# Patient Record
Sex: Male | Born: 1975 | Race: White | Hispanic: No | Marital: Married | Smoking: Former smoker
Health system: Southern US, Community
[De-identification: ages and names within clinical notes are randomized; demographics above are authoritative.]

## PROBLEM LIST (undated history)

## (undated) DIAGNOSIS — J189 Pneumonia, unspecified organism: Secondary | ICD-10-CM

## (undated) DIAGNOSIS — U070 Vaping-related disorder: Secondary | ICD-10-CM

## (undated) DIAGNOSIS — Z87891 Personal history of nicotine dependence: Secondary | ICD-10-CM

## (undated) DIAGNOSIS — G4730 Sleep apnea, unspecified: Secondary | ICD-10-CM

## (undated) DIAGNOSIS — C801 Malignant (primary) neoplasm, unspecified: Secondary | ICD-10-CM

## (undated) HISTORY — DX: Personal history of nicotine dependence: Z87.891

## (undated) HISTORY — PX: WISDOM TOOTH EXTRACTION: SHX21

## (undated) HISTORY — DX: Sleep apnea, unspecified: G47.30

## (undated) HISTORY — DX: Vaping-related disorder: U07.0

---

## 2006-04-09 ENCOUNTER — Emergency Department (HOSPITAL_COMMUNITY): Admission: EM | Admit: 2006-04-09 | Discharge: 2006-04-09 | Payer: Self-pay | Admitting: Family Medicine

## 2020-01-09 DIAGNOSIS — C801 Malignant (primary) neoplasm, unspecified: Secondary | ICD-10-CM

## 2020-01-09 HISTORY — DX: Malignant (primary) neoplasm, unspecified: C80.1

## 2020-02-22 ENCOUNTER — Other Ambulatory Visit: Payer: Self-pay | Admitting: Nurse Practitioner

## 2020-02-22 ENCOUNTER — Telehealth: Payer: Self-pay

## 2020-02-22 ENCOUNTER — Other Ambulatory Visit: Payer: Self-pay

## 2020-02-22 ENCOUNTER — Ambulatory Visit (INDEPENDENT_AMBULATORY_CARE_PROVIDER_SITE_OTHER): Payer: 59 | Admitting: Nurse Practitioner

## 2020-02-22 ENCOUNTER — Telehealth: Payer: Self-pay | Admitting: Nurse Practitioner

## 2020-02-22 ENCOUNTER — Encounter: Payer: Self-pay | Admitting: Nurse Practitioner

## 2020-02-22 VITALS — BP 122/78 | HR 60 | Temp 97.5°F | Ht 71.0 in | Wt 272.0 lb

## 2020-02-22 DIAGNOSIS — R5383 Other fatigue: Secondary | ICD-10-CM

## 2020-02-22 DIAGNOSIS — F17200 Nicotine dependence, unspecified, uncomplicated: Secondary | ICD-10-CM

## 2020-02-22 DIAGNOSIS — Z823 Family history of stroke: Secondary | ICD-10-CM

## 2020-02-22 DIAGNOSIS — Z87898 Personal history of other specified conditions: Secondary | ICD-10-CM | POA: Diagnosis not present

## 2020-02-22 DIAGNOSIS — R918 Other nonspecific abnormal finding of lung field: Secondary | ICD-10-CM

## 2020-02-22 DIAGNOSIS — R9389 Abnormal findings on diagnostic imaging of other specified body structures: Secondary | ICD-10-CM | POA: Diagnosis not present

## 2020-02-22 DIAGNOSIS — Z801 Family history of malignant neoplasm of trachea, bronchus and lung: Secondary | ICD-10-CM

## 2020-02-22 DIAGNOSIS — Z0000 Encounter for general adult medical examination without abnormal findings: Secondary | ICD-10-CM

## 2020-02-22 DIAGNOSIS — R911 Solitary pulmonary nodule: Secondary | ICD-10-CM

## 2020-02-22 DIAGNOSIS — R030 Elevated blood-pressure reading, without diagnosis of hypertension: Secondary | ICD-10-CM

## 2020-02-22 DIAGNOSIS — R002 Palpitations: Secondary | ICD-10-CM

## 2020-02-22 DIAGNOSIS — Z820 Family history of epilepsy and other diseases of the nervous system: Secondary | ICD-10-CM

## 2020-02-22 NOTE — Telephone Encounter (Signed)
Received call from [facility] about results. Pt had chest xray today. 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 A CT Chest is recommended.

## 2020-02-22 NOTE — Addendum Note (Signed)
Addended by: [name] on: 02/22/2020 08:11 PM   Modules accepted: Orders

## 2020-02-22 NOTE — Patient Instructions (Addendum)
Chest x-ray today at [facility] We will call you with lab and x-ray results Follow-up pending x-ray and lab results Vaping cessation encouraged   Preventive Care 58-43 Years Old, Male Preventive care refers to lifestyle choices and visits with your health care provider that can promote health and wellness. This includes:  A yearly physical exam. This is also called an annual wellness visit.  Regular dental and eye exams.  Immunizations.  Screening for certain conditions.  Healthy lifestyle choices, such as: ? Eating a healthy diet. ? Getting regular exercise. ? Not using drugs or products that contain nicotine and tobacco. ? Limiting alcohol use. What can I expect for my preventive care visit? Physical exam Your health care provider will check your:  Height and weight. These may be used to calculate your BMI (body mass index). BMI is a measurement that tells if you are at a healthy weight.  Heart rate and blood pressure.  Body temperature.  Skin for abnormal spots. Counseling Your health care provider may ask you questions about your:  Past medical problems.  Family's medical history.  Alcohol, tobacco, and drug use.  Emotional well-being.  Home life and relationship well-being.  Sexual activity.  Diet, exercise, and sleep habits.  Work and work Statistician.  Access to firearms. What immunizations do I need? Vaccines are usually given at various ages, according to a schedule. Your health care provider will recommend vaccines for you based on your age, medical history, and lifestyle or other factors, such as travel or where you work.   What tests do I need? Blood tests  Lipid and cholesterol levels. These may be checked every 5 years, or more often if you are over 63 years old.  Hepatitis C test.  Hepatitis B test. Screening  Lung cancer screening. You may have this screening every year starting at age 27 if you have a 30-pack-year history of  smoking and currently smoke or have quit within the past 15 years.  Prostate cancer screening. Recommendations will vary depending on your family history and other risks.  Genital exam to check for testicular cancer or hernias.  Colorectal cancer screening. ? All adults should have this screening starting at age 76 and continuing until age 21. ? Your health care provider may recommend screening at age 21 if you are at increased risk. ? You will have tests every 1-10 years, depending on your results and the type of screening test.  Diabetes screening. ? This is done by checking your blood sugar (glucose) after you have not eaten for a while (fasting). ? You may have this done every 1-3 years.  STD (sexually transmitted disease) testing, if you are at risk. Follow these instructions at home: Eating and drinking  Eat a diet that includes fresh fruits and vegetables, whole grains, lean protein, and low-fat dairy products.  Take vitamin and mineral supplements as recommended by your health care provider.  Do not drink alcohol if your health care provider tells you not to drink.  If you drink alcohol: ? Limit how much you have to 0-2 drinks a day. ? Be aware of how much alcohol is in your drink. In the [place] one drink equals one 12 oz bottle of beer (355 mL), one 5 oz glass of wine (148 mL), or one 1 oz glass of hard liquor (44 mL).   Lifestyle  Take daily care of your teeth and gums. Brush your teeth every morning and night with fluoride toothpaste. Floss one time each  day.  Stay active. Exercise for at least 30 minutes 5 or more days each week.  Do not use any products that contain nicotine or tobacco, such as cigarettes, e-cigarettes, and chewing tobacco. If you need help quitting, ask your health care provider.  Do not use drugs.  If you are sexually active, practice safe sex. Use a condom or other form of protection to prevent STIs (sexually transmitted infections).  If told  by your health care provider, take low-dose aspirin daily starting at age 4.  Find healthy ways to cope with stress, such as: ? Meditation, yoga, or listening to music. ? Journaling. ? Talking to a trusted person. ? Spending time with friends and family. Safety  Always wear your seat belt while driving or riding in a vehicle.  Do not drive: ? If you have been drinking alcohol. Do not ride with someone who has been drinking. ? When you are tired or distracted. ? While texting.  Wear a helmet and other protective equipment during sports activities.  If you have firearms in your house, make sure you follow all gun safety procedures. What's next?  Go to your health care provider once a year for an annual wellness visit.  Ask your health care provider how often you should have your eyes and teeth checked.  Stay up to date on all vaccines. This information is not intended to replace advice given to you by your health care provider. Make sure you discuss any questions you have with your health care provider. Document Revised: 09/23/2018 Document Reviewed: 12/19/2017 Elsevier Patient Education  2021 [place] Maintenance, Male Adopting a healthy lifestyle and getting preventive care are important in promoting health and wellness. Ask your health care provider about:  The right schedule for you to have regular tests and exams.  Things you can do on your own to prevent diseases and keep yourself healthy. What should I know about diet, weight, and exercise? Eat a healthy diet  Eat a diet that includes plenty of vegetables, fruits, low-fat dairy products, and lean protein.  Do not eat a lot of foods that are high in solid fats, added sugars, or sodium.   Maintain a healthy weight Body mass index (BMI) is a measurement that can be used to identify possible weight problems. It estimates body fat based on height and weight. Your health care provider can help determine your BMI  and help you achieve or maintain a healthy weight. Get regular exercise Get regular exercise. This is one of the most important things you can do for your health. Most adults should:  Exercise for at least 150 minutes each week. The exercise should increase your heart rate and make you sweat (moderate-intensity exercise).  Do strengthening exercises at least twice a week. This is in addition to the moderate-intensity exercise.  Spend less time sitting. Even light physical activity can be beneficial. Watch cholesterol and blood lipids Have your blood tested for lipids and cholesterol at 43 years of age, then have this test every 5 years. You may need to have your cholesterol levels checked more often if:  Your lipid or cholesterol levels are high.  You are older than 43 years of age.  You are at high risk for heart disease. What should I know about cancer screening? Many types of cancers can be detected early and may often be prevented. Depending on your health history and family history, you may need to have cancer screening at various ages. This may  include screening for:  Colorectal cancer.  Prostate cancer.  Skin cancer.  Lung cancer. What should I know about heart disease, diabetes, and high blood pressure? Blood pressure and heart disease  High blood pressure causes heart disease and increases the risk of stroke. This is more likely to develop in people who have high blood pressure readings, are of African descent, or are overweight.  Talk with your health care provider about your target blood pressure readings.  Have your blood pressure checked: ? Every 3-5 years if you are 72-58 years of age. ? Every year if you are 73 years old or older.  If you are between the ages of 65 and 32 and are a current or former smoker, ask your health care provider if you should have a one-time screening for abdominal aortic aneurysm (AAA). Diabetes Have regular diabetes screenings. This  checks your fasting blood sugar level. Have the screening done:  Once every three years after age 58 if you are at a normal weight and have a low risk for diabetes.  More often and at a younger age if you are overweight or have a high risk for diabetes. What should I know about preventing infection? Hepatitis B If you have a higher risk for hepatitis B, you should be screened for this virus. Talk with your health care provider to find out if you are at risk for hepatitis B infection. Hepatitis C Blood testing is recommended for:  Everyone born from [birth year] through 1963.  Anyone with known risk factors for hepatitis C. Sexually transmitted infections (STIs)  You should be screened each year for STIs, including gonorrhea and chlamydia, if: ? You are sexually active and are younger than 43 years of age. ? You are older than 43 years of age and your health care provider tells you that you are at risk for this type of infection. ? Your sexual activity has changed since you were last screened, and you are at increased risk for chlamydia or gonorrhea. Ask your health care provider if you are at risk.  Ask your health care provider about whether you are at high risk for HIV. Your health care provider may recommend a prescription medicine to help prevent HIV infection. If you choose to take medicine to prevent HIV, you should first get tested for HIV. You should then be tested every 3 months for as long as you are taking the medicine. Follow these instructions at home: Lifestyle  Do not use any products that contain nicotine or tobacco, such as cigarettes, e-cigarettes, and chewing tobacco. If you need help quitting, ask your health care provider.  Do not use street drugs.  Do not share needles.  Ask your health care provider for help if you need support or information about quitting drugs. Alcohol use  Do not drink alcohol if your health care provider tells you not to drink.  If you drink  alcohol: ? Limit how much you have to 0-2 drinks a day. ? Be aware of how much alcohol is in your drink. In the [place] one drink equals one 12 oz bottle of beer (355 mL), one 5 oz glass of wine (148 mL), or one 1 oz glass of hard liquor (44 mL). General instructions  Schedule regular health, dental, and eye exams.  Stay current with your vaccines.  Tell your health care provider if: ? You often feel depressed. ? You have ever been abused or do not feel safe at home. Summary  Adopting a  healthy lifestyle and getting preventive care are important in promoting health and wellness.  Follow your health care provider's instructions about healthy diet, exercising, and getting tested or screened for diseases.  Follow your health care provider's instructions on monitoring your cholesterol and blood pressure. This information is not intended to replace advice given to you by your health care provider. Make sure you discuss any questions you have with your health care provider. Document Revised: 12/18/2017 Document Reviewed: 12/18/2017 Elsevier Patient Education  [place] Cigarette Information  Electronic cigarettes, or e-cigarettes, are battery-operated devices that deliver nicotine-a very addictive drug-to the body. They come in many shapes, including in the shape of a cigarette, pipe, pen, and even a USB memory stick. E-cigarettes have a cartridge that contains a liquid form of nicotine. When a person uses the device, the liquid heats up. It then becomes a vapor. Inhaling this vapor is called vaping. Nicotine is thought to increase your risk for certain types of cancer. In addition to nicotine, e-cigarettes may contain other harmful and cancer-causing chemicals, including:  Formaldehyde.  Acetaldehyde.  Heavy metals.  Ultra-fine particles that can get inhaled deep into the lungs.  Chemical colorings and flavorings. It is not clear how much nicotine you get when  vaping, and it is hard to know what chemicals are in the vaping liquids. The health effects of vaping are not completely known, but you should be aware of the possible dangers of using these products. Some people may use e-cigarettes in order to quit smoking tobacco. However, this has not been proven to work, and the Transport planner (FDA) has not approved e-cigarettes for this purpose. How can using electronic cigarettes affect me?  You may be at risk for developing a dangerous lung disease. There are reports of an increasing number of cases involving serious lung problems, and even death, associated with e-cigarette use. Your risk may be even higher if you: ? Buy e-cigarettes or vaping oils off the street. ? Add any substances to the e-cigarettes that are not intended by the manufacturer.  Vaping may make you crave nicotine. Nicotine does the following: ? Changes your blood sugar levels. ? Increases your heart rate, blood pressure, and breathing rate. ? Increases your risk of developing blood clots (hypercoagulable state) and diabetes. ? Increases your risk of gum disease that may lead to losing teeth.  If you smoke e-cigarettes, you may be more likely to start smoking or to smoke more tobacco cigarettes.  Becoming addicted to nicotine may make your brain more sensitive to other addictive drugs. You may move to other addictive substances.  You may be in danger of overdosing on nicotine. Nicotine poisoning can cause nausea, vomiting, seizures, and trouble breathing.  An e-cigarette may explode and cause fires and burn injuries.  If you are pregnant, the nicotine in e-cigarettes may be harmful to your baby. Nicotine can cause: ? Brain or lung problems for your baby. ? Your baby to be born too early. ? Your baby to be born with a low birth weight.  Vaping has also been linked to decreases in memory and attention span in children and teens. What actions can I take to stop  vaping? If you can, stop vaping on your own before you become addicted to nicotine. If you need help quitting, ask your health care provider. There are three effective ways to fight nicotine addiction:  Nicotine replacement therapy. Using nicotine gum or a nicotine patch blocks your craving for  nicotine. Over time, you can reduce the amount of nicotine you use until you can stop using nicotine completely without having cravings.  Prescription medicines approved to fight nicotine addiction. These stop nicotine cravings or block the effects of nicotine.  Behavioral therapy. This may include: ? A self-help smoking cessation program. ? Individual or group therapy. ? A smoking cessation support group. There are several national programs to help you quit smoking or vaping. These include:  Text message programs, such as SmokefreeTXT.  Apps for mobile phones, including the free quitSTART app.  Hotlines, such as 1-800-QUIT-NOW 612-086-2375). Where to find support You can get support at these sites:  [place].S. Department of Health and Human Services: https://smokefree.gov  American Lung Association: www.lung.org Where to find more information Learn more about e-cigarettes from:  [employer] on Drug Abuse: motorcyclefax.com  Centers for Disease Control and Prevention: http://www.wolf.info/ Summary  E-cigarettes can cause nicotine addiction.  E-cigarettes are not approved as a way to stop smoking. They are not a risk-free alternative to smoking tobacco.  There are reports of an increasing number of cases involving serious lung problems, and even death, associated with e-cigarette use.  If you can stop vaping on your own, do it before you become addicted to nicotine. If you need help quitting, ask your health care provider.  There are various methods and programs that can help you stop smoking or vaping. This information is not intended to replace advice given to you by your health care  provider. Make sure you discuss any questions you have with your health care provider. Document Revised: 04/22/2018 Document Reviewed: 03/07/2018 Elsevier Patient Education  2021 [place].  Chest X-Ray A chest X-ray is a test that uses radiation to create pictures of the organs in your chest, including the lungs, the heart, and the ribs. Chest X-rays are used to look for many health conditions, including heart failure, pneumonia, tuberculosis, rib fractures, breathing disorders, and cancer. They may be used to diagnose chest pain, constant coughing, or trouble breathing. Tell a health care provider about:  Any allergies you have.  All medicines you are taking, including vitamins, herbs, eye drops, creams, and over-the-counter medicines.  Any surgeries you have had.  Any medical conditions you have.  Whether you are pregnant or may be pregnant. What are the risks? Getting a chest X-ray is a safe procedure. However, you will be exposed to a small amount of radiation. Being exposed to too much radiation over a lifetime can increase the risk of cancer. This risk is small, but it may occur if you have many X-rays throughout your life. What happens before the procedure?  You may be asked to remove glasses, jewelry, and any other metal objects.  You will be asked to undress from the waist up. You may be given a hospital gown to wear.  You may be asked to wear a protective lead apron to protect other parts of your body from radiation. What happens during the procedure?  You will be asked to stand still as each picture is taken. This ensures that good pictures are taken.  You will be asked to take a deep breath and hold it for a few seconds.  The X-ray machine will create a picture of your chest using a tiny burst of radiation. This is painless.  More pictures may be taken from other angles. Typically, one picture will be taken while you face the X-ray camera, and another picture will  be taken from the side while  you stand. If you cannot stand, you may be asked to lie down. The procedure may vary among health care providers and hospitals.   What can I expect after procedure?  The X-ray will be reviewed by your health care provider or an X-ray specialist (radiologist).  You may return to your normal, everyday life, including diet, activities, and medicines, unless your health care provider tells you not to do that.  It is up to you to get your test results. Ask your health care provider, or the department that is doing the procedure, when your results will be ready.  Your health care provider will tell you if you need more tests or a follow-up exam. Keep all follow-up visits. This is important. Summary  A chest X-ray is a safe, painless test that uses radiation to create pictures of the organs inside your chest, including the lungs, heart, and ribs.  You will need to undress from the waist up and remove jewelry and metal objects before the procedure.  You will be exposed to a small amount of radiation during the procedure.  The X-ray machine will take one or more pictures of your chest while you remain as still as possible.  Later, a health care provider or specialist will review the test results with you. This information is not intended to replace advice given to you by your health care provider. Make sure you discuss any questions you have with your health care provider. Document Revised: 03/27/2019 Document Reviewed: 03/27/2019 Elsevier Patient Education  2021 [place].  Stroke Prevention Some medical conditions and behaviors are associated with a higher chance of having a stroke. You can help prevent a stroke by making nutrition, lifestyle, and other changes, including managing any medical conditions you may have. What nutrition changes can be made?  Eat healthy foods. You can do this by: ? Choosing foods high in fiber, such as fresh fruits and vegetables  and whole grains. ? Eating at least 5 or more servings of fruits and vegetables a day. Try to fill half of your plate at each meal with fruits and vegetables. ? Choosing lean protein foods, such as lean cuts of meat, poultry without skin, fish, tofu, beans, and nuts. ? Eating low-fat dairy products. ? Avoiding foods that are high in salt (sodium). This can help lower blood pressure. ? Avoiding foods that have saturated fat, trans fat, and cholesterol. This can help prevent high cholesterol. ? Avoiding processed and premade foods.  Follow your health care provider's specific guidelines for losing weight, controlling high blood pressure (hypertension), lowering high cholesterol, and managing diabetes. These may include: ? Reducing your daily calorie intake. ? Limiting your daily sodium intake to 1,500 milligrams (mg). ? Using only healthy fats for cooking, such as olive oil, canola oil, or sunflower oil. ? Counting your daily carbohydrate intake.   What lifestyle changes can be made?  Maintain a healthy weight. Talk to your health care provider about your ideal weight.  Get at least 30 minutes of moderate physical activity at least 5 days a week. Moderate activity includes brisk walking, biking, and swimming.  Do not use any products that contain nicotine or tobacco, such as cigarettes and e-cigarettes. If you need help quitting, ask your health care provider. It may also be helpful to avoid exposure to secondhand smoke.  Limit alcohol intake to no more than 1 drink a day for nonpregnant women and 2 drinks a day for men. One drink equals 12 oz of beer,  5 oz of wine, or 1 oz of hard liquor.  Stop any illegal drug use.  Avoid taking birth control pills. Talk to your health care provider about the risks of taking birth control pills if: ? You are over 15 years old. ? You smoke. ? You get migraines. ? You have ever had a blood clot. What other changes can be made?  Manage your cholesterol  levels. ? Eating a healthy diet is important for preventing high cholesterol. If cholesterol cannot be managed through diet alone, you may also need to take medicines. ? Take any prescribed medicines to control your cholesterol as told by your health care provider.  Manage your diabetes. ? Eating a healthy diet and exercising regularly are important parts of managing your blood sugar. If your blood sugar cannot be managed through diet and exercise, you may need to take medicines. ? Take any prescribed medicines to control your diabetes as told by your health care provider.  Control your hypertension. ? To reduce your risk of stroke, try to keep your blood pressure below 130/80. ? Eating a healthy diet and exercising regularly are an important part of controlling your blood pressure. If your blood pressure cannot be managed through diet and exercise, you may need to take medicines. ? Take any prescribed medicines to control hypertension as told by your health care provider. ? Ask your health care provider if you should monitor your blood pressure at home. ? Have your blood pressure checked every year, even if your blood pressure is normal. Blood pressure increases with age and some medical conditions.  Get evaluated for sleep disorders (sleep apnea). Talk to your health care provider about getting a sleep evaluation if you snore a lot or have excessive sleepiness.  Take over-the-counter and prescription medicines only as told by your health care provider. Aspirin or blood thinners (antiplatelets or anticoagulants) may be recommended to reduce your risk of forming blood clots that can lead to stroke.  Make sure that any other medical conditions you have, such as atrial fibrillation or atherosclerosis, are managed. What are the warning signs of a stroke? The warning signs of a stroke can be easily remembered as BEFAST.  B is for balance. Signs include: ? Dizziness. ? Loss of balance or  coordination. ? Sudden trouble walking.  E is for eyes. Signs include: ? A sudden change in vision. ? Trouble seeing.  F is for face. Signs include: ? Sudden weakness or numbness of the face. ? The face or eyelid drooping to one side.  A is for arms. Signs include: ? Sudden weakness or numbness of the arm, usually on one side of the body.  S is for speech. Signs include: ? Trouble speaking (aphasia). ? Trouble understanding.  T is for time. ? These symptoms may represent a serious problem that is an emergency. Do not wait to see if the symptoms will go away. Get medical help right away. Call your local emergency services (911 in the [place] Do not drive yourself to the hospital.  Other signs of stroke may include: ? A sudden, severe headache with no known cause. ? Nausea or vomiting. ? Seizure. Where to find more information For more information, visit:  American Stroke Association: www.strokeassociation.org  National Stroke Association: www.stroke.org Summary  You can prevent a stroke by eating healthy, exercising, not smoking, limiting alcohol intake, and managing any medical conditions you may have.  Do not use any products that contain nicotine or tobacco, such as cigarettes  and e-cigarettes. If you need help quitting, ask your health care provider. It may also be helpful to avoid exposure to secondhand smoke.  Remember BEFAST for warning signs of stroke. Get help right away if you or a loved one has any of these signs. This information is not intended to replace advice given to you by your health care provider. Make sure you discuss any questions you have with your health care provider. Document Revised: 12/07/2016 Document Reviewed: 01/31/2016 Elsevier Patient Education  2021 [employer].  Syncope Syncope is when you pass out (faint) for a short time. It is caused by a sudden decrease in blood flow to the brain. Signs that you may be about to pass out  include: Feeling dizzy or light-headed. Feeling sick to your stomach (nauseous). Seeing all white or all black. Having cold, clammy skin. If you pass out, get help right away. Call your local emergency services (911 in the [place] Do not drive yourself to the hospital. Follow these instructions at home: Watch for any changes in your symptoms. Take these actions to stay safe and help with your symptoms: Lifestyle Do not drive, use machinery, or play sports until your doctor says it is okay. Do not drink alcohol. Do not use any products that contain nicotine or tobacco, such as cigarettes and e-cigarettes. If you need help quitting, ask your doctor. Drink enough fluid to keep your pee (urine) pale yellow. General instructions Take over-the-counter and prescription medicines only as told by your doctor. If you are taking blood pressure or heart medicine, sit up and stand up slowly. Spend a few minutes getting ready to sit and then stand. This can help you feel less dizzy. Have someone stay with you until you feel stable. If you start to feel like you might pass out, lie down right away and raise (elevate) your feet above the level of your heart. Breathe deeply and steadily. Wait until all of the symptoms are gone. Keep all follow-up visits as told by your doctor. This is important. Get help right away if: You have a very bad headache. You pass out once or more than once. You have pain in your chest, belly, or back. You have a very fast or uneven heartbeat (palpitations). It hurts to breathe. You are bleeding from your mouth or your bottom (rectum). You have black or tarry poop (stool). You have jerky movements that you cannot control (seizure). You are confused. You have trouble walking. You are very weak. You have vision problems. These symptoms may be an emergency. Do not wait to see if the symptoms will go away. Get medical help right away. Call your local emergency services (911 in  the [place] Do not drive yourself to the hospital. Summary Syncope is when you pass out (faint) for a short time. It is caused by a sudden decrease in blood flow to the brain. Signs that you may be about to faint include feeling dizzy, light-headed, or sick to your stomach, seeing all white or all black, or having cold, clammy skin. If you start to feel like you might pass out, lie down right away and raise (elevate) your feet above the level of your heart. Breathe deeply and steadily. Wait until all of the symptoms are gone. This information is not intended to replace advice given to you by your health care provider. Make sure you discuss any questions you have with your health care provider. Document Revised: 02/04/2019 Document Reviewed: 02/06/2017 Elsevier Patient Education  2021  [employer].

## 2020-02-22 NOTE — Progress Notes (Addendum)
New Patient Office Visit  Subjective:  Patient ID: Jerry [name], male    DOB: 11/27/1975  Age: 45 y.o. MRN: [id number]  CC:  Chief Complaint  Patient presents with  . New to establish    Had employer physical at work and was told to follow up with [place] on Chest XRAY that they had provided him the results with stating that he had a 4.5cm mass in the right lung.    HPI Jerry [name] presents to establish care.He is accompanied by his spouse. He recently had a physical exam at his place of employment that included a routine chest x-ray. He was notified 1-week ago that chest x-ray was abnormal. Pt states he was told a 4.5 cm mass was noted to right lung. He is a former long-time cigarette smoker and current daily nicotine vape user. He has a family history of lung cancer (paternal aunt). He tells me he also has experienced intermittent palpitations with "chest tightness" at rest that last a few seconds. He denies dyspnea during episodes. His spouse states they have been increasing in frequency. He states he has had two syncopal episodes during bowel movements over the past couple years. [place] tells me that his father has had several strokes and tias beginning around age 29.    .  Family History  Problem Relation Age of Onset  . Stroke Father   . Hypertension Father   . Hyperlipidemia Father   . Arrhythmia Father   . Lung cancer Paternal Aunt     Social History   Socioeconomic History  . Marital status: Married    Spouse name: Not on file  . Number of children: Not on file  . Years of education: Not on file  . Highest education level: Not on file  Occupational History  . Occupation: Clinical biochemist  Tobacco Use  . Smoking status: Former Smoker    Packs/day: 1.00    Types: Cigarettes  . Smokeless tobacco: Never Used  Vaping Use  . Vaping Use: Every day  . Start date: 02/08/2013  Substance and Sexual Activity  . 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  . Drug use: Never   . Sexual activity: Not on file  Other Topics Concern  . Not on file  Social History Narrative  . Not on file   Social Determinants of Health   Financial Resource Strain: Not on file  Food Insecurity: Not on file  Transportation Needs: Not on file  Physical Activity: Not on file  Stress: Not on file  Social Connections: Not on file  Intimate Partner Violence: Not on file    ROS Review of Systems  Constitutional: Positive for fatigue. Negative for activity change and fever.  HENT: Negative for ear pain, postnasal drip and sore throat.   Cardiovascular: Positive for chest pain ("chest tightness"), palpitations (intermittent) and leg swelling (intermittent mild swelling to ankles).  Gastrointestinal: Negative for abdominal pain, constipation, diarrhea, nausea and vomiting.  Endocrine: Negative for cold intolerance, heat intolerance, polydipsia, polyphagia and polyuria.  Genitourinary: Negative for dysuria and hematuria.  Musculoskeletal: Negative for arthralgias, back pain, joint swelling and myalgias.  Skin: Negative.   Allergic/Immunologic: Negative for environmental allergies.  Neurological: Positive for syncope (twice in past 2 years during BM). Negative for dizziness.  Psychiatric/Behavioral: Negative for dysphoric mood and suicidal ideas. The patient is not nervous/anxious.     Objective:   Today's Vitals: BP 122/78 (BP Location: Left Arm, Patient Position: Sitting)   Pulse  60   Temp (!) 97.5 F (36.4 C) (Temporal)   Ht 5\' 11"  (1.803 m)   Wt 272 lb (123.4 kg)   SpO2 99%   BMI 37.94 kg/m   Physical Exam Vitals reviewed.  Constitutional:      Appearance: Normal appearance.  HENT:     Head: Normocephalic.     Right Ear: Tympanic membrane normal.     Left Ear: Tympanic membrane normal.     Nose: Nose normal.     Mouth/Throat:     Mouth: Mucous membranes are moist.  Eyes:     Pupils: Pupils are equal, round, and reactive to light.  Cardiovascular:     Rate and  Rhythm: Regular rhythm. Bradycardia present.     Pulses: Normal pulses.     Heart sounds: Normal heart sounds.  Pulmonary:     Effort: Pulmonary effort is normal.     Breath sounds: Normal breath sounds.  Abdominal:     General: Bowel sounds are normal.     Palpations: Abdomen is soft.  Musculoskeletal:        General: Normal range of motion.     Cervical back: Neck supple.  Skin:    General: Skin is warm and dry.     Capillary Refill: Capillary refill takes less than 2 seconds.  Neurological:     Mental Status: He is alert and oriented to person, place, and time.  Psychiatric:        Mood and Affect: Mood normal.        Behavior: Behavior normal.     Assessment & Plan:   1. Mass of upper lobe of right lung -CT of chest with contrast  2. History of syncope - EKG 12-Lead  3. Abnormal chest x-ray - DG Chest 2 View  4. Elevated BP without diagnosis of hypertension  5. Encounter for medical examination to establish care - CBC with Differential/Platelet - Comprehensive metabolic panel - Lipid panel - TSH  6. Smoker - DG Chest 2 View  7. Other fatigue - Testosterone,Free and Total  8. Palpitations  9. Family history of lung cancer  10. Family history of TIAs  11. Family history of cerebrovascular accident (CVA) in father   Problem List Items Addressed This Visit      Visit Diagnoses       No outpatient encounter medications on file as of 02/22/2020.   No facility-administered encounter medications on file as of 02/22/2020.   Chest x-ray today at [facility] We will call you with lab and x-ray results Follow-up pending x-ray and lab results Vaping cessation encouraged   Follow-up: Return in about 3 months (around 05/21/2020), pending test results   [facility] radiology confirmed right upper lung mass 4.4 cm. CT of chest with contrast ordered for 02/23/20. Will refer to pulmonology.    [name], NP

## 2020-02-22 NOTE — Telephone Encounter (Signed)
Called patient to notify him 4.4 cm mass to right upper lobe confirmed with chest x-ray today. We will set up lung CT tomorrow.

## 2020-02-23 ENCOUNTER — Telehealth: Payer: Self-pay | Admitting: Nurse Practitioner

## 2020-02-23 DIAGNOSIS — R918 Other nonspecific abnormal finding of lung field: Secondary | ICD-10-CM

## 2020-02-23 LAB — COMPREHENSIVE METABOLIC PANEL
ALT: 9 IU/L (ref 0–44)
AST: 15 IU/L (ref 0–40)
Albumin/Globulin Ratio: 2.8 — ABNORMAL HIGH (ref 1.2–2.2)
Albumin: 4.8 g/dL (ref 4.0–5.0)
Alkaline Phosphatase: 48 IU/L (ref 44–121)
BUN/Creatinine Ratio: 11 (ref 9–20)
BUN: 11 mg/dL (ref 6–24)
Bilirubin Total: 0.4 mg/dL (ref 0.0–1.2)
CO2: 23 mmol/L (ref 20–29)
Calcium: 9.4 mg/dL (ref 8.7–10.2)
Chloride: 103 mmol/L (ref 96–106)
Creatinine, Ser: 0.99 mg/dL (ref 0.76–1.27)
GFR calc Af Amer: 108 mL/min/{1.73_m2} (ref >59)
GFR calc non Af Amer: 94 mL/min/{1.73_m2} (ref >59)
Globulin, Total: 1.7 g/dL (ref 1.5–4.5)
Glucose: 96 mg/dL (ref 65–99)
Potassium: 4.6 mmol/L (ref 3.5–5.2)
Sodium: 142 mmol/L (ref 134–144)
Total Protein: 6.5 g/dL (ref 6.0–8.5)

## 2020-02-23 LAB — CBC WITH DIFFERENTIAL/PLATELET
Basophils Absolute: 0 10*3/uL (ref 0.0–0.2)
Basos: 1 %
EOS (ABSOLUTE): 0 10*3/uL (ref 0.0–0.4)
Eos: 1 %
Hematocrit: 43.3 % (ref 37.5–51.0)
Hemoglobin: 14.8 g/dL (ref 13.0–17.7)
Immature Grans (Abs): 0 10*3/uL (ref 0.0–0.1)
Immature Granulocytes: 0 %
Lymphocytes Absolute: 0.9 10*3/uL (ref 0.7–3.1)
Lymphs: 21 %
MCH: 30.8 pg (ref 26.6–33.0)
MCHC: 34.2 g/dL (ref 31.5–35.7)
MCV: 90 fL (ref 79–97)
Monocytes Absolute: 0.4 10*3/uL (ref 0.1–0.9)
Monocytes: 9 %
Neutrophils Absolute: 3.1 10*3/uL (ref 1.4–7.0)
Neutrophils: 68 %
Platelets: 220 10*3/uL (ref 150–450)
RBC: 4.8 x10E6/uL (ref 4.14–5.80)
RDW: 11.8 % (ref 11.6–15.4)
WBC: 4.5 10*3/uL (ref 3.4–10.8)

## 2020-02-23 LAB — TESTOSTERONE,FREE AND TOTAL
Testosterone, Free: 9.8 pg/mL (ref 6.8–21.5)
Testosterone: 597 ng/dL (ref 264–916)

## 2020-02-23 LAB — CARDIOVASCULAR RISK ASSESSMENT

## 2020-02-23 LAB — LIPID PANEL
Chol/HDL Ratio: 4.4 ratio (ref 0.0–5.0)
Cholesterol, Total: 192 mg/dL (ref 100–199)
HDL: 44 mg/dL (ref >39)
LDL Chol Calc (NIH): 133 mg/dL — ABNORMAL HIGH (ref 0–99)
Triglycerides: 81 mg/dL (ref 0–149)
VLDL Cholesterol Cal: 15 mg/dL (ref 5–40)

## 2020-02-23 LAB — TSH: TSH: 0.509 u[IU]/mL (ref 0.450–4.500)

## 2020-02-23 NOTE — Telephone Encounter (Signed)
Called patient with chest CT results with right upper lobe lung mass worrisome for a primary lung neoplasm. Urgent pulmonology referral sent. PET-CT ordered per radiology recommendation. Questions answered for patient and spouse.

## 2020-02-29 ENCOUNTER — Encounter: Payer: Self-pay | Admitting: Pulmonary Disease

## 2020-02-29 ENCOUNTER — Other Ambulatory Visit: Payer: Self-pay

## 2020-02-29 ENCOUNTER — Ambulatory Visit: Payer: 59 | Admitting: Pulmonary Disease

## 2020-02-29 ENCOUNTER — Telehealth: Payer: Self-pay | Admitting: Pulmonary Disease

## 2020-02-29 VITALS — BP 126/78 | HR 88 | Temp 97.8°F | Ht 71.0 in | Wt 278.5 lb

## 2020-02-29 DIAGNOSIS — Z720 Tobacco use: Secondary | ICD-10-CM | POA: Diagnosis not present

## 2020-02-29 DIAGNOSIS — R918 Other nonspecific abnormal finding of lung field: Secondary | ICD-10-CM | POA: Diagnosis not present

## 2020-02-29 DIAGNOSIS — R590 Localized enlarged lymph nodes: Secondary | ICD-10-CM | POA: Diagnosis not present

## 2020-02-29 NOTE — H&P (View-Only) (Signed)
Synopsis: Referred in February 2022 for abnormal CT chest, lung mass by [name], NP  Subjective:   PATIENT ID: Jerry Jerry [name] GENDER: male DOB: [birth year], MRN: [id number]  Chief Complaint  Patient presents with  . Consult    Lung mass from CT scan.  Scheduled for PET on 3/3. Has had a cough for a couple of days. Having some yellow sputum with the cough    45 year old Jerry [name], past medical history former smoker, switched to vaping use in 2015.  Patient had a chest x-ray which showed right upper lobe abnormality and patient underwent CT scan imaging completed at [facility].  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  Patient was referred to pulmonary for evaluation of the lung mass.  OV 02/29/2020: Patient has had no recent sick contacts.  Denies fevers.  No sputum production.  Denies hemoptysis but does have persistent cough that the wife says has been present for the past month.  Very anxious about recent findings found on CT scan.  He did state that the abnormality was found on chest x-ray from a routine screening that is done due to his work.  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   Past Medical History:  Diagnosis Date  . 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 Former smoker      Family History  Problem Relation Age of Onset  . Stroke Father   . Hypertension Father   . Hyperlipidemia Father   . Arrhythmia Father   . Lung cancer Paternal Aunt      History reviewed. No pertinent surgical history.  Social History   Socioeconomic History  . Marital status: Married    Spouse name: Not on file  . Number of children: Not on file  . Years of education: Not on file  . Highest education level: Not on file  Occupational History  . Occupation: Clinical biochemist  Tobacco Use  . Smoking status: Former Smoker    Packs/day: 1.00    Years: 17.00    Pack  years: 17.00    Types: Cigarettes    Start date: 03/01/1991    Quit date: 02/29/2008    Years since quitting: 12.0  . Smokeless tobacco: Never Used  . Tobacco comment: still using vape  Vaping Use  . Vaping Use: Every day  . Start date: 02/08/2013  . Substances: Nicotine  Substance and Sexual Activity  . 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  . Drug use: Never  . Sexual activity: Not on file  Other Topics Concern  . Not on file  Social History Narrative  . Not on file   Social Determinants of Health   Financial Resource Strain: Not on file  Food Insecurity: Not on file  Transportation Needs: Not on file  Physical Activity: Not on file  Stress: Not on file  Social Connections: Not on file  Intimate Partner Violence: Not on file     No Known Allergies   No outpatient medications prior to visit.   No facility-administered medications prior to visit.    Review of Systems  Constitutional: Negative for chills, fever, malaise/fatigue and weight loss.  HENT: Negative for hearing loss, sore throat and tinnitus.   Eyes: Negative for blurred vision and double vision.  Respiratory: Negative for cough, hemoptysis, sputum production, shortness of breath, wheezing and stridor.  Cardiovascular: Negative for chest pain, palpitations, orthopnea, leg swelling and PND.  Gastrointestinal: Negative for abdominal pain, constipation, diarrhea, heartburn, nausea and vomiting.  Genitourinary: Negative for dysuria, hematuria and urgency.  Musculoskeletal: Negative for joint pain and myalgias.  Skin: Negative for itching and rash.  Neurological: Negative for dizziness, tingling, weakness and headaches.  Endo/Heme/Allergies: Negative for environmental allergies. Does not bruise/bleed easily.  Psychiatric/Behavioral: Negative for depression. The patient is not nervous/anxious and does not have insomnia.   All other systems reviewed and are negative.    Objective:  Physical  Exam Vitals reviewed.  Constitutional:      General: He is not in acute distress.    Appearance: He is well-developed and well-nourished. He is obese.  HENT:     Head: Normocephalic and atraumatic.     Mouth/Throat:     Mouth: Oropharynx is clear and moist.  Eyes:     General: No scleral icterus.    Conjunctiva/sclera: Conjunctivae normal.     Pupils: Pupils are equal, round, and reactive to light.  Neck:     Vascular: No JVD.     Trachea: No tracheal deviation.  Cardiovascular:     Rate and Rhythm: Normal rate and regular rhythm.     Pulses: Intact distal pulses.     Heart sounds: Normal heart sounds. No murmur heard.   Pulmonary:     Effort: Pulmonary effort is normal. No tachypnea, accessory muscle usage or respiratory distress.     Breath sounds: Normal breath sounds. No stridor. No wheezing, rhonchi or rales.  Abdominal:     General: Bowel sounds are normal. There is no distension.     Palpations: Abdomen is soft.     Tenderness: There is no abdominal tenderness.  Musculoskeletal:        General: No tenderness or edema.     Cervical back: Neck supple.  Lymphadenopathy:     Cervical: No cervical adenopathy.  Skin:    General: Skin is warm and dry.     Capillary Refill: Capillary refill takes less than 2 seconds.     Findings: No rash.  Neurological:     Mental Status: He is alert and oriented to person, place, and time.  Psychiatric:        Mood and Affect: Mood and affect normal.        Behavior: Behavior normal.      Vitals:   02/29/20 0954  BP: 126/78  Pulse: 88  Temp: 97.8 F (36.6 C)  TempSrc: Tympanic  SpO2: 98%  Weight: 278 lb 8 oz (126.3 kg)  Height: 5\' 11"  (1.803 m)   98% on RA BMI Readings from Last 3 Encounters:  02/29/20 38.84 kg/m  02/22/20 37.94 kg/m   Wt Readings from Last 3 Encounters:  02/29/20 278 lb 8 oz (126.3 kg)  02/22/20 272 lb (123.4 kg)     CBC    Component Value Date/Time   WBC 4.5 02/22/2020 0948   RBC 4.80  02/22/2020 0948   HGB 14.8 02/22/2020 0948   HCT 43.3 02/22/2020 0948   PLT 220 02/22/2020 0948   MCV 90 02/22/2020 0948   MCH Jerry.8 02/22/2020 0948   MCHC 34.2 02/22/2020 0948   RDW 11.8 02/22/2020 0948   LYMPHSABS 0.9 02/22/2020 0948   EOSABS 0.0 02/22/2020 0948   BASOSABS 0.0 02/22/2020 0948    Chest Imaging:  CT chest 02/23/2020: Right upper lobe lung mass 5 x 3 cm concerning for primary bronchogenic carcinoma. The patient's images have  been independently reviewed by me.      Pulmonary Functions Testing Results: No flowsheet data found.  FeNO:   Pathology:   Echocardiogram:   Heart Catheterization:     Assessment & Plan:     ICD-10-CM   1. Mass of upper lobe of right lung  R91.8 Ambulatory referral to Pulmonology    CT Super D Chest Wo Contrast    Procedural/ Surgical Case Request: VIDEO BRONCHOSCOPY WITH ENDOBRONCHIAL NAVIGATION, VIDEO BRONCHOSCOPY WITH ENDOBRONCHIAL ULTRASOUND  2. Tobacco abuse  Z72.0 Procedural/ Surgical Case Request: VIDEO BRONCHOSCOPY WITH ENDOBRONCHIAL NAVIGATION, VIDEO BRONCHOSCOPY WITH ENDOBRONCHIAL ULTRASOUND  3. Mediastinal adenopathy  R59.0     Discussion:  This is a 45 year old Jerry [name], history of tobacco abuse, abnormal CT imaging of the chest concerning for a 5 cm right upper lobe lung mass.  Images are concerning for a primary bronchogenic carcinoma.  Plan: Today in the office we discussed the risk benefits and alternatives of proceeding with bronchoscopy invasive tissue diagnosis to include navigational bronchoscopy and endobronchial ultrasound with transbronchial needle aspirations. We would like to do this next available for tissue sampling. His CT scan images were completed at [facility] and we will need to have a repeat super D CT imaging to plan for procedure. Tentative procedure date scheduled for 03/02/2020.  No current outpatient medications on file.  I spent 60 minutes dedicated to the care of this patient on  the date of this encounter to include pre-visit review of records, face-to-face time with the patient discussing conditions above, post visit ordering of testing, clinical documentation with the electronic health record, making appropriate referrals as documented, and communicating necessary findings to members of the patients care team.   Garner Nash, DO [facility] 02/29/2020 10:43 AM

## 2020-02-29 NOTE — Progress Notes (Signed)
Synopsis: Referred in February 2022 for abnormal CT chest, lung mass by [name], NP  Subjective:   PATIENT ID: Jerry [name] GENDER: male DOB: 1975-05-23, MRN: [id number]  Chief Complaint  Patient presents with  . Consult    Lung mass from CT scan.  Scheduled for PET on 3/3. Has had a cough for a couple of days. Having some yellow sputum with the cough    45 year old gentleman, past medical history former smoker, switched to vaping use in 2015.  Patient had a chest x-ray which showed right upper lobe abnormality and patient underwent CT scan imaging completed at [facility].  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  Patient was referred to pulmonary for evaluation of the lung mass.  OV 02/29/2020: Patient has had no recent sick contacts.  Denies fevers.  No sputum production.  Denies hemoptysis but does have persistent cough that the wife says has been present for the past month.  Very anxious about recent findings found on CT scan.  He did state that the abnormality was found on chest x-ray from a routine screening that is done due to his work.  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   Past Medical History:  Diagnosis Date  . 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 Former smoker      Family History  Problem Relation Age of Onset  . Stroke Father   . Hypertension Father   . Hyperlipidemia Father   . Arrhythmia Father   . Lung cancer Paternal Aunt      History reviewed. No pertinent surgical history.  Social History   Socioeconomic History  . Marital status: Married    Spouse name: Not on file  . Number of children: Not on file  . Years of education: Not on file  . Highest education level: Not on file  Occupational History  . Occupation: Clinical biochemist  Tobacco Use  . Smoking status: Former Smoker    Packs/day: 1.00    Years: 17.00    Pack  years: 17.00    Types: Cigarettes    Start date: 03/01/1991    Quit date: 02/29/2008    Years since quitting: 12.0  . Smokeless tobacco: Never Used  . Tobacco comment: still using vape  Vaping Use  . Vaping Use: Every day  . Start date: 02/08/2013  . Substances: Nicotine  Substance and Sexual Activity  . 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  . Drug use: Never  . Sexual activity: Not on file  Other Topics Concern  . Not on file  Social History Narrative  . Not on file   Social Determinants of Health   Financial Resource Strain: Not on file  Food Insecurity: Not on file  Transportation Needs: Not on file  Physical Activity: Not on file  Stress: Not on file  Social Connections: Not on file  Intimate Partner Violence: Not on file     No Known Allergies   No outpatient medications prior to visit.   No facility-administered medications prior to visit.    Review of Systems  Constitutional: Negative for chills, fever, malaise/fatigue and weight loss.  HENT: Negative for hearing loss, sore throat and tinnitus.   Eyes: Negative for blurred vision and double vision.  Respiratory: Negative for cough, hemoptysis, sputum production, shortness of breath, wheezing and stridor.  Cardiovascular: Negative for chest pain, palpitations, orthopnea, leg swelling and PND.  Gastrointestinal: Negative for abdominal pain, constipation, diarrhea, heartburn, nausea and vomiting.  Genitourinary: Negative for dysuria, hematuria and urgency.  Musculoskeletal: Negative for joint pain and myalgias.  Skin: Negative for itching and rash.  Neurological: Negative for dizziness, tingling, weakness and headaches.  Endo/Heme/Allergies: Negative for environmental allergies. Does not bruise/bleed easily.  Psychiatric/Behavioral: Negative for depression. The patient is not nervous/anxious and does not have insomnia.   All other systems reviewed and are negative.    Objective:  Physical  Exam Vitals reviewed.  Constitutional:      General: He is not in acute distress.    Appearance: He is well-developed and well-nourished. He is obese.  HENT:     Head: Normocephalic and atraumatic.     Mouth/Throat:     Mouth: Oropharynx is clear and moist.  Eyes:     General: No scleral icterus.    Conjunctiva/sclera: Conjunctivae normal.     Pupils: Pupils are equal, round, and reactive to light.  Neck:     Vascular: No JVD.     Trachea: No tracheal deviation.  Cardiovascular:     Rate and Rhythm: Normal rate and regular rhythm.     Pulses: Intact distal pulses.     Heart sounds: Normal heart sounds. No murmur heard.   Pulmonary:     Effort: Pulmonary effort is normal. No tachypnea, accessory muscle usage or respiratory distress.     Breath sounds: Normal breath sounds. No stridor. No wheezing, rhonchi or rales.  Abdominal:     General: Bowel sounds are normal. There is no distension.     Palpations: Abdomen is soft.     Tenderness: There is no abdominal tenderness.  Musculoskeletal:        General: No tenderness or edema.     Cervical back: Neck supple.  Lymphadenopathy:     Cervical: No cervical adenopathy.  Skin:    General: Skin is warm and dry.     Capillary Refill: Capillary refill takes less than 2 seconds.     Findings: No rash.  Neurological:     Mental Status: He is alert and oriented to person, place, and time.  Psychiatric:        Mood and Affect: Mood and affect normal.        Behavior: Behavior normal.      Vitals:   02/29/20 0954  BP: 126/78  Pulse: 88  Temp: 97.8 F (36.6 C)  TempSrc: Tympanic  SpO2: 98%  Weight: 278 lb 8 oz (126.3 kg)  Height: 5\' 11"  (1.803 m)   98% on RA BMI Readings from Last 3 Encounters:  02/29/20 38.84 kg/m  02/22/20 37.94 kg/m   Wt Readings from Last 3 Encounters:  02/29/20 278 lb 8 oz (126.3 kg)  02/22/20 272 lb (123.4 kg)     CBC    Component Value Date/Time   WBC 4.5 02/22/2020 0948   RBC 4.80  02/22/2020 0948   HGB 14.8 02/22/2020 0948   HCT 43.3 02/22/2020 0948   PLT 220 02/22/2020 0948   MCV 90 02/22/2020 0948   MCH 30.8 02/22/2020 0948   MCHC 34.2 02/22/2020 0948   RDW 11.8 02/22/2020 0948   LYMPHSABS 0.9 02/22/2020 0948   EOSABS 0.0 02/22/2020 0948   BASOSABS 0.0 02/22/2020 0948    Chest Imaging:  CT chest 02/23/2020: Right upper lobe lung mass 5 x 3 cm concerning for primary bronchogenic carcinoma. The patient's images have  been independently reviewed by me.      Pulmonary Functions Testing Results: No flowsheet data found.  FeNO:   Pathology:   Echocardiogram:   Heart Catheterization:     Assessment & Plan:     ICD-10-CM   1. Mass of upper lobe of right lung  R91.8 Ambulatory referral to Pulmonology    CT Super D Chest Wo Contrast    Procedural/ Surgical Case Request: VIDEO BRONCHOSCOPY WITH ENDOBRONCHIAL NAVIGATION, VIDEO BRONCHOSCOPY WITH ENDOBRONCHIAL ULTRASOUND  2. Tobacco abuse  Z72.0 Procedural/ Surgical Case Request: VIDEO BRONCHOSCOPY WITH ENDOBRONCHIAL NAVIGATION, VIDEO BRONCHOSCOPY WITH ENDOBRONCHIAL ULTRASOUND  3. Mediastinal adenopathy  R59.0     Discussion:  This is a 45 year old gentleman, history of tobacco abuse, abnormal CT imaging of the chest concerning for a 5 cm right upper lobe lung mass.  Images are concerning for a primary bronchogenic carcinoma.  Plan: Today in the office we discussed the risk benefits and alternatives of proceeding with bronchoscopy invasive tissue diagnosis to include navigational bronchoscopy and endobronchial ultrasound with transbronchial needle aspirations. We would like to do this next available for tissue sampling. His CT scan images were [facility] at [facility] and we will need to have a repeat super D CT imaging to plan for procedure. Tentative procedure date scheduled for 03/02/2020.  No current outpatient medications on file.  I spent 60 minutes dedicated to the care of this patient on  the date of this encounter to include pre-visit review of records, face-to-face time with the patient discussing conditions above, post visit ordering of testing, clinical documentation with the electronic health record, making appropriate referrals as documented, and communicating necessary findings to members of the patients care team.   Garner Nash, DO [facility] 02/29/2020 10:43 AM

## 2020-02-29 NOTE — Patient Instructions (Addendum)
Thank you for visiting Dr. Valeta Harms at [facility]. Today we recommend the following:  Orders Placed This Encounter  Procedures  . Procedural/ Surgical Case Request: VIDEO BRONCHOSCOPY WITH ENDOBRONCHIAL NAVIGATION, VIDEO BRONCHOSCOPY WITH ENDOBRONCHIAL ULTRASOUND  . CT Super D Chest Wo Contrast  . Ambulatory referral to Pulmonology   Nothing to eat or drink the night before the procedure past midnight.   Return in about 6 weeks (around 04/11/2020) for with APP or Dr. Valeta Harms. Follow up with me once we get final results.     Please do your part to reduce the spread of COVID-19.

## 2020-02-29 NOTE — Telephone Encounter (Signed)
I scheduled pt for 2/23 at 2:45 in [facility] OR.  Larene Beach states may be able to move over to Endo day of.  CT scheduled for tomorrow at 10:45 at [place].  Nunzio Cobbs is going to have disk sent by courier STAT to [facility].  Pt to have covid test tomorrow at 11:45.  I spoke to pt as he was driving home from our office.  He is going to call me when he gets home to get appt info.

## 2020-02-29 NOTE — Telephone Encounter (Signed)
I have spoken to pt & gave him appt info.

## 2020-03-01 ENCOUNTER — Other Ambulatory Visit: Payer: Self-pay

## 2020-03-01 ENCOUNTER — Encounter (HOSPITAL_COMMUNITY): Payer: Self-pay | Admitting: Pulmonary Disease

## 2020-03-01 ENCOUNTER — Other Ambulatory Visit (HOSPITAL_COMMUNITY)
Admission: RE | Admit: 2020-03-01 | Discharge: 2020-03-01 | Disposition: A | Discharge status: Home / Self Care | Payer: 59 | Source: Ambulatory Visit | Attending: Pulmonary Disease | Admitting: Pulmonary Disease

## 2020-03-01 ENCOUNTER — Ambulatory Visit (INDEPENDENT_AMBULATORY_CARE_PROVIDER_SITE_OTHER)
Admission: RE | Admit: 2020-03-01 | Discharge: 2020-03-01 | Disposition: A | Discharge status: Home / Self Care | Payer: 59 | Source: Ambulatory Visit | Attending: Pulmonary Disease | Admitting: Pulmonary Disease

## 2020-03-01 DIAGNOSIS — R918 Other nonspecific abnormal finding of lung field: Secondary | ICD-10-CM

## 2020-03-01 DIAGNOSIS — Z01812 Encounter for preprocedural laboratory examination: Secondary | ICD-10-CM | POA: Insufficient documentation

## 2020-03-01 DIAGNOSIS — Z20822 Contact with and (suspected) exposure to covid-19: Secondary | ICD-10-CM | POA: Insufficient documentation

## 2020-03-01 LAB — SARS CORONAVIRUS 2 (TAT 6-24 HRS): SARS Coronavirus 2: NEGATIVE

## 2020-03-01 NOTE — Progress Notes (Signed)
Jerry [name] denies chest pain or shortness of breath. Patient was tested for Covid today and has been in quarantine with his family.  Jerry [name] stopped vaping with nicotine today, patient is using nicotine gum instead.I instructed patient to not chew gum after midnight.

## 2020-03-02 ENCOUNTER — Other Ambulatory Visit: Payer: Self-pay

## 2020-03-02 ENCOUNTER — Ambulatory Visit (HOSPITAL_COMMUNITY): Payer: 59 | Admitting: Anesthesiology

## 2020-03-02 ENCOUNTER — Ambulatory Visit (HOSPITAL_COMMUNITY)
Admission: RE | Admit: 2020-03-02 | Discharge: 2020-03-02 | Disposition: A | Discharge status: Home / Self Care | Payer: 59 | Attending: Pulmonary Disease | Admitting: Pulmonary Disease

## 2020-03-02 ENCOUNTER — Ambulatory Visit (HOSPITAL_COMMUNITY): Payer: 59

## 2020-03-02 ENCOUNTER — Institutional Professional Consult (permissible substitution): Payer: 59 | Admitting: Pulmonary Disease

## 2020-03-02 ENCOUNTER — Encounter (HOSPITAL_COMMUNITY): Payer: Self-pay | Admitting: Pulmonary Disease

## 2020-03-02 ENCOUNTER — Encounter (HOSPITAL_COMMUNITY)
Admission: RE | Disposition: A | Discharge status: Home / Self Care | Payer: Self-pay | Source: Home / Self Care | Attending: Pulmonary Disease

## 2020-03-02 DIAGNOSIS — Z87891 Personal history of nicotine dependence: Secondary | ICD-10-CM | POA: Diagnosis not present

## 2020-03-02 DIAGNOSIS — R590 Localized enlarged lymph nodes: Secondary | ICD-10-CM | POA: Diagnosis not present

## 2020-03-02 DIAGNOSIS — R918 Other nonspecific abnormal finding of lung field: Secondary | ICD-10-CM

## 2020-03-02 DIAGNOSIS — C3411 Malignant neoplasm of upper lobe, right bronchus or lung: Secondary | ICD-10-CM | POA: Diagnosis not present

## 2020-03-02 DIAGNOSIS — Z419 Encounter for procedure for purposes other than remedying health state, unspecified: Secondary | ICD-10-CM

## 2020-03-02 DIAGNOSIS — Z720 Tobacco use: Secondary | ICD-10-CM

## 2020-03-02 DIAGNOSIS — Z9889 Other specified postprocedural states: Secondary | ICD-10-CM

## 2020-03-02 HISTORY — PX: VIDEO BRONCHOSCOPY WITH ENDOBRONCHIAL NAVIGATION: SHX6175

## 2020-03-02 HISTORY — PX: VIDEO BRONCHOSCOPY WITH ENDOBRONCHIAL ULTRASOUND: SHX6177

## 2020-03-02 HISTORY — DX: Pneumonia, unspecified organism: J18.9

## 2020-03-02 SURGERY — VIDEO BRONCHOSCOPY WITH ENDOBRONCHIAL NAVIGATION
Anesthesia: General | Laterality: Right

## 2020-03-02 MED ORDER — DEXAMETHASONE SODIUM PHOSPHATE 10 MG/ML IJ SOLN
INTRAMUSCULAR | Status: AC
  Filled 2020-03-02: qty 1

## 2020-03-02 MED ORDER — PROMETHAZINE HCL 25 MG/ML IJ SOLN: 6.2500 mg | INTRAMUSCULAR | Status: DC | PRN

## 2020-03-02 MED ORDER — LACTATED RINGERS IV SOLN: INTRAVENOUS | Status: DC

## 2020-03-02 MED ORDER — ONDANSETRON HCL 4 MG/2ML IJ SOLN
INTRAMUSCULAR | Status: DC | PRN
  Administered 2020-03-02: 4 mg via INTRAVENOUS

## 2020-03-02 MED ORDER — SUGAMMADEX SODIUM 200 MG/2ML IV SOLN
INTRAVENOUS | Status: DC | PRN
  Administered 2020-03-02: 200 mg via INTRAVENOUS

## 2020-03-02 MED ORDER — ROCURONIUM BROMIDE 10 MG/ML (PF) SYRINGE
PREFILLED_SYRINGE | INTRAVENOUS | Status: AC
  Filled 2020-03-02: qty 10

## 2020-03-02 MED ORDER — FENTANYL CITRATE (PF) 100 MCG/2ML IJ SOLN: 25.0000 ug | INTRAMUSCULAR | Status: DC | PRN

## 2020-03-02 MED ORDER — ACETAMINOPHEN 500 MG PO TABS
1000.0000 mg | ORAL_TABLET | Freq: Once | ORAL | Status: AC
  Administered 2020-03-02: 1000 mg via ORAL
  Filled 2020-03-02: qty 2

## 2020-03-02 MED ORDER — PROPOFOL 10 MG/ML IV BOLUS
INTRAVENOUS | Status: DC | PRN
  Administered 2020-03-02: 200 mg via INTRAVENOUS

## 2020-03-02 MED ORDER — ROCURONIUM BROMIDE 10 MG/ML (PF) SYRINGE
PREFILLED_SYRINGE | INTRAVENOUS | Status: DC | PRN
  Administered 2020-03-02 (×2): 20 mg via INTRAVENOUS
  Administered 2020-03-02: 50 mg via INTRAVENOUS

## 2020-03-02 MED ORDER — MIDAZOLAM HCL 2 MG/2ML IJ SOLN
INTRAMUSCULAR | Status: DC | PRN
  Administered 2020-03-02: 2 mg via INTRAVENOUS

## 2020-03-02 MED ORDER — ONDANSETRON HCL 4 MG/2ML IJ SOLN
INTRAMUSCULAR | Status: AC
  Filled 2020-03-02: qty 2

## 2020-03-02 MED ORDER — MIDAZOLAM HCL 2 MG/2ML IJ SOLN
INTRAMUSCULAR | Status: AC
  Filled 2020-03-02: qty 2

## 2020-03-02 MED ORDER — ORAL CARE MOUTH RINSE: 15.0000 mL | Freq: Once | OROMUCOSAL | Status: AC

## 2020-03-02 MED ORDER — PHENYLEPHRINE HCL-NACL 10-0.9 MG/250ML-% IV SOLN
INTRAVENOUS | Status: DC | PRN
  Administered 2020-03-02: 40 ug/min via INTRAVENOUS

## 2020-03-02 MED ORDER — LIDOCAINE 2% (20 MG/ML) 5 ML SYRINGE
INTRAMUSCULAR | Status: DC | PRN
  Administered 2020-03-02: 100 mg via INTRAVENOUS

## 2020-03-02 MED ORDER — FENTANYL CITRATE (PF) 250 MCG/5ML IJ SOLN
INTRAMUSCULAR | Status: AC
  Filled 2020-03-02: qty 5

## 2020-03-02 MED ORDER — CHLORHEXIDINE GLUCONATE 0.12 % MT SOLN
15.0000 mL | Freq: Once | OROMUCOSAL | Status: AC
  Administered 2020-03-02: 15 mL via OROMUCOSAL
  Filled 2020-03-02: qty 15

## 2020-03-02 MED ORDER — 0.9 % SODIUM CHLORIDE (POUR BTL) OPTIME
TOPICAL | Status: DC | PRN
  Administered 2020-03-02: 1000 mL

## 2020-03-02 MED ORDER — LIDOCAINE 2% (20 MG/ML) 5 ML SYRINGE
INTRAMUSCULAR | Status: AC
  Filled 2020-03-02: qty 5

## 2020-03-02 MED ORDER — DEXAMETHASONE SODIUM PHOSPHATE 10 MG/ML IJ SOLN
INTRAMUSCULAR | Status: DC | PRN
  Administered 2020-03-02: 10 mg via INTRAVENOUS

## 2020-03-02 MED ORDER — FENTANYL CITRATE (PF) 250 MCG/5ML IJ SOLN
INTRAMUSCULAR | Status: DC | PRN
  Administered 2020-03-02: 50 ug via INTRAVENOUS
  Administered 2020-03-02: 100 ug via INTRAVENOUS

## 2020-03-02 SURGICAL SUPPLY — 54 items
ADAPTER BRONCHOSCOPE OLYMPUS (ADAPTER) ×4 IMPLANT
ADAPTER VALVE BIOPSY EBUS (MISCELLANEOUS) IMPLANT
ADPR BSCP OLMPS EDG (ADAPTER) ×2
ADPTR VALVE BIOPSY EBUS (MISCELLANEOUS)
BRUSH CYTOL CELLEBRITY 1.5X140 (MISCELLANEOUS) ×4 IMPLANT
BRUSH SUPERTRAX BIOPSY (INSTRUMENTS) IMPLANT
BRUSH SUPERTRAX NDL-TIP CYTO (INSTRUMENTS) ×4 IMPLANT
CANISTER SUCT 3000ML PPV (MISCELLANEOUS) ×4 IMPLANT
CNTNR URN SCR LID CUP LEK RST (MISCELLANEOUS) ×2 IMPLANT
CONT SPEC 4OZ STRL OR WHT (MISCELLANEOUS) ×4
COVER BACK TABLE 60X90IN (DRAPES) ×4 IMPLANT
COVER WAND RF STERILE (DRAPES) ×4 IMPLANT
FILTER STRAW FLUID ASPIR (MISCELLANEOUS) IMPLANT
FORCEPS BIOP RJ4 1.8 (CUTTING FORCEPS) IMPLANT
FORCEPS BIOP SUPERTRX PREMAR (INSTRUMENTS) ×4 IMPLANT
GAUZE SPONGE 4X4 12PLY STRL (GAUZE/BANDAGES/DRESSINGS) ×4 IMPLANT
GLOVE SURG SS PI 7.5 STRL IVOR (GLOVE) ×8 IMPLANT
GOWN STRL REUS W/ TWL LRG LVL3 (GOWN DISPOSABLE) ×4 IMPLANT
GOWN STRL REUS W/TWL LRG LVL3 (GOWN DISPOSABLE) ×8
KIT CLEAN ENDO COMPLIANCE (KITS) ×8 IMPLANT
KIT ILLUMISITE 180 PROCEDURE (KITS) IMPLANT
KIT ILLUMISITE 90 PROCEDURE (KITS) ×2 IMPLANT
KIT LOCATABLE GUIDE (CANNULA) IMPLANT
KIT MARKER FIDUCIAL DELIVERY (KITS) IMPLANT
KIT TURNOVER KIT B (KITS) ×4 IMPLANT
MARKER SKIN DUAL TIP RULER LAB (MISCELLANEOUS) ×4 IMPLANT
NDL ARCPOINT PULMONARY 18GA (NEEDLE) IMPLANT
NDL ASPIRATION VIZISHOT 19G (NEEDLE) IMPLANT
NDL ASPIRATION VIZISHOT 21G (NEEDLE) IMPLANT
NDL SUPERTRX PREMARK BIOPSY (NEEDLE) ×2 IMPLANT
NEEDLE ARCPOINT PULMONARY 18GA (NEEDLE) ×4 IMPLANT
NEEDLE ASPIRATION VIZISHOT 19G (NEEDLE) IMPLANT
NEEDLE ASPIRATION VIZISHOT 21G (NEEDLE) ×4 IMPLANT
NEEDLE SUPERTRX PREMARK BIOPSY (NEEDLE) ×4 IMPLANT
NS IRRIG 1000ML POUR BTL (IV SOLUTION) ×4 IMPLANT
OIL SILICONE PENTAX (PARTS (SERVICE/REPAIRS)) ×4 IMPLANT
PAD ARMBOARD 7.5X6 YLW CONV (MISCELLANEOUS) ×8 IMPLANT
PATCHES PATIENT (LABEL) ×12 IMPLANT
STOPCOCK 4 WAY LG BORE MALE ST (IV SETS) ×4 IMPLANT
SYR 20ML ECCENTRIC (SYRINGE) ×12 IMPLANT
SYR 20ML LL LF (SYRINGE) ×4 IMPLANT
SYR 3ML LL SCALE MARK (SYRINGE) IMPLANT
SYR 50ML SLIP (SYRINGE) ×4 IMPLANT
SYR 5ML LL (SYRINGE) ×16 IMPLANT
TOWEL GREEN STERILE FF (TOWEL DISPOSABLE) ×4 IMPLANT
TRAP SPECIMEN MUCUS 40CC (MISCELLANEOUS) IMPLANT
TUBE CONNECTING 20'X1/4 (TUBING) ×1
TUBE CONNECTING 20X1/4 (TUBING) ×3 IMPLANT
TUBING EXTENTION W/L.L. (IV SETS) ×4 IMPLANT
UNDERPAD 30X36 HEAVY ABSORB (UNDERPADS AND DIAPERS) ×4 IMPLANT
VALVE BIOPSY  SINGLE USE (MISCELLANEOUS) ×4
VALVE BIOPSY SINGLE USE (MISCELLANEOUS) ×2 IMPLANT
VALVE SUCTION BRONCHIO DISP (MISCELLANEOUS) ×4 IMPLANT
WATER STERILE IRR 1000ML POUR (IV SOLUTION) ×4 IMPLANT

## 2020-03-02 NOTE — Interval H&P Note (Signed)
History and Physical Interval Note:  03/02/2020 1:35 PM  Jerry [name]  has presented today for surgery, with the diagnosis of lung mass.  The various methods of treatment have been discussed with the patient and family. After consideration of risks, benefits and other options for treatment, the patient has consented to  Procedure(s): VIDEO BRONCHOSCOPY WITH ENDOBRONCHIAL NAVIGATION (Right) VIDEO BRONCHOSCOPY WITH ENDOBRONCHIAL ULTRASOUND (Bilateral) as a surgical intervention.  The patient's history has been reviewed, patient examined, no change in status, stable for surgery.  I have reviewed the patient's chart and labs.  Questions were answered to the patient's satisfaction.     [place]

## 2020-03-02 NOTE — Anesthesia Postprocedure Evaluation (Signed)
Anesthesia Post Note  Patient: Jerry [name]  Procedure(s) Performed: VIDEO BRONCHOSCOPY WITH ENDOBRONCHIAL NAVIGATION (Right ) VIDEO BRONCHOSCOPY WITH ENDOBRONCHIAL ULTRASOUND (Bilateral )     Patient location during evaluation: PACU Anesthesia Type: General Level of consciousness: awake and alert Pain management: pain level controlled Vital Signs Assessment: post-procedure vital signs reviewed and stable Respiratory status: spontaneous breathing, nonlabored ventilation, respiratory function stable and patient connected to nasal cannula oxygen Cardiovascular status: blood pressure returned to baseline and stable Postop Assessment: no apparent nausea or vomiting Anesthetic complications: no   No complications documented.  Last Vitals:  Vitals:   03/02/20 1640 03/02/20 1650  BP: 114/63 (!) 112/56  Pulse: 70 74  Resp: 14 20  Temp:    SpO2: 96% 97%    Last Pain:  Vitals:   03/02/20 1650  TempSrc:   PainSc: 0-No pain                 Catalina Gravel

## 2020-03-02 NOTE — Anesthesia Preprocedure Evaluation (Addendum)
Anesthesia Evaluation  Patient identified by MRN, date of birth, ID band Patient awake    Reviewed: Allergy & Precautions, NPO status , Patient's Chart, lab work & pertinent test results  Airway Mallampati: II  TM Distance: >3 FB Neck ROM: Full    Dental  (+) Teeth Intact, Dental Advisory Given, Chipped,    Pulmonary Patient abstained from smoking., former smoker,  History of vaping   Pulmonary exam normal breath sounds clear to auscultation       Cardiovascular negative cardio ROS Normal cardiovascular exam Rhythm:Regular Rate:Normal     Neuro/Psych negative neurological ROS     GI/Hepatic negative GI ROS, Neg liver ROS,   Endo/Other  Obesity   Renal/GU negative Renal ROS     Musculoskeletal negative musculoskeletal ROS (+)   Abdominal   Peds  Hematology negative hematology ROS (+)   Anesthesia Other Findings Day of surgery medications reviewed with the patient.  Reproductive/Obstetrics                           Anesthesia Physical Anesthesia Plan  ASA: II  Anesthesia Plan: General   Post-op Pain Management:    Induction: Intravenous  PONV Risk Score and Plan: 3 and Midazolam, Dexamethasone and Ondansetron  Airway Management Planned: Oral ETT  Additional Equipment:   Intra-op Plan:   Post-operative Plan: Extubation in OR  Informed Consent: I have reviewed the patients History and Physical, chart, labs and discussed the procedure including the risks, benefits and alternatives for the proposed anesthesia with the patient or authorized representative who has indicated his/her understanding and acceptance.     Dental advisory given  Plan Discussed with: CRNA  Anesthesia Plan Comments:         Anesthesia Quick Evaluation

## 2020-03-02 NOTE — Op Note (Signed)
Video Bronchoscopy with Electromagnetic Navigation Procedure Note Video Bronchoscopy with Endobronchial Ultrasound Procedure Note  Date of Operation: 03/02/2020  Pre-op Diagnosis: Right upper lobe lung mass  Post-op Diagnosis: Right upper lobe lung mass  Surgeon: Garner Nash, DO  Assistants: NOne  Anesthesia: General endotracheal anesthesia  Operation: Flexible video fiberoptic bronchoscopy with electromagnetic navigation and biopsies.  Estimated Blood Loss: Minimal  Complications: None   Indications and History: Jerry [name] is a 45 y.o. male with right upper lobe lung mass.  The risks, benefits, complications, treatment options and expected outcomes were discussed with the patient.  The possibilities of pneumothorax, pneumonia, reaction to medication, pulmonary aspiration, perforation of a viscus, bleeding, failure to diagnose a condition and creating a complication requiring transfusion or operation were discussed with the patient who freely signed the consent.    Description of Procedure: The patient was seen in the Preoperative Area, was examined and was deemed appropriate to proceed.  The patient was taken to [facility] OR room 11, identified as Jerry [name] and the procedure verified as Flexible Video Fiberoptic Bronchoscopy.  A Time Out was held and the above information confirmed.   Prior to the date of the procedure a high-resolution CT scan of the chest was performed. Utilizing [place] a virtual tracheobronchial tree was generated to allow the creation of distinct navigation pathways to the patient's parenchymal abnormalities. After being taken to the operating room general anesthesia was initiated and the patient  was orally intubated. The video fiberoptic bronchoscope was introduced via the endotracheal tube and a general inspection was performed which showed normal right and left lung anatomy no evidence of endobronchial lesion. The extendable working channel and  locator guide were introduced into the bronchoscope. The distinct navigation pathways prepared prior to this procedure were then utilized to navigate to within 0.5 cm of patient's lesion(s) identified on CT scan.  A full fluoroscopic sweep was obtained with inspiratory breath-hold APL at 20 cm of water from RAO 25 degrees to LAO 25 degrees.  The extendable working channel was secured into place and the locator guide was withdrawn. Under fluoroscopic guidance transbronchial needle brushings, transbronchial Wang needle biopsies, and transbronchial forceps biopsies were performed to be sent for cytology and pathology. A bronchioalveolar lavage was performed in the right upper lobe and sent for cytology.  Description of Procedure: The standard scope was then withdrawn and the endobronchial ultrasound was used to identify and characterize the peritracheal, hilar and bronchial lymph nodes. Inspection showed no subcarinal adenopathy left hilum clear, right hilum distal and posteriorly visible portion of 11 R. Using real-time ultrasound guidance Wang needle biopsies were take from Station 11R nodes and were sent for cytology.   Standard therapeutic bronchoscope was reinserted to the patient's airway and bilateral aspiration of the mainstem's were necessary for clearance of remaining blood clots and debris.  All distal subsegments were patent at the termination of procedure.  At the end of the procedure a general airway inspection was performed and there was no evidence of active bleeding. The bronchoscope was removed.  The patient tolerated the procedure well. There was no significant blood loss and there were no obvious complications. A post-procedural chest x-ray is pending.  Endobronchial navigation samples: 1. Transbronchial needle brushings from right upper lobe 2. Transbronchial Wang needle biopsies from right upper lobe 3. Transbronchial forceps biopsies from right upper lobe 4. Bronchoalveolar lavage from  right upper lobe  Endobronchial ultrasound samples: 1. Wang needle biopsies from 11 R node  Plans:  The patient will be discharged from the PACU to home when recovered from anesthesia and after chest x-ray is reviewed. We will review the cytology, pathology and microbiology results with the patient when they become available. Outpatient followup will be with Jerry [name] Jerry [name],DO.    [facility] 03/02/2020 4:10 PM

## 2020-03-02 NOTE — Anesthesia Procedure Notes (Signed)
Procedure Name: Intubation Date/Time: 03/02/2020 2:18 PM Performed by: Candis Shine, CRNA Pre-anesthesia Checklist: Patient identified, Emergency Drugs available, Suction available and Patient being monitored Patient Re-evaluated:Patient Re-evaluated prior to induction Oxygen Delivery Method: Circle System Utilized Preoxygenation: Pre-oxygenation with 100% oxygen Induction Type: IV induction Ventilation: Oral airway inserted - appropriate to patient size and Two handed mask ventilation required Laryngoscope Size: Mac and 4 Grade View: Grade I Tube type: Oral Tube size: 8.5 mm Number of attempts: 1 Airway Equipment and Method: Stylet and Oral airway Placement Confirmation: ETT inserted through vocal cords under direct vision,  positive ETCO2 and breath sounds checked- equal and bilateral Secured at: 23 cm Tube secured with: Tape Dental Injury: Teeth and Oropharynx as per pre-operative assessment

## 2020-03-02 NOTE — Transfer of Care (Signed)
Immediate Anesthesia Transfer of Care Note  Patient: Jerry [name]  Procedure(s) Performed: VIDEO BRONCHOSCOPY WITH ENDOBRONCHIAL NAVIGATION (Right ) VIDEO BRONCHOSCOPY WITH ENDOBRONCHIAL ULTRASOUND (Bilateral )  Patient Location: PACU  Anesthesia Type:General  Level of Consciousness: awake, alert  and oriented  Airway & Oxygen Therapy: Patient Spontanous Breathing and Patient connected to face mask oxygen  Post-op Assessment: Report given to RN and Post -op Vital signs reviewed and stable  Post vital signs: Reviewed and stable  Last Vitals:  Vitals Value Taken Time  BP 113/60 03/02/20 1559  Temp    Pulse 74 03/02/20 1600  Resp 18 03/02/20 1600  SpO2 100 % 03/02/20 1600  Vitals shown include unvalidated device data.  Last Pain:  Vitals:   03/02/20 1249  TempSrc:   PainSc: 0-No pain      Patients Stated Pain Goal: 4 (41/36/43 [id number])  Complications: No complications documented.

## 2020-03-02 NOTE — Discharge Instructions (Signed)
Flexible Bronchoscopy, Care After This sheet gives you information about how to care for yourself after your test. Your doctor may also give you more specific instructions. If you have problems or questions, contact your doctor. Follow these instructions at home: Eating and drinking  Do not eat or drink anything (not even water) for 2 hours after your test, or until your numbing medicine (local anesthetic) wears off.  When your numbness is gone and your cough and gag reflexes have come back, you may: ? Eat only soft foods. ? Slowly drink liquids.  The day after the test, go back to your normal diet. Driving  Do not drive for 24 hours if you were given a medicine to help you relax (sedative).  Do not drive or use heavy machinery while taking prescription pain medicine. General instructions   Take over-the-counter and prescription medicines only as told by your doctor.  Return to your normal activities as told. Ask what activities are safe for you.  Do not use any products that have nicotine or tobacco in them. This includes cigarettes and e-cigarettes. If you need help quitting, ask your doctor.  Keep all follow-up visits as told by your doctor. This is important. It is very important if you had a tissue sample (biopsy) taken. Get help right away if:  You have shortness of breath that gets worse.  You get light-headed.  You feel like you are going to pass out (faint).  You have chest pain.  You cough up: ? More than a little blood. ? More blood than before. Summary  Do not eat or drink anything (not even water) for 2 hours after your test, or until your numbing medicine wears off.  Do not use cigarettes. Do not use e-cigarettes.  Get help right away if you have chest pain.   This information is not intended to replace advice given to you by your health care provider. Make sure you discuss any questions you have with your health care provider. Document Released:  10/22/2008 Document Revised: 12/07/2016 Document Reviewed: 01/13/2016 Elsevier Patient Education  2020 [employer].

## 2020-03-03 ENCOUNTER — Telehealth: Payer: Self-pay | Admitting: Internal Medicine

## 2020-03-03 ENCOUNTER — Encounter (HOSPITAL_COMMUNITY): Payer: Self-pay | Admitting: Pulmonary Disease

## 2020-03-03 NOTE — Telephone Encounter (Signed)
Received a new hem referral from Dr. Valeta Harms for mass of upper lobe of right lung. [name] has been scheduled to see Dr. Julien Nordmann on 3/7 at 2:15pm w/labs at 1:45pm. I cld and left the appt date and time on the pt's vm. Letter mailed.

## 2020-03-04 ENCOUNTER — Telehealth: Payer: Self-pay | Admitting: *Deleted

## 2020-03-04 LAB — SURGICAL PATHOLOGY

## 2020-03-04 LAB — CYTOLOGY - NON PAP

## 2020-03-04 NOTE — Telephone Encounter (Signed)
I called to update on his appt with Dr. Julien Nordmann.  I was unable to reach but did leave vm message with my name and phone number to call if needed.

## 2020-03-04 NOTE — Telephone Encounter (Signed)
Pt had bronch on 2.23.22. Will close encounter.

## 2020-03-07 ENCOUNTER — Telehealth: Payer: Self-pay | Admitting: Pulmonary Disease

## 2020-03-07 ENCOUNTER — Telehealth: Payer: Self-pay | Admitting: *Deleted

## 2020-03-07 LAB — CYTOLOGY - NON PAP

## 2020-03-07 NOTE — Telephone Encounter (Signed)
Left message for the patient I have scheduled the MRI for 03/12/2020 arrive at 4:30 at [facility] no prep

## 2020-03-07 NOTE — Telephone Encounter (Signed)
Forwarding to [facility] since Dr Valeta Harms asking to set up brain MRI and this has been ordered. Thanks!

## 2020-03-07 NOTE — Telephone Encounter (Signed)
I called and updated on appt to see Dr. Julien Nordmann.

## 2020-03-07 NOTE — Telephone Encounter (Signed)
Pt aware of appt.

## 2020-03-07 NOTE — Telephone Encounter (Signed)
Thanks. I tried calling this weekend.  I called and spoke to him this afternoon. He was thankful for the call and he has appts already scheduled for next week.   Can we get his MRI brain scheduled?  Thanks  Garner Nash, DO [facility] 03/07/2020 2:25 PM

## 2020-03-07 NOTE — Telephone Encounter (Signed)
Called and spoke with patient, advised that Dr. Valeta Harms will discuss the biopsy results directly with him.  Advised that Dr. Valeta Harms is currently working in the hospital, but I will advise him that the patient has called regarding the results.  He verbalized understanding.  Dr. Valeta Harms, Patient calling regarding biopsy results.  Results came across [place] and is concerned.  I let him know you would discuss the results with him and that you are currently working in the hospital   Thank you.

## 2020-03-08 ENCOUNTER — Ambulatory Visit: Payer: 59 | Admitting: Internal Medicine

## 2020-03-08 ENCOUNTER — Telehealth: Payer: Self-pay | Admitting: Pulmonary Disease

## 2020-03-08 ENCOUNTER — Other Ambulatory Visit: Payer: 59

## 2020-03-08 ENCOUNTER — Other Ambulatory Visit (HOSPITAL_COMMUNITY): Payer: Self-pay | Admitting: Pulmonary Disease

## 2020-03-08 ENCOUNTER — Telehealth: Payer: Self-pay

## 2020-03-08 DIAGNOSIS — Z1389 Encounter for screening for other disorder: Secondary | ICD-10-CM

## 2020-03-08 NOTE — Telephone Encounter (Signed)
Order placed for xray of eyes prior to MRI. Pt aware.    Will forward to [facility]'s to schedule. Thanks.

## 2020-03-08 NOTE — Telephone Encounter (Signed)
Called and spoke to pt. 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 Called central scheduling and spoke to [facility] and was advised it is protocol to have an xray of the orbits to assure there isnt metal near the eyes prior to the MRI.   Dr. Valeta Harms, please advise if ok to order xray of orbits. Thanks.

## 2020-03-08 NOTE — Telephone Encounter (Signed)
lmtcb for pt.  

## 2020-03-08 NOTE — Telephone Encounter (Signed)
I have left message for the patient to go to [facility] on 03/10/2020 @ 12:30 throught the medical mall and they will do Eye Xray  Before PET scan

## 2020-03-08 NOTE — Telephone Encounter (Signed)
      Patient alternate id for [employer]: [id number] [employer] phone: [phone]  APPROVED  AUTH# [id number] Feb 24, 2020 - May 24, 2020  CPT Code 20601 PET Skull to Thigh, ICD10 R91.8 To be done at [facility]: NPI [id number]   Shelle Iron, LPN 76/14/70 9:29 PM

## 2020-03-08 NOTE — Telephone Encounter (Signed)
Yes please order  Garner Nash, DO [facility] 03/08/2020 10:23 AM

## 2020-03-08 NOTE — Telephone Encounter (Signed)
Patient is returning phone call. Patient phone number is [phone]. May leave detailed message on voicemail.

## 2020-03-09 NOTE — Telephone Encounter (Signed)
Spoke to pt he is aware of the xray appt and will be there Jerry [name]

## 2020-03-10 ENCOUNTER — Ambulatory Visit
Admission: RE | Admit: 2020-03-10 | Discharge: 2020-03-10 | Disposition: A | Discharge status: Home / Self Care | Payer: 59 | Source: Ambulatory Visit | Attending: Pulmonary Disease | Admitting: Pulmonary Disease

## 2020-03-10 ENCOUNTER — Other Ambulatory Visit: Payer: Self-pay

## 2020-03-10 ENCOUNTER — Ambulatory Visit
Admission: RE | Admit: 2020-03-10 | Discharge: 2020-03-10 | Disposition: A | Discharge status: Home / Self Care | Payer: 59 | Source: Ambulatory Visit | Attending: Nurse Practitioner | Admitting: Nurse Practitioner

## 2020-03-10 ENCOUNTER — Other Ambulatory Visit: Payer: Self-pay | Admitting: *Deleted

## 2020-03-10 DIAGNOSIS — Z1389 Encounter for screening for other disorder: Secondary | ICD-10-CM | POA: Diagnosis not present

## 2020-03-10 DIAGNOSIS — J439 Emphysema, unspecified: Secondary | ICD-10-CM | POA: Insufficient documentation

## 2020-03-10 DIAGNOSIS — J984 Other disorders of lung: Secondary | ICD-10-CM | POA: Insufficient documentation

## 2020-03-10 DIAGNOSIS — I700 Atherosclerosis of aorta: Secondary | ICD-10-CM | POA: Insufficient documentation

## 2020-03-10 DIAGNOSIS — R918 Other nonspecific abnormal finding of lung field: Secondary | ICD-10-CM | POA: Insufficient documentation

## 2020-03-10 DIAGNOSIS — I2510 Atherosclerotic heart disease of native coronary artery without angina pectoris: Secondary | ICD-10-CM | POA: Diagnosis not present

## 2020-03-10 LAB — GLUCOSE, CAPILLARY: Glucose-Capillary: 74 mg/dL (ref 70–99)

## 2020-03-10 MED ORDER — FLUDEOXYGLUCOSE F - 18 (FDG) INJECTION
14.0000 | Freq: Once | INTRAVENOUS | Status: AC | PRN
  Administered 2020-03-10: 15.1 via INTRAVENOUS

## 2020-03-10 NOTE — Progress Notes (Signed)
The proposed treatment discussed purpose only and is not a binding recommendation.  The patient was not physically examined nor present for their treatment options.  Therefore, final treatment plans cannot be decided.

## 2020-03-12 ENCOUNTER — Ambulatory Visit (HOSPITAL_COMMUNITY)
Admission: RE | Admit: 2020-03-12 | Discharge: 2020-03-12 | Disposition: A | Discharge status: Home / Self Care | Payer: 59 | Source: Ambulatory Visit | Attending: Pulmonary Disease | Admitting: Pulmonary Disease

## 2020-03-12 DIAGNOSIS — R918 Other nonspecific abnormal finding of lung field: Secondary | ICD-10-CM

## 2020-03-12 MED ORDER — GADOBUTROL 1 MMOL/ML IV SOLN
10.0000 mL | Freq: Once | INTRAVENOUS | Status: AC | PRN
  Administered 2020-03-12: 10 mL via INTRAVENOUS

## 2020-03-14 ENCOUNTER — Other Ambulatory Visit: Payer: Self-pay

## 2020-03-14 ENCOUNTER — Inpatient Hospital Stay: Payer: 59

## 2020-03-14 ENCOUNTER — Encounter: Payer: Self-pay | Admitting: *Deleted

## 2020-03-14 ENCOUNTER — Other Ambulatory Visit (HOSPITAL_COMMUNITY)
Admission: RE | Admit: 2020-03-14 | Discharge: 2020-03-14 | Disposition: A | Discharge status: Home / Self Care | Payer: 59 | Source: Ambulatory Visit | Attending: Pulmonary Disease | Admitting: Pulmonary Disease

## 2020-03-14 ENCOUNTER — Inpatient Hospital Stay: Payer: 59 | Attending: Internal Medicine | Admitting: Internal Medicine

## 2020-03-14 ENCOUNTER — Encounter: Payer: Self-pay | Admitting: Internal Medicine

## 2020-03-14 ENCOUNTER — Other Ambulatory Visit: Payer: Self-pay | Admitting: Emergency Medicine

## 2020-03-14 ENCOUNTER — Encounter: Payer: Self-pay | Admitting: Emergency Medicine

## 2020-03-14 ENCOUNTER — Telehealth: Payer: Self-pay | Admitting: Pulmonary Disease

## 2020-03-14 VITALS — BP 135/77 | HR 67 | Temp 97.6°F | Resp 15 | Ht 71.0 in | Wt 272.9 lb

## 2020-03-14 DIAGNOSIS — Z20822 Contact with and (suspected) exposure to covid-19: Secondary | ICD-10-CM | POA: Insufficient documentation

## 2020-03-14 DIAGNOSIS — Z01812 Encounter for preprocedural laboratory examination: Secondary | ICD-10-CM | POA: Diagnosis present

## 2020-03-14 DIAGNOSIS — Z720 Tobacco use: Secondary | ICD-10-CM

## 2020-03-14 DIAGNOSIS — C3491 Malignant neoplasm of unspecified part of right bronchus or lung: Secondary | ICD-10-CM

## 2020-03-14 LAB — CBC WITH DIFFERENTIAL (CANCER CENTER ONLY)
Abs Immature Granulocytes: 0.15 10*3/uL — ABNORMAL HIGH (ref 0.00–0.07)
Basophils Absolute: 0 10*3/uL (ref 0.0–0.1)
Basophils Relative: 1 %
Eosinophils Absolute: 0 10*3/uL (ref 0.0–0.5)
Eosinophils Relative: 0 %
HCT: 42.7 % (ref 39.0–52.0)
Hemoglobin: 14.4 g/dL (ref 13.0–17.0)
Immature Granulocytes: 2 %
Lymphocytes Relative: 19 %
Lymphs Abs: 1.6 10*3/uL (ref 0.7–4.0)
MCH: 30.4 pg (ref 26.0–34.0)
MCHC: 33.7 g/dL (ref 30.0–36.0)
MCV: 90.3 fL (ref 80.0–100.0)
Monocytes Absolute: 0.5 10*3/uL (ref 0.1–1.0)
Monocytes Relative: 6 %
Neutro Abs: 6.2 10*3/uL (ref 1.7–7.7)
Neutrophils Relative %: 72 %
Platelet Count: 320 10*3/uL (ref 150–400)
RBC: 4.73 MIL/uL (ref 4.22–5.81)
RDW: 11.9 % (ref 11.5–15.5)
WBC Count: 8.5 10*3/uL (ref 4.0–10.5)
nRBC: 0 % (ref 0.0–0.2)

## 2020-03-14 LAB — SARS CORONAVIRUS 2 (TAT 6-24 HRS): SARS Coronavirus 2: NEGATIVE

## 2020-03-14 LAB — CMP (CANCER CENTER ONLY)
ALT: 23 U/L (ref 0–44)
AST: 16 U/L (ref 15–41)
Albumin: 4.2 g/dL (ref 3.5–5.0)
Alkaline Phosphatase: 60 U/L (ref 38–126)
Anion gap: 8 (ref 5–15)
BUN: 16 mg/dL (ref 6–20)
CO2: 29 mmol/L (ref 22–32)
Calcium: 9.6 mg/dL (ref 8.9–10.3)
Chloride: 104 mmol/L (ref 98–111)
Creatinine: 0.93 mg/dL (ref 0.61–1.24)
GFR, Estimated: >60 mL/min (ref >60)
Glucose, Bld: 87 mg/dL (ref 70–99)
Potassium: 4 mmol/L (ref 3.5–5.1)
Sodium: 141 mmol/L (ref 135–145)
Total Bilirubin: 0.3 mg/dL (ref 0.3–1.2)
Total Protein: 7.7 g/dL (ref 6.5–8.1)

## 2020-03-14 LAB — RESEARCH LABS

## 2020-03-14 NOTE — Research (Signed)
Trial: Customer service manager for the Discovery and Validation of Biomarkers for the Prediction, Diagnosis and Management of Disease- [facility]  Patient Jerry [name] was identified by [employer] as a potential candidate for the above listed study.  This Clinical Research Nurse along with Research Coordinator Jerry [name] met with Jerry [name], [id number], on 03/14/20 in a manner and location that ensures patient privacy to discuss participation in the above listed research study.  Patient is Accompanied by spouse Jerry [name].  A copy of the informed consent document and separate HIPAA Authorization was provided to the patient.  Patient reads, speaks, and understands [place].    Patient was provided with the business card of Coordinator Jerry [name] and encouraged to contact the research team with any questions.  Patient was provided the option of taking informed consent documents home to review and was encouraged to review at their convenience with their support network, including other care providers. Patient is comfortable with making a decision regarding study participation today.  As outlined in the informed consent form, this Nurse and Jerry [name] discussed the purpose of the research study, the investigational nature of the study, study procedures and requirements for study participation, potential risks and benefits of study participation, as well as alternatives to participation. The patient understands participation is voluntary and they may withdraw from study participation at any time.  This Nurse has reviewed this patient's inclusion and exclusion criteria and confirmed Jerry [name] is eligible for study participation.  Patient will continue with enrollment.  Confidentiality and how the patient's information will be used as part of study participation were discussed.  Patient was informed there is reimbursement provided for their time and effort spent on trial  participation.  The patient is encouraged to discuss research study participation with their insurance provider to determine what costs they may incur as part of study participation, including research related injury.    All questions were answered to patient's satisfaction.  The informed consent and separate HIPAA Authorization was reviewed page by page.  The patient's mental and emotional status is appropriate to provide informed consent, and the patient verbalizes an understanding of study participation.  Patient has agreed to participate in the above listed research study and has voluntarily signed the informed consent version 2 and separate HIPAA Authorization, version 5 on 03/14/20 at 3:15 PM.  The patient was provided with a copy of the signed informed consent form and separate HIPAA Authorization for their reference.  No study specific procedures were obtained prior to the signing of the informed consent document.  Approximately 15 minutes were spent with the patient reviewing the informed consent documents.  Wells Guiles '[place] Nurse 03/14/2020, 16:07

## 2020-03-14 NOTE — Telephone Encounter (Signed)
PCCM:  MRI negative  PET Scan with no distant disease  Seeing Dr. Julien Nordmann today.  Referral to thoracic surgery placed   Garner Nash, DO [facility] 03/14/2020 7:44 AM

## 2020-03-14 NOTE — Progress Notes (Signed)
  Oncology Nurse Navigator Documentation  Oncology Nurse Navigator Flowsheets 03/14/2020  Abnormal Finding Date 02/23/2020  Confirmed Diagnosis Date 03/02/2020  Diagnosis Status Confirmed Diagnosis Complete  Planned Course of Treatment Surgery  Phase of Treatment Surgery  Navigator Follow Up Date: 03/21/2020  Navigator Follow Up Reason: Review Note  Navigator Location [facility]  Referral Date to RadOnc/MedOnc 03/02/2020  Navigator Encounter Type Clinic/MDC;Initial MedOnc  Patient Visit Type MedOnc  Treatment Phase Pre-Tx/Tx Discussion  Barriers/Navigation Needs Education/I met Mr. Stocking and his wife today with his visit to see Dr. Julien Nordmann.  Patient has stage II B/IIIA lung cancer and treatment plan is first surgery.  He has PFT's on 3/9 and an appt to see Dr. Kipp Brood on 3/11.  I gave information on dx and help to explain next steps.    Education Newly Diagnosed Cancer Education;Other  Interventions Education;Psycho-Social Support  Acuity Level 2-Minimal Needs (1-2 Barriers Identified)  Education Method Verbal;Written  Time Spent with Patient 30

## 2020-03-14 NOTE — Progress Notes (Signed)
[facility] [phone]   [phone]  CONSULT NOTE  REFERRING PHYSICIAN: Dr. Leory Plowman Icard  REASON FOR CONSULTATION:  45 years old white male recently diagnosed with lung cancer.  HPI Jerry [name] is a 45 y.o. male with past medical history significant for actionable pneumonia as well as history of smoking who had screening chest x-ray for his job and that showed suspicious lesion in the right lung.  This was followed by CT scan of the chest on 02/23/2020 and it showed 5.0 x 3.3 cm right upper lobe lung mass posteriorly abutting the major fissure which demonstrates slight tenting.  The margins are irregular and the lesion is worrisome for a primary lung neoplasm.  He was referred to Dr. Valeta Harms and CT super D of the chest was performed on 03/01/2020 and it showed 5.2 x 5.1 x 3.7 cm right upper lobe mass highly concerning for primary bronchogenic carcinoma with no definite lymphadenopathy.  The patient underwent video bronchoscopy with electromagnetic navigation procedure as well as endobronchial ultrasound procedure under the care of Dr. Valeta Harms.  The final cytology ([id number]) showed malignant cells consistent with non-small cell carcinoma. Immunohistochemistry performed on cell block shows the malignant cells are positive with TTF-1 and Napsin-A and negative with cytokeratin 5/6 and p40. The immunophenotype is consistent with primary lung adenocarcinoma.  The patient had a PET scan on March 10, 2020 and it showed the underlying posterior right upper lobe lung mass is partially obscured by new airspace disease and measuring 4.2 x 3.7 cm with SUV max of 11.7.  There was new posterior right upper lobe airspace disease with hypermetabolism.  There was no evidence of thoracic nodal or hypermetabolic extrathoracic metastasis.  The patient had MRI of the brain performed on March 12, 2020 and that showed no evidence of metastatic disease to the brain.  He was referred by Dr.  Valeta Harms to me today for evaluation and recommendation regarding his condition. When seen today the patient is feeling fine except for an anxiety about his recent diagnosis.  He also has mild cough but no significant chest pain, shortness of breath or hemoptysis.  He has no nausea, vomiting, diarrhea or constipation.  He denied having any headache or visual changes.  He has no weight loss or night sweats. Family history significant for father with a stroke and hypertension.  Maternal aunt with lung cancer and maternal grandmother with lung cancer. The patient is married and has 6 children.  He works as an Clinical biochemist.  He was accompanied today by his wife Jerry [name].  The patient has a history of smoking up to 2 pack/day for around 20 years and quit 10 years ago but he started vaping and discontinued a month ago.  He has no history of alcohol or drug abuse.  HPI  Past Medical History:  Diagnosis Date  . 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 Former smoker   . Pneumonia     Past Surgical History:  Procedure Laterality Date  . VIDEO BRONCHOSCOPY WITH ENDOBRONCHIAL NAVIGATION Right 03/02/2020   Procedure: VIDEO BRONCHOSCOPY WITH ENDOBRONCHIAL NAVIGATION;  Surgeon: Garner Nash, DO;  Location: [facility];  Service: Pulmonary;  Laterality: Right;  Marland Kitchen VIDEO BRONCHOSCOPY WITH ENDOBRONCHIAL ULTRASOUND Bilateral 03/02/2020   Procedure: VIDEO BRONCHOSCOPY WITH ENDOBRONCHIAL ULTRASOUND;  Surgeon: Garner Nash, DO;  Location: [facility];  Service: Pulmonary;  Laterality: Bilateral;  . WISDOM TOOTH EXTRACTION      Family History  Problem Relation Age of Onset  .  Stroke Father   . Hypertension Father   . Hyperlipidemia Father   . Arrhythmia Father   . Lung cancer Paternal Aunt     Social History Social History   Tobacco Use  . Smoking status: Former Smoker    Packs/day: 1.00    Years: 17.00    Pack years: 17.00    Types: Cigarettes    Start date: 03/01/1991    Quit date: 02/29/2008    Years since quitting: 12.0   . Smokeless tobacco: Never Used  . Tobacco comment: still using vape  Vaping Use  . Vaping Use: Every day  . Start date: 02/08/2013  . Last attempt to quit: 02/29/2020  . Substances: Nicotine  Substance Use Topics  . 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  . Drug use: Never    No Known Allergies  Current Outpatient Medications  Medication Sig Dispense Refill  . acetaminophen (TYLENOL) 500 MG tablet Take 500 mg by mouth every 6 (six) hours as needed.    . nicotine polacrilex (NICORETTE) 4 MG gum Take 4 mg by mouth as needed for smoking cessation. Using 2 mg not 4mg      No current facility-administered medications for this visit.    Review of Systems  Constitutional: positive for fatigue Eyes: negative Ears, nose, mouth, throat, and face: negative Respiratory: positive for cough Cardiovascular: negative Gastrointestinal: negative Genitourinary:negative Integument/breast: negative Hematologic/lymphatic: negative Musculoskeletal:negative Neurological: negative Behavioral/Psych: negative Endocrine: negative Allergic/Immunologic: negative  Physical Exam  QQV:ZDGLO, healthy, no distress, well nourished, well developed and anxious SKIN: skin color, texture, turgor are normal, no rashes or significant lesions HEAD: Normocephalic, No masses, lesions, tenderness or abnormalities EYES: normal, PERRLA, Conjunctiva are pink and non-injected EARS: External ears normal, Canals clear OROPHARYNX:no exudate, no erythema and lips, buccal mucosa, and tongue normal  NECK: supple, no adenopathy, no JVD LYMPH:  no palpable lymphadenopathy, no hepatosplenomegaly LUNGS: clear to auscultation , and palpation HEART: regular rate & rhythm, no murmurs and no gallops ABDOMEN:abdomen soft, non-tender, obese, normal bowel sounds and no masses or organomegaly BACK: No CVA tenderness, Range of motion is normal EXTREMITIES:no joint deformities, effusion, or inflammation, no edema   NEURO: alert & oriented x 3 with fluent speech, no focal motor/sensory deficits  PERFORMANCE STATUS: ECOG 1  LABORATORY DATA: Lab Results  Component Value Date   WBC 4.5 02/22/2020   HGB 14.8 02/22/2020   HCT 43.3 02/22/2020   MCV 90 02/22/2020   PLT 220 02/22/2020      Chemistry      Component Value Date/Time   NA 142 02/22/2020 0948   K 4.6 02/22/2020 0948   CL 103 02/22/2020 0948   CO2 23 02/22/2020 0948   BUN 11 02/22/2020 0948   CREATININE 0.99 02/22/2020 0948      Component Value Date/Time   CALCIUM 9.4 02/22/2020 0948   ALKPHOS 48 02/22/2020 0948   AST 15 02/22/2020 0948   ALT 9 02/22/2020 0948   BILITOT 0.4 02/22/2020 0948       RADIOGRAPHIC STUDIES: DG Eye Foreign Body  Result Date: 03/11/2020 CLINICAL DATA:  Metal working/exposure; clearance prior to MRI. 45 year old male. EXAM: ORBITS FOR FOREIGN BODY - 2 VIEW COMPARISON:  None. FINDINGS: There is no evidence of metallic foreign body within the orbits. No significant bone abnormality identified. IMPRESSION: No evidence of metallic foreign body within the orbits. Electronically Signed   By: Genevie Ann M.D.   On: 03/11/2020 09:45   MR BRAIN W WO  CONTRAST  Result Date: 03/14/2020 CLINICAL DATA:  Lung cancer staging EXAM: MRI HEAD WITHOUT AND WITH CONTRAST TECHNIQUE: Multiplanar, multiecho pulse sequences of the brain and surrounding structures were obtained without and with intravenous contrast. CONTRAST:  24mL GADAVIST GADOBUTROL 1 MMOL/ML IV SOLN COMPARISON:  None. FINDINGS: Brain: No swelling or enhancement to suggest metastatic disease. No infarction, hemorrhage, hydrocephalus, extra-axial collection or mass lesion. Vascular: Normal flow voids. Skull and upper cervical spine: No marrow lesion is seen. Sinuses/Orbits: Negative. IMPRESSION: Negative for metastatic disease. Electronically Signed   By: Monte Fantasia M.D.   On: 03/14/2020 05:49   NM PET Image Initial (PI) Skull Base To Thigh  Result Date:  03/11/2020 CLINICAL DATA:  Initial treatment strategy for right upper lobe lung mass. EXAM: NUCLEAR MEDICINE PET SKULL BASE TO THIGH TECHNIQUE: 50.1 mCi F-18 FDG was injected intravenously. Full-ring PET imaging was performed from the skull base to thigh after the radiotracer. CT data was obtained and used for attenuation correction and anatomic localization. Fasting blood glucose: 74 mg/dl COMPARISON:  03/01/2020 chest CT FINDINGS: Mediastinal blood pool activity: SUV max 2.0 Liver activity: SUV max NA NECK: No areas of abnormal hypermetabolism. Incidental CT findings: No cervical adenopathy. Left carotid atherosclerosis. CHEST: No thoracic nodal hypermetabolism. New posterior right upper lobe airspace disease with hypermetabolism. Example at a S.U.V. max of 8.8 on 80/3. The underlying posterior right upper lobe lung mass is partially obscured by new airspace disease, measuring 4.2 x 3.7 cm and a S.U.V. max of 11.7 on 85/3. Incidental CT findings: Mild centrilobular emphysema. Trace right-sided pleural fluid, new. Lad coronary artery calcification. ABDOMEN/PELVIS: No abdominopelvic parenchymal or nodal hypermetabolism. Incidental CT findings: Normal adrenal glands. Aortic atherosclerosis. No abdominopelvic adenopathy. Normal stomach and abdominopelvic bowel loops. SKELETON: No abnormal marrow activity. Incidental CT findings: Bilateral L5 pars defects without significant malalignment. IMPRESSION: 1. Hypermetabolic right upper lobe lung mass, consistent with primary bronchogenic carcinoma. No evidence of thoracic nodal or hypermetabolic extra thoracic metastasis. Presuming non-small-cell histology, T2bN0M0 or stage IIA. 2. Development of posterior right upper lobe airspace disease compared to 03/01/2020, likely postobstructive pneumonitis. Trace right pleural fluid is also new and presumably secondary. 3.  Aortic Atherosclerosis (ICD10-I70.0).  Emphysema (ICD10-J43.9). 4. Significantly age advanced coronary artery  atherosclerosis. Recommend assessment of coronary risk factors and consideration of medical therapy. Electronically Signed   By: Abigail Miyamoto M.D.   On: 03/11/2020 14:59   DG CHEST PORT 1 VIEW  Result Date: 03/02/2020 CLINICAL DATA:  Bronchoscopy, right lung mass, biopsy EXAM: PORTABLE CHEST 1 VIEW COMPARISON:  02/22/2020, 03/01/2020 FINDINGS: Single frontal view of the chest demonstrates stable cardiac silhouette. Persistent right upper lobe mass, abutting the fissures, with increased peripheral consolidation likely reflecting post biopsy changes. No evidence of pneumothorax. No pleural effusion. No acute bony abnormalities. IMPRESSION: 1. Expected postprocedural changes after right upper lobe mass biopsy. No evidence of pneumothorax. Electronically Signed   By: Randa Ngo M.D.   On: 03/02/2020 16:37   CT Super D Chest Wo Contrast  Result Date: 03/02/2020 CLINICAL DATA:  45 year old male with history of right upper lobe mass. Follow-up study. EXAM: CT CHEST WITHOUT CONTRAST TECHNIQUE: Multidetector CT imaging of the chest was performed using thin slice collimation for electromagnetic bronchoscopy planning purposes, without intravenous contrast. COMPARISON:  Chest CT 02/23/2020. FINDINGS: Cardiovascular: Heart size is normal. There is no significant pericardial fluid, thickening or pericardial calcification. There is aortic atherosclerosis, as well as atherosclerosis of the great vessels of the mediastinum and the coronary arteries, including  calcified atherosclerotic plaque in the left main, left anterior descending and left circumflex coronary arteries. Mediastinum/Nodes: No pathologically enlarged mediastinal or hilar lymph nodes. Please note that accurate exclusion of hilar adenopathy is limited on noncontrast CT scans. Esophagus is unremarkable in appearance. No axillary lymphadenopathy. Lungs/Pleura: In the right upper lobe there is a macrolobulated mass (axial image 58 of series 3 and sagittal  image 40 of series 6) measuring 5.2 x 5.1 x 3.7 cm with some irregular spiculated margins and extension to the overlying pleura both laterally and posteriorly via contact with the major fissure. Scattered areas of peribronchovascular ground-glass attenuation are also noted elsewhere throughout the right upper lobe. No confluent consolidative airspace disease. No pleural effusions. Upper Abdomen: Aortic atherosclerosis. 1.3 x 0.9 cm low-attenuation lesion in segment 4B of the liver, incompletely characterized on today's non-contrast CT examination, but statistically likely to represent a cyst. Musculoskeletal: There are no aggressive appearing lytic or blastic lesions noted in the visualized portions of the skeleton. IMPRESSION: 1. 5.2 x 5.1 x 3.7 cm right upper lobe mass highly concerning for primary bronchogenic carcinoma. No definite lymphadenopathy. 2. Aortic atherosclerosis, in addition to left main and 2 vessel coronary artery disease. Please note that although the presence of coronary artery calcium documents the presence of coronary artery disease, the severity of this disease and any potential stenosis cannot be assessed on this non-gated CT examination. Assessment for potential risk factor modification, dietary therapy or pharmacologic therapy may be warranted, if clinically indicated. Aortic Atherosclerosis (ICD10-I70.0). Electronically Signed   By: Vinnie Langton M.D.   On: 03/02/2020 11:14   DG C-ARM BRONCHOSCOPY  Result Date: 03/02/2020 C-ARM BRONCHOSCOPY: Fluoroscopy was utilized by the requesting physician.  No radiographic interpretation.    ASSESSMENT: This is a very pleasant 45 years old white male recently diagnosed with a stage IIa (T2b, N0, M0) non-small cell lung cancer, adenocarcinoma presented with right upper lobe lung mass diagnosed in February 2022.   PLAN: I had a lengthy discussion with the patient and his wife today about his current disease stage, prognosis and treatment  options. I personally and independently reviewed the imaging studies and showed it to the patient and his wife. The patient has no concerning comorbidities and he may be a great candidate for surgical resection if the pulmonary function tests are acceptable. He is scheduled to have pulmonary function test in 2 days followed by a visit by Dr. Kipp Brood for discussion of the surgical option. I will arrange for the patient to come back for follow-up visit 2-3 weeks after his surgery for reevaluation and discussion of any adjuvant treatment options if needed. The patient and his wife are in agreement with the current plan. He was advised to continue with the smoking cessation. The patient was also advised to call immediately if he has any other concerning symptoms in the interval. The patient voices understanding of current disease status and treatment options and is in agreement with the current care plan.  All questions were answered. The patient knows to call the clinic with any problems, questions or concerns. We can certainly see the patient much sooner if necessary.  Thank you so much for allowing me to participate in the care of Jerry [name]. I will continue to follow up the patient with you and assist in his care.  The total time spent in the appointment was 60 minutes.  Disclaimer: This note was dictated with voice recognition software. Similar sounding words can inadvertently be transcribed and may not  be corrected upon review.   Eilleen Kempf March 14, 2020, 2:36 PM

## 2020-03-14 NOTE — Research (Signed)
Procurement of Librarian, academic for the Discovery and Validation of Biomarkers for the Prediction, Diagnosis and Management of Disease- [place]  This Coordinator has reviewed this patient's inclusion and exclusion criteria as a second review and confirms Jerry [name] is eligible for study participation.  Patient may continue with enrollment.  Clabe Seal Clinical Research Coordinator I  03/14/20 4:15 PM

## 2020-03-16 ENCOUNTER — Other Ambulatory Visit: Payer: Self-pay

## 2020-03-16 ENCOUNTER — Ambulatory Visit (INDEPENDENT_AMBULATORY_CARE_PROVIDER_SITE_OTHER): Payer: 59 | Admitting: Pulmonary Disease

## 2020-03-16 DIAGNOSIS — R918 Other nonspecific abnormal finding of lung field: Secondary | ICD-10-CM

## 2020-03-16 LAB — PULMONARY FUNCTION TEST
DL/VA % pred: 111 %
DL/VA: 5.09 ml/min/mmHg/L
DLCO cor % pred: 114 %
DLCO cor: 37.49 ml/min/mmHg
DLCO unc % pred: 114 %
DLCO unc: 37.28 ml/min/mmHg
FEF 25-75 Post: 4.02 L/sec
FEF 25-75 Pre: 2.68 L/sec
FEF2575-%Change-Post: 49 %
FEF2575-%Pred-Post: 98 %
FEF2575-%Pred-Pre: 65 %
FEV1-%Change-Post: 12 %
FEV1-%Pred-Post: 97 %
FEV1-%Pred-Pre: 86 %
FEV1-Post: 4.3 L
FEV1-Pre: 3.81 L
FEV1FVC-%Change-Post: 11 %
FEV1FVC-%Pred-Pre: 88 %
FEV6-%Change-Post: 1 %
FEV6-%Pred-Post: 100 %
FEV6-%Pred-Pre: 99 %
FEV6-Post: 5.48 L
FEV6-Pre: 5.4 L
FEV6FVC-%Change-Post: 0 %
FEV6FVC-%Pred-Post: 102 %
FEV6FVC-%Pred-Pre: 102 %
FVC-%Change-Post: 1 %
FVC-%Pred-Post: 98 %
FVC-%Pred-Pre: 96 %
FVC-Post: 5.48 L
FVC-Pre: 5.41 L
Post FEV1/FVC ratio: 78 %
Post FEV6/FVC ratio: 100 %
Pre FEV1/FVC ratio: 70 %
Pre FEV6/FVC Ratio: 100 %
RV % pred: 105 %
RV: 2.08 L
TLC % pred: 107 %
TLC: 7.86 L

## 2020-03-16 NOTE — Patient Instructions (Signed)
Pulmonary function test performed today. 

## 2020-03-16 NOTE — Progress Notes (Signed)
Full PFT performed today. °

## 2020-03-18 ENCOUNTER — Other Ambulatory Visit: Payer: Self-pay

## 2020-03-18 ENCOUNTER — Other Ambulatory Visit: Payer: Self-pay | Admitting: *Deleted

## 2020-03-18 ENCOUNTER — Institutional Professional Consult (permissible substitution): Payer: 59 | Admitting: Thoracic Surgery (Cardiothoracic Vascular Surgery)

## 2020-03-18 VITALS — BP 132/83 | HR 75 | Resp 20 | Ht 71.0 in | Wt 280.0 lb

## 2020-03-18 DIAGNOSIS — R918 Other nonspecific abnormal finding of lung field: Secondary | ICD-10-CM | POA: Diagnosis not present

## 2020-03-18 DIAGNOSIS — C3411 Malignant neoplasm of upper lobe, right bronchus or lung: Secondary | ICD-10-CM

## 2020-03-18 NOTE — Progress Notes (Signed)
[address]       [place] [address]             [phone]                    Jerry [name] [facility] Medical Record #[id number] Date of Birth: 1975-12-26  Referring: Garner Nash, DO Primary Care: [name], NP Primary Cardiologist: No primary care provider on file.  Chief Complaint:    Chief Complaint  Patient presents with  . Lung Lesion    Initial surgical consult, CT chest 2/22, PET 3/3    History of Present Illness:    Jerry [name] 45 y.o. male referred by Dr. Valeta Harms for surgical evaluation of a biopsy-proven non-small cell lung cancer of the right upper lobe.  This was originally found incidentally on a work physical.  The chest x-ray noted to this lesion.  He subsequently underwent formal evaluation by Dr. Valeta Harms.  He has also undergone an MRI brain which has been negative as well as a PET/CT which shows no nodal or distant disease.  From a symptomatic standpoint he denies any respiratory problems.  His weight has been stable.  He denies any neurologic symptoms.  He also denies any anginal symptoms.    Smoking Hx: No longer smokes, and quit e-cigarettes  in late February.    Zubrod Score: At the time of surgery this patient's most appropriate activity status/level should be described as: [x]     0    Normal activity, no symptoms []     1    Restricted in physical strenuous activity but ambulatory, able to do out light work []     2    Ambulatory and capable of self care, unable to do work activities, up and about               >50 % of waking hours                              []     3    Only limited self care, in bed greater than 50% of waking hours []     4    Completely disabled, no self care, confined to bed or chair []     5    Moribund   Past Medical History:  Diagnosis Date  . 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 Former smoker   . Pneumonia     Past Surgical History:  Procedure Laterality Date  . VIDEO BRONCHOSCOPY WITH  ENDOBRONCHIAL NAVIGATION Right 03/02/2020   Procedure: VIDEO BRONCHOSCOPY WITH ENDOBRONCHIAL NAVIGATION;  Surgeon: Garner Nash, DO;  Location: [facility];  Service: Pulmonary;  Laterality: Right;  Marland Kitchen VIDEO BRONCHOSCOPY WITH ENDOBRONCHIAL ULTRASOUND Bilateral 03/02/2020   Procedure: VIDEO BRONCHOSCOPY WITH ENDOBRONCHIAL ULTRASOUND;  Surgeon: Garner Nash, DO;  Location: [facility];  Service: Pulmonary;  Laterality: Bilateral;  . WISDOM TOOTH EXTRACTION      Family History  Problem Relation Age of Onset  . Stroke Father   . Hypertension Father   . Hyperlipidemia Father   . Arrhythmia Father   . Lung cancer Paternal Aunt      Social History   Tobacco Use  Smoking Status Former Smoker  . Packs/day: 1.00  . Years: 17.00  . Pack years: 17.00  . Types: Cigarettes  . Start date: 03/01/1991  . Quit date: 02/29/2008  . 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     No Known Allergies  Current Outpatient Medications  Medication Sig Dispense Refill  . nicotine polacrilex (NICORETTE) 4 MG gum Take 4 mg by mouth as needed for smoking cessation. Using 2 mg not 4mg     . acetaminophen (TYLENOL) 500 MG tablet Take 500 mg by mouth every 6 (six) hours as needed. (Patient not taking: Reported on 03/18/2020)     No current facility-administered medications for this visit.    Review of Systems  Constitutional: Negative.   Respiratory: Negative.   Cardiovascular: Negative.   Musculoskeletal: Negative.   Neurological: Negative.      PHYSICAL EXAMINATION: BP 132/83 (BP Location: Right Arm, Patient Position: Sitting)   Pulse 75   Resp 20   Ht 5\' 11"  (1.803 m)   Wt 280 lb (127 kg)   SpO2 97% Comment: RA  BMI 39.05 kg/m  Physical Exam Constitutional:      General: He is not in acute distress.    Appearance: Normal appearance. He is  obese. He is not ill-appearing.  Eyes:     Extraocular Movements: Extraocular movements intact.  Cardiovascular:     Rate and Rhythm: Normal rate.  Pulmonary:     Effort: Pulmonary effort is normal. No respiratory distress.  Musculoskeletal:        General: Normal range of motion.     Cervical back: Normal range of motion.  Skin:    General: Skin is warm and dry.  Neurological:     General: No focal deficit present.     Mental Status: He is alert and oriented to person, place, and time.  Psychiatric:        Mood and Affect: Mood normal.     Diagnostic Studies & Laboratory data:     Recent Radiology Findings:   DG Eye Foreign Body  Result Date: 03/11/2020 CLINICAL DATA:  Metal working/exposure; clearance prior to MRI. 45 year old male. EXAM: ORBITS FOR FOREIGN BODY - 2 VIEW COMPARISON:  None. FINDINGS: There is no evidence of metallic foreign body within the orbits. No significant bone abnormality identified. IMPRESSION: No evidence of metallic foreign body within the orbits. Electronically Signed   By: Genevie Ann M.D.   On: 03/11/2020 09:45   MR BRAIN W WO CONTRAST  Result Date: 03/14/2020 CLINICAL DATA:  Lung cancer staging EXAM: MRI HEAD WITHOUT AND WITH CONTRAST TECHNIQUE: Multiplanar, multiecho pulse sequences of the brain and surrounding structures were obtained without and with intravenous contrast. CONTRAST:  71mL GADAVIST GADOBUTROL 1 MMOL/ML IV SOLN COMPARISON:  None. FINDINGS: Brain: No swelling or enhancement to suggest metastatic disease. No infarction, hemorrhage, hydrocephalus, extra-axial collection or mass lesion. Vascular: Normal flow voids. Skull and upper cervical spine: No marrow lesion is seen. Sinuses/Orbits: Negative. IMPRESSION: Negative for metastatic disease. Electronically Signed   By: Monte Fantasia M.D.   On: 03/14/2020 05:49   NM PET Image Initial (PI) Skull Base To Thigh  Result Date: 03/11/2020 CLINICAL DATA:  Initial treatment strategy for right upper lobe  lung mass. EXAM: NUCLEAR MEDICINE PET SKULL BASE TO THIGH TECHNIQUE: 50.1 mCi F-18 FDG was injected intravenously. Full-ring PET imaging was performed from the skull base to thigh after the radiotracer. CT data was obtained and used for attenuation correction and anatomic localization. Fasting blood glucose: 74 mg/dl COMPARISON:  03/01/2020  chest CT FINDINGS: Mediastinal blood pool activity: SUV max 2.0 Liver activity: SUV max NA NECK: No areas of abnormal hypermetabolism. Incidental CT findings: No cervical adenopathy. Left carotid atherosclerosis. CHEST: No thoracic nodal hypermetabolism. New posterior right upper lobe airspace disease with hypermetabolism. Example at a S.U.V. max of 8.8 on 80/3. The underlying posterior right upper lobe lung mass is partially obscured by new airspace disease, measuring 4.2 x 3.7 cm and a S.U.V. max of 11.7 on 85/3. Incidental CT findings: Mild centrilobular emphysema. Trace right-sided pleural fluid, new. Lad coronary artery calcification. ABDOMEN/PELVIS: No abdominopelvic parenchymal or nodal hypermetabolism. Incidental CT findings: Normal adrenal glands. Aortic atherosclerosis. No abdominopelvic adenopathy. Normal stomach and abdominopelvic bowel loops. SKELETON: No abnormal marrow activity. Incidental CT findings: Bilateral L5 pars defects without significant malalignment. IMPRESSION: 1. Hypermetabolic right upper lobe lung mass, consistent with primary bronchogenic carcinoma. No evidence of thoracic nodal or hypermetabolic extra thoracic metastasis. Presuming non-small-cell histology, T2bN0M0 or stage IIA. 2. Development of posterior right upper lobe airspace disease compared to 03/01/2020, likely postobstructive pneumonitis. Trace right pleural fluid is also new and presumably secondary. 3.  Aortic Atherosclerosis (ICD10-I70.0).  Emphysema (ICD10-J43.9). 4. Significantly age advanced coronary artery atherosclerosis. Recommend assessment of coronary risk factors and  consideration of medical therapy. Electronically Signed   By: Abigail Miyamoto M.D.   On: 03/11/2020 14:59   DG CHEST PORT 1 VIEW  Result Date: 03/02/2020 CLINICAL DATA:  Bronchoscopy, right lung mass, biopsy EXAM: PORTABLE CHEST 1 VIEW COMPARISON:  02/22/2020, 03/01/2020 FINDINGS: Single frontal view of the chest demonstrates stable cardiac silhouette. Persistent right upper lobe mass, abutting the fissures, with increased peripheral consolidation likely reflecting post biopsy changes. No evidence of pneumothorax. No pleural effusion. No acute bony abnormalities. IMPRESSION: 1. Expected postprocedural changes after right upper lobe mass biopsy. No evidence of pneumothorax. Electronically Signed   By: Randa Ngo M.D.   On: 03/02/2020 16:37   CT Super D Chest Wo Contrast  Result Date: 03/02/2020 CLINICAL DATA:  45 year old male with history of right upper lobe mass. Follow-up study. EXAM: CT CHEST WITHOUT CONTRAST TECHNIQUE: Multidetector CT imaging of the chest was performed using thin slice collimation for electromagnetic bronchoscopy planning purposes, without intravenous contrast. COMPARISON:  Chest CT 02/23/2020. FINDINGS: Cardiovascular: Heart size is normal. There is no significant pericardial fluid, thickening or pericardial calcification. There is aortic atherosclerosis, as well as atherosclerosis of the great vessels of the mediastinum and the coronary arteries, including calcified atherosclerotic plaque in the left main, left anterior descending and left circumflex coronary arteries. Mediastinum/Nodes: No pathologically enlarged mediastinal or hilar lymph nodes. Please note that accurate exclusion of hilar adenopathy is limited on noncontrast CT scans. Esophagus is unremarkable in appearance. No axillary lymphadenopathy. Lungs/Pleura: In the right upper lobe there is a macrolobulated mass (axial image 58 of series 3 and sagittal image 40 of series 6) measuring 5.2 x 5.1 x 3.7 cm with some irregular  spiculated margins and extension to the overlying pleura both laterally and posteriorly via contact with the major fissure. Scattered areas of peribronchovascular ground-glass attenuation are also noted elsewhere throughout the right upper lobe. No confluent consolidative airspace disease. No pleural effusions. Upper Abdomen: Aortic atherosclerosis. 1.3 x 0.9 cm low-attenuation lesion in segment 4B of the liver, incompletely characterized on today's non-contrast CT examination, but statistically likely to represent a cyst. Musculoskeletal: There are no aggressive appearing lytic or blastic lesions noted in the visualized portions of the skeleton. IMPRESSION: 1. 5.2 x 5.1 x 3.7 cm right upper  lobe mass highly concerning for primary bronchogenic carcinoma. No definite lymphadenopathy. 2. Aortic atherosclerosis, in addition to left main and 2 vessel coronary artery disease. Please note that although the presence of coronary artery calcium documents the presence of coronary artery disease, the severity of this disease and any potential stenosis cannot be assessed on this non-gated CT examination. Assessment for potential risk factor modification, dietary therapy or pharmacologic therapy may be warranted, if clinically indicated. Aortic Atherosclerosis (ICD10-I70.0). Electronically Signed   By: Vinnie Langton M.D.   On: 03/02/2020 11:14   DG C-ARM BRONCHOSCOPY  Result Date: 03/02/2020 C-ARM BRONCHOSCOPY: Fluoroscopy was utilized by the requesting physician.  No radiographic interpretation.       I have independently reviewed the above radiology studies  and reviewed the findings with the patient.   Recent Lab Findings: Lab Results  Component Value Date   WBC 8.5 03/14/2020   HGB 14.4 03/14/2020   HCT 42.7 03/14/2020   PLT 320 03/14/2020   GLUCOSE 87 03/14/2020   CHOL 192 02/22/2020   TRIG 81 02/22/2020   HDL 44 02/22/2020   LDLCALC 133 (H) 02/22/2020   ALT 23 03/14/2020   AST 16 03/14/2020   NA  141 03/14/2020   K 4.0 03/14/2020   CL 104 03/14/2020   CREATININE 0.93 03/14/2020   BUN 16 03/14/2020   CO2 29 03/14/2020   TSH 0.509 02/22/2020     PFTs: - FVC: 96% - FEV1: 86% -DLCO: 114%  Problem List: 5.2 cm right upper lobe pulmonary mass.  Biopsy is consistent with non-small cell lung cancer.  Assessment / Plan:   45 year old male with stage IIa, T2b N0 M0 non-small cell lung cancer of the right upper lobe.  The risks and benefits of a robotic assisted right thoracoscopy with right upper lobectomy were discussed.  He is agreeable to proceed.  On review of his PET/CT there is some evidence of calcification in his LAD.  For this reason I have ordered a stress test to rule out any coronary disease.  He is tentatively scheduled for March 28, 2020.     I  spent 55 minutes with  the patient face to face in counseling and coordination of care.    [name] 03/18/2020 5:12 PM

## 2020-03-18 NOTE — H&P (View-Only) (Signed)
Jerry [name]       [place] [address]             [phone]                    Jerry [name] [facility] Medical Record #[id number] Date of Birth: 11/26/1975  Referring: Jerry Nash, DO Primary Care: Jerry Harbour, NP Primary Cardiologist: No primary care provider on file.  Chief Complaint:    Chief Complaint  Patient presents with  . Lung Lesion    Initial surgical consult, CT chest 2/22, PET 3/3    History of Present Illness:    Jerry [name] 45 y.o. male referred by Dr. Valeta [name] for surgical evaluation of a biopsy-proven non-small cell lung cancer of the right upper lobe.  This was originally found incidentally on a work physical.  The chest x-ray noted to this lesion.  He subsequently underwent formal evaluation by Dr. Valeta [name].  He has also undergone an MRI brain which has been negative as well as a PET/CT which shows no nodal or distant disease.  From a symptomatic standpoint he denies any respiratory problems.  His weight has been stable.  He denies any neurologic symptoms.  He also denies any anginal symptoms.    Smoking Hx: No longer smokes, and quit e-cigarettes  in late February.    Zubrod Score: At the time of surgery this patient's most appropriate activity status/level should be described as: [x]     0    Normal activity, no symptoms []     1    Restricted in physical strenuous activity but ambulatory, able to do out light work []     2    Ambulatory and capable of self care, unable to do work activities, up and about               >50 % of waking hours                              []     3    Only limited self care, in bed greater than 50% of waking hours []     4    Completely disabled, no self care, confined to bed or chair []     5    Moribund   Past Medical History:  Diagnosis Date  . 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 Former smoker   . Pneumonia     Past Surgical History:  Procedure Laterality Date  . VIDEO BRONCHOSCOPY WITH  ENDOBRONCHIAL NAVIGATION Right 03/02/2020   Procedure: VIDEO BRONCHOSCOPY WITH ENDOBRONCHIAL NAVIGATION;  Surgeon: Jerry Nash, DO;  Location: [facility];  Service: Pulmonary;  Laterality: Right;  Marland Kitchen VIDEO BRONCHOSCOPY WITH ENDOBRONCHIAL ULTRASOUND Bilateral 03/02/2020   Procedure: VIDEO BRONCHOSCOPY WITH ENDOBRONCHIAL ULTRASOUND;  Surgeon: Jerry Nash, DO;  Location: [facility];  Service: Pulmonary;  Laterality: Bilateral;  . WISDOM TOOTH EXTRACTION      Family History  Problem Relation Age of Onset  . Stroke Father   . Hypertension Father   . Hyperlipidemia Father   . Arrhythmia Father   . Lung cancer Paternal Aunt      Social History   Tobacco Use  Smoking Status Former Smoker  . Packs/day: 1.00  . Years: 17.00  . Pack years: 17.00  . Types: Cigarettes  . Start date: 03/01/1991  . Quit date: 02/29/2008  . 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     No Known Allergies  Current Outpatient Medications  Medication Sig Dispense Refill  . nicotine polacrilex (NICORETTE) 4 MG gum Take 4 mg by mouth as needed for smoking cessation. Using 2 mg not 4mg     . acetaminophen (TYLENOL) 500 MG tablet Take 500 mg by mouth every 6 (six) hours as needed. (Patient not taking: Reported on 03/18/2020)     No current facility-administered medications for this visit.    Review of Systems  Constitutional: Negative.   Respiratory: Negative.   Cardiovascular: Negative.   Musculoskeletal: Negative.   Neurological: Negative.      PHYSICAL EXAMINATION: BP 132/83 (BP Location: Right Arm, Patient Position: Sitting)   Pulse 75   Resp 20   Ht 5\' 11"  (1.803 m)   Wt 280 lb (127 kg)   SpO2 97% Comment: RA  BMI 39.05 kg/m  Physical Exam Constitutional:      General: He is not in acute distress.    Appearance: Normal appearance. He is  obese. He is not ill-appearing.  Eyes:     Extraocular Movements: Extraocular movements intact.  Cardiovascular:     Rate and Rhythm: Normal rate.  Pulmonary:     Effort: Pulmonary effort is normal. No respiratory distress.  Musculoskeletal:        General: Normal range of motion.     Cervical back: Normal range of motion.  Skin:    General: Skin is warm and dry.  Neurological:     General: No focal deficit present.     Mental Status: He is alert and oriented to person, place, and time.  Psychiatric:        Mood and Affect: Mood normal.     Diagnostic Studies & Laboratory data:     Recent Radiology Findings:   DG Eye Foreign Body  Result Date: 03/11/2020 CLINICAL DATA:  Metal working/exposure; clearance prior to MRI. 45 year old male. EXAM: ORBITS FOR FOREIGN BODY - 2 VIEW COMPARISON:  None. FINDINGS: There is no evidence of metallic foreign body within the orbits. No significant bone abnormality identified. IMPRESSION: No evidence of metallic foreign body within the orbits. Electronically Signed   By: Genevie Ann M.D.   On: 03/11/2020 09:45   MR BRAIN W WO CONTRAST  Result Date: 03/14/2020 CLINICAL DATA:  Lung cancer staging EXAM: MRI HEAD WITHOUT AND WITH CONTRAST TECHNIQUE: Multiplanar, multiecho pulse sequences of the brain and surrounding structures were obtained without and with intravenous contrast. CONTRAST:  50mL GADAVIST GADOBUTROL 1 MMOL/ML IV SOLN COMPARISON:  None. FINDINGS: Brain: No swelling or enhancement to suggest metastatic disease. No infarction, hemorrhage, hydrocephalus, extra-axial collection or mass lesion. Vascular: Normal flow voids. Skull and upper cervical spine: No marrow lesion is seen. Sinuses/Orbits: Negative. IMPRESSION: Negative for metastatic disease. Electronically Signed   By: Monte Fantasia M.D.   On: 03/14/2020 05:49   NM PET Image Initial (PI) Skull Base To Thigh  Result Date: 03/11/2020 CLINICAL DATA:  Initial treatment strategy for right upper lobe  lung mass. EXAM: NUCLEAR MEDICINE PET SKULL BASE TO THIGH TECHNIQUE: 50.1 mCi F-18 FDG was injected intravenously. Full-ring PET imaging was performed from the skull base to thigh after the radiotracer. CT data was obtained and used for attenuation correction and anatomic localization. Fasting blood glucose: 74 mg/dl COMPARISON:  03/01/2020  chest CT FINDINGS: Mediastinal blood pool activity: SUV max 2.0 Liver activity: SUV max NA NECK: No areas of abnormal hypermetabolism. Incidental CT findings: No cervical adenopathy. Left carotid atherosclerosis. CHEST: No thoracic nodal hypermetabolism. New posterior right upper lobe airspace disease with hypermetabolism. Example at a S.U.V. max of 8.8 on 80/3. The underlying posterior right upper lobe lung mass is partially obscured by new airspace disease, measuring 4.2 x 3.7 cm and a S.U.V. max of 11.7 on 85/3. Incidental CT findings: Mild centrilobular emphysema. Trace right-sided pleural fluid, new. Lad coronary artery calcification. ABDOMEN/PELVIS: No abdominopelvic parenchymal or nodal hypermetabolism. Incidental CT findings: Normal adrenal glands. Aortic atherosclerosis. No abdominopelvic adenopathy. Normal stomach and abdominopelvic bowel loops. SKELETON: No abnormal marrow activity. Incidental CT findings: Bilateral L5 pars defects without significant malalignment. IMPRESSION: 1. Hypermetabolic right upper lobe lung mass, consistent with primary bronchogenic carcinoma. No evidence of thoracic nodal or hypermetabolic extra thoracic metastasis. Presuming non-small-cell histology, T2bN0M0 or stage IIA. 2. Development of posterior right upper lobe airspace disease compared to 03/01/2020, likely postobstructive pneumonitis. Trace right pleural fluid is also new and presumably secondary. 3.  Aortic Atherosclerosis (ICD10-I70.0).  Emphysema (ICD10-J43.9). 4. Significantly age advanced coronary artery atherosclerosis. Recommend assessment of coronary risk factors and  consideration of medical therapy. Electronically Signed   By: Abigail Miyamoto M.D.   On: 03/11/2020 14:59   DG CHEST PORT 1 VIEW  Result Date: 03/02/2020 CLINICAL DATA:  Bronchoscopy, right lung mass, biopsy EXAM: PORTABLE CHEST 1 VIEW COMPARISON:  02/22/2020, 03/01/2020 FINDINGS: Single frontal view of the chest demonstrates stable cardiac silhouette. Persistent right upper lobe mass, abutting the fissures, with increased peripheral consolidation likely reflecting post biopsy changes. No evidence of pneumothorax. No pleural effusion. No acute bony abnormalities. IMPRESSION: 1. Expected postprocedural changes after right upper lobe mass biopsy. No evidence of pneumothorax. Electronically Signed   By: Randa Ngo M.D.   On: 03/02/2020 16:37   CT Super D Chest Wo Contrast  Result Date: 03/02/2020 CLINICAL DATA:  45 year old male with history of right upper lobe mass. Follow-up study. EXAM: CT CHEST WITHOUT CONTRAST TECHNIQUE: Multidetector CT imaging of the chest was performed using thin slice collimation for electromagnetic bronchoscopy planning purposes, without intravenous contrast. COMPARISON:  Chest CT 02/23/2020. FINDINGS: Cardiovascular: Heart size is normal. There is no significant pericardial fluid, thickening or pericardial calcification. There is aortic atherosclerosis, as well as atherosclerosis of the great vessels of the mediastinum and the coronary arteries, including calcified atherosclerotic plaque in the left main, left anterior descending and left circumflex coronary arteries. Mediastinum/Nodes: No pathologically enlarged mediastinal or hilar lymph nodes. Please note that accurate exclusion of hilar adenopathy is limited on noncontrast CT scans. Esophagus is unremarkable in appearance. No axillary lymphadenopathy. Lungs/Pleura: In the right upper lobe there is a macrolobulated mass (axial image 58 of series 3 and sagittal image 40 of series 6) measuring 5.2 x 5.1 x 3.7 cm with some irregular  spiculated margins and extension to the overlying pleura both laterally and posteriorly via contact with the major fissure. Scattered areas of peribronchovascular ground-glass attenuation are also noted elsewhere throughout the right upper lobe. No confluent consolidative airspace disease. No pleural effusions. Upper Abdomen: Aortic atherosclerosis. 1.3 x 0.9 cm low-attenuation lesion in segment 4B of the liver, incompletely characterized on today's non-contrast CT examination, but statistically likely to represent a cyst. Musculoskeletal: There are no aggressive appearing lytic or blastic lesions noted in the visualized portions of the skeleton. IMPRESSION: 1. 5.2 x 5.1 x 3.7 cm right upper  lobe mass highly concerning for primary bronchogenic carcinoma. No definite lymphadenopathy. 2. Aortic atherosclerosis, in addition to left main and 2 vessel coronary artery disease. Please note that although the presence of coronary artery calcium documents the presence of coronary artery disease, the severity of this disease and any potential stenosis cannot be assessed on this non-gated CT examination. Assessment for potential risk factor modification, dietary therapy or pharmacologic therapy may be warranted, if clinically indicated. Aortic Atherosclerosis (ICD10-I70.0). Electronically Signed   By: Vinnie Langton M.D.   On: 03/02/2020 11:14   DG C-ARM BRONCHOSCOPY  Result Date: 03/02/2020 C-ARM BRONCHOSCOPY: Fluoroscopy was utilized by the requesting physician.  No radiographic interpretation.       I have independently reviewed the above radiology studies  and reviewed the findings with the patient.   Recent Lab Findings: Lab Results  Component Value Date   WBC 8.5 03/14/2020   HGB 14.4 03/14/2020   HCT 42.7 03/14/2020   PLT 320 03/14/2020   GLUCOSE 87 03/14/2020   CHOL 192 02/22/2020   TRIG 81 02/22/2020   HDL 44 02/22/2020   LDLCALC 133 (H) 02/22/2020   ALT 23 03/14/2020   AST 16 03/14/2020   NA  141 03/14/2020   K 4.0 03/14/2020   CL 104 03/14/2020   CREATININE 0.93 03/14/2020   BUN 16 03/14/2020   CO2 29 03/14/2020   TSH 0.509 02/22/2020     PFTs: - FVC: 96% - FEV1: 86% -DLCO: 114%  Problem List: 5.2 cm right upper lobe pulmonary mass.  Biopsy is consistent with non-small cell lung cancer.  Assessment / Plan:   45 year old male with stage IIa, T2b N0 M0 non-small cell lung cancer of the right upper lobe.  The risks and benefits of a robotic assisted right thoracoscopy with right upper lobectomy were discussed.  He is agreeable to proceed.  On review of his PET/CT there is some evidence of calcification in his LAD.  For this reason I have ordered a stress test to rule out any coronary disease.  He is tentatively scheduled for March 28, 2020.     I  spent 55 minutes with  the patient face to face in counseling and coordination of care.    [name] 03/18/2020 5:12 PM

## 2020-03-21 ENCOUNTER — Other Ambulatory Visit: Payer: Self-pay | Admitting: *Deleted

## 2020-03-21 ENCOUNTER — Other Ambulatory Visit: Payer: Self-pay | Admitting: Thoracic Surgery (Cardiothoracic Vascular Surgery)

## 2020-03-21 DIAGNOSIS — C3411 Malignant neoplasm of upper lobe, right bronchus or lung: Secondary | ICD-10-CM

## 2020-03-21 DIAGNOSIS — R918 Other nonspecific abnormal finding of lung field: Secondary | ICD-10-CM

## 2020-03-22 ENCOUNTER — Other Ambulatory Visit: Payer: Self-pay

## 2020-03-22 ENCOUNTER — Ambulatory Visit (HOSPITAL_COMMUNITY)
Admission: RE | Admit: 2020-03-22 | Discharge: 2020-03-22 | Disposition: A | Discharge status: Home / Self Care | Payer: 59 | Source: Ambulatory Visit | Attending: Cardiovascular Disease | Admitting: Cardiovascular Disease

## 2020-03-22 DIAGNOSIS — Z01810 Encounter for preprocedural cardiovascular examination: Secondary | ICD-10-CM

## 2020-03-22 DIAGNOSIS — C3411 Malignant neoplasm of upper lobe, right bronchus or lung: Secondary | ICD-10-CM | POA: Diagnosis not present

## 2020-03-22 LAB — MYOCARDIAL PERFUSION IMAGING
LV dias vol: 153 mL (ref 62–150)
LV sys vol: 61 mL
Peak HR: 94 {beats}/min
Rest HR: 63 {beats}/min
SDS: 0
SRS: 0
SSS: 0
TID: 1.1

## 2020-03-22 MED ORDER — REGADENOSON 0.4 MG/5ML IV SOLN
0.4000 mg | Freq: Once | INTRAVENOUS | Status: AC
  Administered 2020-03-22: 0.4 mg via INTRAVENOUS

## 2020-03-22 MED ORDER — TECHNETIUM TC 99M TETROFOSMIN IV KIT
31.8000 | PACK | Freq: Once | INTRAVENOUS | Status: AC | PRN
  Administered 2020-03-22: 31.8 via INTRAVENOUS
  Filled 2020-03-22: qty 32

## 2020-03-22 MED ORDER — TECHNETIUM TC 99M TETROFOSMIN IV KIT
10.9000 | PACK | Freq: Once | INTRAVENOUS | Status: AC | PRN
  Administered 2020-03-22: 10.9 via INTRAVENOUS
  Filled 2020-03-22: qty 11

## 2020-03-23 NOTE — Pre-Procedure Instructions (Signed)
Jerry [name]  03/23/2020    Your procedure is scheduled on Mon., March 28, 2020 from 7:45AM-11:15AM  Report to [facility] Entrance "A" at 5:45AM  Call this number if you have problems the morning of surgery:  [phone]   Remember:  Do not eat or drink after midnight on March 20th    Take these medicines the morning of surgery with A SIP OF WATER: NONE  As of today, STOP taking all Aspirin (Unless instructed by your doctor) and Other Aspirin containing products, Vitamins, Fish oils, and Herbal medications. Also stop all NSAIDS i.e. Advil, Ibuprofen, Motrin, Aleve, Anaprox, Naproxen, BC, Goody Powders, and all Supplements.   No Smoking of any kind, Tobacco/Vaping, or Alcohol products 24 hours prior to your procedure. If you use a Cpap at night, you may bring all equipment for your overnight stay.    Day of Surgery:  Do not wear jewelry.  Do not wear lotions, powders, colognes, or deodorant.  Do not shave 48 hours prior to surgery.  Men may shave face and neck.  Do not bring valuables to the hospital.  [facility] is not responsible for any belongings or valuables.  Contacts, dentures or bridgework may not be worn into surgery.   For patients admitted to the hospital, discharge time will be determined by your treatment team.  Patients discharged the day of surgery will not be allowed to drive home.    Special instructions:   [facility]- Preparing For Surgery  Before surgery, you can play an important role. Because skin is not sterile, your skin needs to be as free of germs as possible. You can reduce the number of germs on your skin by washing with CHG (chlorahexidine gluconate) Soap before surgery.  CHG is an antiseptic cleaner which kills germs and bonds with the skin to continue killing germs even after washing.    Oral Hygiene is also important to reduce your risk of infection.  Remember - BRUSH YOUR TEETH THE MORNING OF SURGERY WITH YOUR REGULAR  TOOTHPASTE  Please do not use if you have an allergy to CHG or antibacterial soaps. If your skin becomes reddened/irritated stop using the CHG.  Do not shave (including legs and underarms) for at least 48 hours prior to first CHG shower. It is OK to shave your face.  Please follow these instructions carefully.   1. Shower the NIGHT BEFORE SURGERY and the MORNING OF SURGERY with CHG.   2. If you chose to wash your hair, wash your hair first as usual with your normal shampoo.  3. After you shampoo, rinse your hair and body thoroughly to remove the shampoo.  4. Use CHG as you would any other liquid soap. You can apply CHG directly to the skin and wash gently with a scrungie or a clean washcloth.   5. Apply the CHG Soap to your body ONLY FROM THE NECK DOWN.  Do not use on open wounds or open sores. Avoid contact with your eyes, ears, mouth and genitals (private parts). Wash Face and genitals (private parts)  with your normal soap.  6. Wash thoroughly, paying special attention to the area where your surgery will be performed.  7. Thoroughly rinse your body with warm water from the neck down.  8. DO NOT shower/wash with your normal soap after using and rinsing off the CHG Soap.  9. Pat yourself dry with a CLEAN TOWEL.  10. Wear CLEAN PAJAMAS to bed the night before surgery, wear comfortable  clothes the morning of surgery  11. Place CLEAN SHEETS on your bed the night of your first shower and DO NOT SLEEP WITH PETS.  Reminders: Do not apply any deodorants/lotions.  Please wear clean clothes to the hospital/surgery center.   Remember to brush your teeth WITH YOUR REGULAR TOOTHPASTE.  Please read over the following fact sheets that you were given.

## 2020-03-24 ENCOUNTER — Other Ambulatory Visit: Payer: Self-pay

## 2020-03-24 ENCOUNTER — Encounter (HOSPITAL_COMMUNITY)
Admission: RE | Admit: 2020-03-24 | Discharge: 2020-03-24 | Disposition: A | Discharge status: Home / Self Care | Payer: 59 | Source: Ambulatory Visit | Attending: Thoracic Surgery (Cardiothoracic Vascular Surgery) | Admitting: Thoracic Surgery (Cardiothoracic Vascular Surgery)

## 2020-03-24 ENCOUNTER — Other Ambulatory Visit (HOSPITAL_COMMUNITY)
Admission: RE | Admit: 2020-03-24 | Discharge: 2020-03-24 | Disposition: A | Discharge status: Home / Self Care | Payer: 59 | Source: Ambulatory Visit | Attending: Thoracic Surgery (Cardiothoracic Vascular Surgery) | Admitting: Thoracic Surgery (Cardiothoracic Vascular Surgery)

## 2020-03-24 ENCOUNTER — Encounter (HOSPITAL_COMMUNITY): Payer: Self-pay

## 2020-03-24 DIAGNOSIS — Z01818 Encounter for other preprocedural examination: Secondary | ICD-10-CM | POA: Diagnosis present

## 2020-03-24 DIAGNOSIS — C3411 Malignant neoplasm of upper lobe, right bronchus or lung: Secondary | ICD-10-CM | POA: Insufficient documentation

## 2020-03-24 DIAGNOSIS — Z01812 Encounter for preprocedural laboratory examination: Secondary | ICD-10-CM | POA: Insufficient documentation

## 2020-03-24 DIAGNOSIS — Z20822 Contact with and (suspected) exposure to covid-19: Secondary | ICD-10-CM | POA: Insufficient documentation

## 2020-03-24 HISTORY — DX: Malignant (primary) neoplasm, unspecified: C80.1

## 2020-03-24 LAB — BLOOD GAS, ARTERIAL
Acid-Base Excess: 2.1 mmol/L — ABNORMAL HIGH (ref 0.0–2.0)
Bicarbonate: 26 mmol/L (ref 20.0–28.0)
FIO2: 21
O2 Saturation: 97 %
Patient temperature: 37
pCO2 arterial: 40.2 mmHg (ref 32.0–48.0)
pH, Arterial: 7.427 (ref 7.350–7.450)
pO2, Arterial: 85.7 mmHg (ref 83.0–108.0)

## 2020-03-24 LAB — URINALYSIS, ROUTINE W REFLEX MICROSCOPIC
Bilirubin Urine: NEGATIVE
Glucose, UA: NEGATIVE mg/dL
Hgb urine dipstick: NEGATIVE
Ketones, ur: NEGATIVE mg/dL
Leukocytes,Ua: NEGATIVE
Nitrite: NEGATIVE
Protein, ur: NEGATIVE mg/dL
Specific Gravity, Urine: 1.02 (ref 1.005–1.030)
pH: 5 (ref 5.0–8.0)

## 2020-03-24 LAB — TYPE AND SCREEN
ABO/RH(D): A POS
Antibody Screen: NEGATIVE

## 2020-03-24 LAB — CBC
HCT: 43.3 % (ref 39.0–52.0)
Hemoglobin: 14.7 g/dL (ref 13.0–17.0)
MCH: 30.4 pg (ref 26.0–34.0)
MCHC: 33.9 g/dL (ref 30.0–36.0)
MCV: 89.6 fL (ref 80.0–100.0)
Platelets: 273 10*3/uL (ref 150–400)
RBC: 4.83 MIL/uL (ref 4.22–5.81)
RDW: 12.4 % (ref 11.5–15.5)
WBC: 5.8 10*3/uL (ref 4.0–10.5)
nRBC: 0 % (ref 0.0–0.2)

## 2020-03-24 LAB — SURGICAL PCR SCREEN
MRSA, PCR: NEGATIVE
Staphylococcus aureus: NEGATIVE

## 2020-03-24 LAB — APTT: aPTT: 27 seconds (ref 24–36)

## 2020-03-24 LAB — COMPREHENSIVE METABOLIC PANEL
ALT: 31 U/L (ref 0–44)
AST: 25 U/L (ref 15–41)
Albumin: 4.2 g/dL (ref 3.5–5.0)
Alkaline Phosphatase: 50 U/L (ref 38–126)
Anion gap: 12 (ref 5–15)
BUN: 16 mg/dL (ref 6–20)
CO2: 23 mmol/L (ref 22–32)
Calcium: 9.7 mg/dL (ref 8.9–10.3)
Chloride: 104 mmol/L (ref 98–111)
Creatinine, Ser: 0.87 mg/dL (ref 0.61–1.24)
GFR, Estimated: >60 mL/min (ref >60)
Glucose, Bld: 88 mg/dL (ref 70–99)
Potassium: 4 mmol/L (ref 3.5–5.1)
Sodium: 139 mmol/L (ref 135–145)
Total Bilirubin: 1 mg/dL (ref 0.3–1.2)
Total Protein: 7 g/dL (ref 6.5–8.1)

## 2020-03-24 LAB — PROTIME-INR
INR: 1 (ref 0.8–1.2)
Prothrombin Time: 12.7 seconds (ref 11.4–15.2)

## 2020-03-24 LAB — SARS CORONAVIRUS 2 (TAT 6-24 HRS): SARS Coronavirus 2: NEGATIVE

## 2020-03-24 NOTE — Progress Notes (Signed)
PCP: Jerrell Belfast, MD Cardiologist: Patient had stress test ordered by Dr. Kipp Brood, but does not know who cardiologist was.  Performed due to patient having fluttering feeling.  Patient states it was normal.   EKG: 03/24/20 CXR: 03/24/20 ECHO: denies Stress Test:  03/22/20 CE Cardiac Cath: denies  Fasting Blood Sugar- na Checks Blood Sugar_na__ times a day  OSA/CPAP:  No  ASA/Blood Thinners:  No  Covid test 03/24/20 testing site  Anesthesia Review:  No  Patient denies shortness of breath, fever, cough, and chest pain at PAT appointment.  Patient verbalized understanding of instructions provided today at the PAT appointment.  Patient asked to review instructions at home and day of surgery.

## 2020-03-25 MED ORDER — DEXTROSE 5 % IV SOLN
3.0000 g | INTRAVENOUS | Status: AC
  Administered 2020-03-28: 2 g via INTRAVENOUS
  Administered 2020-03-28: 3 g via INTRAVENOUS
  Filled 2020-03-25: qty 4.29
  Filled 2020-03-25: qty 3000

## 2020-03-28 ENCOUNTER — Other Ambulatory Visit: Payer: Self-pay

## 2020-03-28 ENCOUNTER — Inpatient Hospital Stay (HOSPITAL_COMMUNITY): Payer: 59

## 2020-03-28 ENCOUNTER — Inpatient Hospital Stay (HOSPITAL_COMMUNITY): Payer: 59 | Admitting: Anesthesiology

## 2020-03-28 ENCOUNTER — Inpatient Hospital Stay (HOSPITAL_COMMUNITY)
Admission: RE | Admit: 2020-03-28 | Discharge: 2020-03-31 | DRG: 164 | Disposition: A | Discharge status: Home / Self Care | Payer: 59 | Attending: Thoracic Surgery (Cardiothoracic Vascular Surgery) | Admitting: Thoracic Surgery (Cardiothoracic Vascular Surgery)

## 2020-03-28 ENCOUNTER — Encounter (HOSPITAL_COMMUNITY): Payer: Self-pay | Admitting: Thoracic Surgery (Cardiothoracic Vascular Surgery)

## 2020-03-28 ENCOUNTER — Encounter (HOSPITAL_COMMUNITY)
Admission: RE | Disposition: A | Discharge status: Home / Self Care | Payer: Self-pay | Source: Home / Self Care | Attending: Thoracic Surgery (Cardiothoracic Vascular Surgery)

## 2020-03-28 DIAGNOSIS — Z902 Acquired absence of lung [part of]: Secondary | ICD-10-CM

## 2020-03-28 DIAGNOSIS — Z801 Family history of malignant neoplasm of trachea, bronchus and lung: Secondary | ICD-10-CM | POA: Diagnosis not present

## 2020-03-28 DIAGNOSIS — Z8249 Family history of ischemic heart disease and other diseases of the circulatory system: Secondary | ICD-10-CM

## 2020-03-28 DIAGNOSIS — J95812 Postprocedural air leak: Secondary | ICD-10-CM | POA: Diagnosis not present

## 2020-03-28 DIAGNOSIS — C3411 Malignant neoplasm of upper lobe, right bronchus or lung: Principal | ICD-10-CM | POA: Diagnosis present

## 2020-03-28 DIAGNOSIS — R059 Cough, unspecified: Secondary | ICD-10-CM | POA: Diagnosis not present

## 2020-03-28 DIAGNOSIS — Z823 Family history of stroke: Secondary | ICD-10-CM

## 2020-03-28 DIAGNOSIS — Z87891 Personal history of nicotine dependence: Secondary | ICD-10-CM | POA: Diagnosis not present

## 2020-03-28 DIAGNOSIS — Z83438 Family history of other disorder of lipoprotein metabolism and other lipidemia: Secondary | ICD-10-CM | POA: Diagnosis not present

## 2020-03-28 DIAGNOSIS — Z9689 Presence of other specified functional implants: Secondary | ICD-10-CM

## 2020-03-28 DIAGNOSIS — C3491 Malignant neoplasm of unspecified part of right bronchus or lung: Secondary | ICD-10-CM

## 2020-03-28 DIAGNOSIS — Z09 Encounter for follow-up examination after completed treatment for conditions other than malignant neoplasm: Secondary | ICD-10-CM

## 2020-03-28 HISTORY — PX: INTERCOSTAL NERVE BLOCK: SHX5021

## 2020-03-28 HISTORY — PX: LYMPH NODE DISSECTION: SHX5087

## 2020-03-28 LAB — POCT I-STAT 7, (LYTES, BLD GAS, ICA,H+H)
Acid-base deficit: 1 mmol/L (ref 0.0–2.0)
Bicarbonate: 26.9 mmol/L (ref 20.0–28.0)
Calcium, Ion: 1.24 mmol/L (ref 1.15–1.40)
HCT: 41 % (ref 39.0–52.0)
Hemoglobin: 13.9 g/dL (ref 13.0–17.0)
O2 Saturation: 91 %
Potassium: 4.2 mmol/L (ref 3.5–5.1)
Sodium: 141 mmol/L (ref 135–145)
TCO2: 29 mmol/L (ref 22–32)
pCO2 arterial: 55.1 mmHg — ABNORMAL HIGH (ref 32.0–48.0)
pH, Arterial: 7.296 — ABNORMAL LOW (ref 7.350–7.450)
pO2, Arterial: 70 mmHg — ABNORMAL LOW (ref 83.0–108.0)

## 2020-03-28 LAB — ABO/RH: ABO/RH(D): A POS

## 2020-03-28 SURGERY — LOBECTOMY, LUNG, ROBOT-ASSISTED, USING VATS
Anesthesia: General | Site: Chest | Laterality: Right

## 2020-03-28 MED ORDER — HEPARIN SODIUM (PORCINE) 1000 UNIT/ML IJ SOLN
INTRAMUSCULAR | Status: AC
  Filled 2020-03-28: qty 1

## 2020-03-28 MED ORDER — BISACODYL 5 MG PO TBEC
10.0000 mg | DELAYED_RELEASE_TABLET | Freq: Every day | ORAL | Status: DC
  Administered 2020-03-29 – 2020-03-31 (×3): 10 mg via ORAL
  Filled 2020-03-28 (×3): qty 2

## 2020-03-28 MED ORDER — SODIUM CHLORIDE 0.9 % IV SOLN: INTRAVENOUS | Status: DC | PRN

## 2020-03-28 MED ORDER — SODIUM CHLORIDE 0.9 % IV SOLN
INTRAVENOUS | Status: DC | PRN
  Administered 2020-03-28: 50 ug/min via INTRAVENOUS

## 2020-03-28 MED ORDER — FENTANYL CITRATE (PF) 250 MCG/5ML IJ SOLN
INTRAMUSCULAR | Status: AC
  Filled 2020-03-28: qty 5

## 2020-03-28 MED ORDER — MIDAZOLAM HCL 2 MG/2ML IJ SOLN
INTRAMUSCULAR | Status: AC
  Filled 2020-03-28: qty 2

## 2020-03-28 MED ORDER — ONDANSETRON HCL 4 MG/2ML IJ SOLN
INTRAMUSCULAR | Status: AC
  Filled 2020-03-28: qty 2

## 2020-03-28 MED ORDER — PROMETHAZINE HCL 25 MG/ML IJ SOLN: 6.2500 mg | INTRAMUSCULAR | Status: DC | PRN

## 2020-03-28 MED ORDER — TRAMADOL HCL 50 MG PO TABS
50.0000 mg | ORAL_TABLET | Freq: Four times a day (QID) | ORAL | Status: DC | PRN
  Administered 2020-03-28: 100 mg via ORAL
  Administered 2020-03-29 (×4): 50 mg via ORAL
  Administered 2020-03-30 (×3): 100 mg via ORAL
  Administered 2020-03-30: 50 mg via ORAL
  Administered 2020-03-31: 100 mg via ORAL
  Filled 2020-03-28 (×3): qty 2
  Filled 2020-03-28: qty 1
  Filled 2020-03-28: qty 2
  Filled 2020-03-28 (×2): qty 1
  Filled 2020-03-28 (×2): qty 2
  Filled 2020-03-28: qty 1

## 2020-03-28 MED ORDER — SENNOSIDES-DOCUSATE SODIUM 8.6-50 MG PO TABS
1.0000 | ORAL_TABLET | Freq: Every day | ORAL | Status: DC
  Administered 2020-03-29 – 2020-03-30 (×2): 1 via ORAL
  Filled 2020-03-28 (×2): qty 1

## 2020-03-28 MED ORDER — LACTATED RINGERS IV SOLN: INTRAVENOUS | Status: DC

## 2020-03-28 MED ORDER — DEXAMETHASONE SODIUM PHOSPHATE 10 MG/ML IJ SOLN
INTRAMUSCULAR | Status: AC
  Filled 2020-03-28: qty 1

## 2020-03-28 MED ORDER — ROCURONIUM BROMIDE 10 MG/ML (PF) SYRINGE
PREFILLED_SYRINGE | INTRAVENOUS | Status: DC | PRN
  Administered 2020-03-28: 30 mg via INTRAVENOUS
  Administered 2020-03-28: 20 mg via INTRAVENOUS
  Administered 2020-03-28 (×2): 30 mg via INTRAVENOUS
  Administered 2020-03-28: 20 mg via INTRAVENOUS
  Administered 2020-03-28: 30 mg via INTRAVENOUS
  Administered 2020-03-28: 70 mg via INTRAVENOUS

## 2020-03-28 MED ORDER — BUPIVACAINE HCL (PF) 0.5 % IJ SOLN
INTRAMUSCULAR | Status: AC
  Filled 2020-03-28: qty 30

## 2020-03-28 MED ORDER — PROPOFOL 10 MG/ML IV BOLUS
INTRAVENOUS | Status: DC | PRN
  Administered 2020-03-28: 50 mg via INTRAVENOUS
  Administered 2020-03-28: 150 mg via INTRAVENOUS

## 2020-03-28 MED ORDER — FENTANYL CITRATE (PF) 100 MCG/2ML IJ SOLN
25.0000 ug | INTRAMUSCULAR | Status: DC | PRN
  Administered 2020-03-28 (×2): 50 ug via INTRAVENOUS

## 2020-03-28 MED ORDER — ACETAMINOPHEN 160 MG/5ML PO SOLN: 1000.0000 mg | Freq: Four times a day (QID) | ORAL | Status: DC

## 2020-03-28 MED ORDER — LIDOCAINE 2% (20 MG/ML) 5 ML SYRINGE
INTRAMUSCULAR | Status: DC | PRN
  Administered 2020-03-28: 80 mg via INTRAVENOUS

## 2020-03-28 MED ORDER — MIDAZOLAM HCL 5 MG/5ML IJ SOLN
INTRAMUSCULAR | Status: DC | PRN
  Administered 2020-03-28: 2 mg via INTRAVENOUS

## 2020-03-28 MED ORDER — FENTANYL CITRATE (PF) 250 MCG/5ML IJ SOLN
INTRAMUSCULAR | Status: DC | PRN
  Administered 2020-03-28 (×2): 50 ug via INTRAVENOUS
  Administered 2020-03-28: 100 ug via INTRAVENOUS
  Administered 2020-03-28: 50 ug via INTRAVENOUS
  Administered 2020-03-28 (×2): 100 ug via INTRAVENOUS
  Administered 2020-03-28: 50 ug via INTRAVENOUS

## 2020-03-28 MED ORDER — ORAL CARE MOUTH RINSE: 15.0000 mL | Freq: Once | OROMUCOSAL | Status: AC

## 2020-03-28 MED ORDER — ALBUTEROL SULFATE (2.5 MG/3ML) 0.083% IN NEBU
2.5000 mg | INHALATION_SOLUTION | RESPIRATORY_TRACT | Status: DC
  Administered 2020-03-28: 2.5 mg via RESPIRATORY_TRACT
  Filled 2020-03-28: qty 3

## 2020-03-28 MED ORDER — CHLORHEXIDINE GLUCONATE 0.12 % MT SOLN: 15.0000 mL | Freq: Once | OROMUCOSAL | Status: AC

## 2020-03-28 MED ORDER — PROPOFOL 10 MG/ML IV BOLUS
INTRAVENOUS | Status: AC
  Filled 2020-03-28: qty 40

## 2020-03-28 MED ORDER — BUPIVACAINE LIPOSOME 1.3 % IJ SUSP
INTRAMUSCULAR | Status: AC
  Filled 2020-03-28: qty 20

## 2020-03-28 MED ORDER — DEXAMETHASONE SODIUM PHOSPHATE 10 MG/ML IJ SOLN
INTRAMUSCULAR | Status: DC | PRN
  Administered 2020-03-28: 10 mg via INTRAVENOUS

## 2020-03-28 MED ORDER — ROCURONIUM BROMIDE 10 MG/ML (PF) SYRINGE
PREFILLED_SYRINGE | INTRAVENOUS | Status: AC
  Filled 2020-03-28: qty 10

## 2020-03-28 MED ORDER — CHLORHEXIDINE GLUCONATE 0.12 % MT SOLN
OROMUCOSAL | Status: AC
  Administered 2020-03-28: 15 mL via OROMUCOSAL
  Filled 2020-03-28: qty 15

## 2020-03-28 MED ORDER — BUPIVACAINE LIPOSOME 1.3 % IJ SUSP
INTRAMUSCULAR | Status: DC | PRN
  Administered 2020-03-28: 100 mL

## 2020-03-28 MED ORDER — FENTANYL CITRATE (PF) 100 MCG/2ML IJ SOLN
INTRAMUSCULAR | Status: AC
  Filled 2020-03-28: qty 2

## 2020-03-28 MED ORDER — ACETAMINOPHEN 500 MG PO TABS
1000.0000 mg | ORAL_TABLET | Freq: Four times a day (QID) | ORAL | Status: DC
  Administered 2020-03-28 – 2020-03-31 (×12): 1000 mg via ORAL
  Filled 2020-03-28 (×12): qty 2

## 2020-03-28 MED ORDER — ONDANSETRON HCL 4 MG/2ML IJ SOLN: 4.0000 mg | Freq: Four times a day (QID) | INTRAMUSCULAR | Status: DC | PRN

## 2020-03-28 MED ORDER — KETOROLAC TROMETHAMINE 15 MG/ML IJ SOLN
15.0000 mg | Freq: Four times a day (QID) | INTRAMUSCULAR | Status: AC
  Administered 2020-03-28 – 2020-03-30 (×8): 15 mg via INTRAVENOUS
  Filled 2020-03-28 (×8): qty 1

## 2020-03-28 MED ORDER — ENOXAPARIN SODIUM 40 MG/0.4ML ~~LOC~~ SOLN
40.0000 mg | Freq: Every day | SUBCUTANEOUS | Status: DC
  Administered 2020-03-29 – 2020-03-31 (×3): 40 mg via SUBCUTANEOUS
  Filled 2020-03-28 (×3): qty 0.4

## 2020-03-28 MED ORDER — LACTATED RINGERS IV SOLN: INTRAVENOUS | Status: DC | PRN

## 2020-03-28 MED ORDER — CEFAZOLIN SODIUM-DEXTROSE 2-4 GM/100ML-% IV SOLN
2.0000 g | Freq: Three times a day (TID) | INTRAVENOUS | Status: AC
  Administered 2020-03-28 – 2020-03-29 (×2): 2 g via INTRAVENOUS
  Filled 2020-03-28 (×2): qty 100

## 2020-03-28 MED ORDER — ALBUMIN HUMAN 5 % IV SOLN: INTRAVENOUS | Status: DC | PRN

## 2020-03-28 MED ORDER — ONDANSETRON HCL 4 MG/2ML IJ SOLN
INTRAMUSCULAR | Status: DC | PRN
  Administered 2020-03-28: 4 mg via INTRAVENOUS

## 2020-03-28 MED ORDER — ACETAMINOPHEN 500 MG PO TABS
1000.0000 mg | ORAL_TABLET | Freq: Once | ORAL | Status: AC
  Administered 2020-03-28: 1000 mg via ORAL
  Filled 2020-03-28: qty 2

## 2020-03-28 MED ORDER — 0.9 % SODIUM CHLORIDE (POUR BTL) OPTIME
TOPICAL | Status: DC | PRN
  Administered 2020-03-28: 1000 mL

## 2020-03-28 MED ORDER — BUPIVACAINE HCL (PF) 0.25 % IJ SOLN
INTRAMUSCULAR | Status: AC
  Filled 2020-03-28: qty 30

## 2020-03-28 SURGICAL SUPPLY — 134 items
ADH SKN CLS APL DERMABOND .7 (GAUZE/BANDAGES/DRESSINGS) ×1
APL PRP STRL LF DISP 70% ISPRP (MISCELLANEOUS) ×1
BAG SPEC RTRVL C1550 15 (MISCELLANEOUS) ×1
BAG SPEC RTRVL LRG 6X4 10 (ENDOMECHANICALS)
BLADE CLIPPER SURG (BLADE) ×1 IMPLANT
BLADE SURG 11 STRL SS (BLADE) ×3 IMPLANT
BNDG COHESIVE 6X5 TAN STRL LF (GAUZE/BANDAGES/DRESSINGS) ×1 IMPLANT
CANISTER SUCT 3000ML PPV (MISCELLANEOUS) ×6 IMPLANT
CANNULA REDUC XI 12-8 STAPL (CANNULA) ×4
CANNULA REDUC XI 12-8MM STAPL (CANNULA) ×2
CANNULA REDUCER 12-8 DVNC XI (CANNULA) ×2 IMPLANT
CATH THORACIC 28FR (CATHETERS) IMPLANT
CATH THORACIC 28FR RT ANG (CATHETERS) IMPLANT
CATH THORACIC 36FR (CATHETERS) IMPLANT
CATH THORACIC 36FR RT ANG (CATHETERS) IMPLANT
CATH TROCAR 20FR (CATHETERS) IMPLANT
CHLORAPREP W/TINT 26 (MISCELLANEOUS) ×3 IMPLANT
CLIP VESOCCLUDE MED 6/CT (CLIP) IMPLANT
CNTNR URN SCR LID CUP LEK RST (MISCELLANEOUS) ×5 IMPLANT
CONN ST 1/4X3/8  BEN (MISCELLANEOUS)
CONN ST 1/4X3/8 BEN (MISCELLANEOUS) IMPLANT
CONN Y 3/8X3/8X3/8  BEN (MISCELLANEOUS)
CONN Y 3/8X3/8X3/8 BEN (MISCELLANEOUS) IMPLANT
CONT SPEC 4OZ STRL OR WHT (MISCELLANEOUS) ×27
COVER SURGICAL LIGHT HANDLE (MISCELLANEOUS) IMPLANT
DEFOGGER SCOPE WARMER CLEARIFY (MISCELLANEOUS) ×3 IMPLANT
DERMABOND ADVANCED (GAUZE/BANDAGES/DRESSINGS) ×2
DERMABOND ADVANCED .7 DNX12 (GAUZE/BANDAGES/DRESSINGS) ×1 IMPLANT
DISSECTOR BLUNT TIP ENDO 5MM (MISCELLANEOUS) IMPLANT
DRAIN CHANNEL 28F RND 3/8 FF (WOUND CARE) IMPLANT
DRAIN CHANNEL 32F RND 10.7 FF (WOUND CARE) IMPLANT
DRAPE ARM DVNC X/XI (DISPOSABLE) ×4 IMPLANT
DRAPE COLUMN DVNC XI (DISPOSABLE) ×1 IMPLANT
DRAPE CV SPLIT W-CLR ANES SCRN (DRAPES) ×3 IMPLANT
DRAPE DA VINCI XI ARM (DISPOSABLE) ×12
DRAPE DA VINCI XI COLUMN (DISPOSABLE) ×3
DRAPE HALF SHEET 40X57 (DRAPES) ×2 IMPLANT
DRAPE ORTHO SPLIT 77X108 STRL (DRAPES) ×3
DRAPE SURG ORHT 6 SPLT 77X108 (DRAPES) ×1 IMPLANT
ELECT BLADE 6.5 EXT (BLADE) IMPLANT
ELECT REM PT RETURN 9FT ADLT (ELECTROSURGICAL) ×3
ELECTRODE REM PT RTRN 9FT ADLT (ELECTROSURGICAL) ×1 IMPLANT
GAUZE KITTNER 4X10 (MISCELLANEOUS) ×3 IMPLANT
GAUZE KITTNER 4X5 RF (MISCELLANEOUS) ×4 IMPLANT
GAUZE KITTNER 4X8 (MISCELLANEOUS) IMPLANT
GAUZE SPONGE 4X4 12PLY STRL (GAUZE/BANDAGES/DRESSINGS) ×3 IMPLANT
GAUZE SPONGE 4X4 12PLY STRL LF (GAUZE/BANDAGES/DRESSINGS) ×2 IMPLANT
GLOVE BIO SURGEON STRL SZ7.5 (GLOVE) ×6 IMPLANT
GOWN STRL REUS W/ TWL LRG LVL3 (GOWN DISPOSABLE) ×2 IMPLANT
GOWN STRL REUS W/ TWL XL LVL3 (GOWN DISPOSABLE) ×3 IMPLANT
GOWN STRL REUS W/TWL 2XL LVL3 (GOWN DISPOSABLE) ×3 IMPLANT
GOWN STRL REUS W/TWL LRG LVL3 (GOWN DISPOSABLE) ×6
GOWN STRL REUS W/TWL XL LVL3 (GOWN DISPOSABLE) ×9
HEMOSTAT SURGICEL 2X14 (HEMOSTASIS) ×7 IMPLANT
IRRIGATION STRYKERFLOW (MISCELLANEOUS) IMPLANT
IRRIGATOR STRYKERFLOW (MISCELLANEOUS)
IRRIGATOR SUCT 8 DISP DVNC XI (IRRIGATION / IRRIGATOR) IMPLANT
IRRIGATOR SUCTION 8MM XI DISP (IRRIGATION / IRRIGATOR) ×3
KIT BASIN OR (CUSTOM PROCEDURE TRAY) ×3 IMPLANT
KIT SUCTION CATH 14FR (SUCTIONS) IMPLANT
KIT TURNOVER KIT B (KITS) ×3 IMPLANT
LOOP VESSEL SUPERMAXI WHITE (MISCELLANEOUS) IMPLANT
NEEDLE 22X1 1/2 (OR ONLY) (NEEDLE) ×3 IMPLANT
NS IRRIG 1000ML POUR BTL (IV SOLUTION) ×9 IMPLANT
PACK CHEST (CUSTOM PROCEDURE TRAY) ×3 IMPLANT
PAD ARMBOARD 7.5X6 YLW CONV (MISCELLANEOUS) ×15 IMPLANT
PORT ACCESS TROCAR AIRSEAL 12 (TROCAR) ×1 IMPLANT
PORT ACCESS TROCAR AIRSEAL 5M (TROCAR) ×2
POUCH ENDO CATCH II 15MM (MISCELLANEOUS) IMPLANT
POUCH SPECIMEN RETRIEVAL 10MM (ENDOMECHANICALS) IMPLANT
RELOAD STAPLE 45 2.0 GRY DVNC (STAPLE) IMPLANT
RELOAD STAPLE 45 2.5 WHT DVNC (STAPLE) IMPLANT
RELOAD STAPLE 45 3.5 BLU DVNC (STAPLE) IMPLANT
RELOAD STAPLE 45 4.3 GRN DVNC (STAPLE) IMPLANT
RELOAD STAPLER 2.5X45 WHT DVNC (STAPLE) ×3 IMPLANT
RELOAD STAPLER 3.5X45 BLU DVNC (STAPLE) ×13 IMPLANT
RELOAD STAPLER 4.3X45 GRN DVNC (STAPLE) ×2 IMPLANT
RETRACTOR WOUND ALXS 19CM XSML (INSTRUMENTS) IMPLANT
RTRCTR WOUND ALEXIS 19CM XSML (INSTRUMENTS)
SCISSORS LAP 5X35 DISP (ENDOMECHANICALS) IMPLANT
SEAL CANN UNIV 5-8 DVNC XI (MISCELLANEOUS) ×2 IMPLANT
SEAL XI 5MM-8MM UNIVERSAL (MISCELLANEOUS) ×6
SEALANT PROGEL (MISCELLANEOUS) IMPLANT
SEALANT SURG COSEAL 4ML (VASCULAR PRODUCTS) IMPLANT
SEALANT SURG COSEAL 8ML (VASCULAR PRODUCTS) IMPLANT
SEALER LIGASURE MARYLAND 30 (ELECTROSURGICAL) IMPLANT
SET TRI-LUMEN FLTR TB AIRSEAL (TUBING) ×3 IMPLANT
SOLUTION ELECTROLUBE (MISCELLANEOUS) IMPLANT
SPONGE INTESTINAL PEANUT (DISPOSABLE) IMPLANT
SPONGE TONSIL TAPE 1 RFD (DISPOSABLE) IMPLANT
STAPLE RELOAD 45 2.0 GRAY (STAPLE) ×6
STAPLE RELOAD 45 2.0 GRAY DVNC (STAPLE) ×2 IMPLANT
STAPLER 45 SUREFORM CVD (STAPLE) ×6
STAPLER 45 SUREFORM CVD DVNC (STAPLE) IMPLANT
STAPLER CANNULA SEAL DVNC XI (STAPLE) ×2 IMPLANT
STAPLER CANNULA SEAL XI (STAPLE) ×6
STAPLER RELOAD 2.5X45 WHITE (STAPLE) ×9
STAPLER RELOAD 2.5X45 WHT DVNC (STAPLE) ×3
STAPLER RELOAD 3.5X45 BLU DVNC (STAPLE) ×13
STAPLER RELOAD 3.5X45 BLUE (STAPLE) ×39
STAPLER RELOAD 4.3X45 GREEN (STAPLE) ×6
STAPLER RELOAD 4.3X45 GRN DVNC (STAPLE) ×2
STOPCOCK 4 WAY LG BORE MALE ST (IV SETS) ×3 IMPLANT
SUT MNCRL AB 3-0 PS2 18 (SUTURE) IMPLANT
SUT MON AB 2-0 CT1 36 (SUTURE) IMPLANT
SUT PDS AB 1 CTX 36 (SUTURE) IMPLANT
SUT PROLENE 4 0 RB 1 (SUTURE)
SUT PROLENE 4-0 RB1 .5 CRCL 36 (SUTURE) IMPLANT
SUT SILK  1 MH (SUTURE) ×3
SUT SILK 1 MH (SUTURE) ×1 IMPLANT
SUT SILK 1 TIES 10X30 (SUTURE) IMPLANT
SUT SILK 2 0 SH (SUTURE) IMPLANT
SUT SILK 2 0SH CR/8 30 (SUTURE) IMPLANT
SUT VIC AB 1 CTX 36 (SUTURE)
SUT VIC AB 1 CTX36XBRD ANBCTR (SUTURE) IMPLANT
SUT VIC AB 2-0 CT1 27 (SUTURE) ×3
SUT VIC AB 2-0 CT1 TAPERPNT 27 (SUTURE) ×1 IMPLANT
SUT VIC AB 3-0 SH 27 (SUTURE) ×6
SUT VIC AB 3-0 SH 27X BRD (SUTURE) ×2 IMPLANT
SUT VICRYL 0 TIES 12 18 (SUTURE) ×3 IMPLANT
SUT VICRYL 0 UR6 27IN ABS (SUTURE) ×6 IMPLANT
SUT VICRYL 2 TP 1 (SUTURE) IMPLANT
SYR 10ML LL (SYRINGE) ×3 IMPLANT
SYR 20ML LL LF (SYRINGE) ×3 IMPLANT
SYR 50ML LL SCALE MARK (SYRINGE) ×3 IMPLANT
SYR BULB IRRIG 60ML STRL (SYRINGE) ×2 IMPLANT
SYSTEM RETRIEVAL ANCHOR 15 (MISCELLANEOUS) ×2 IMPLANT
SYSTEM SAHARA CHEST DRAIN ATS (WOUND CARE) ×3 IMPLANT
TAPE CLOTH 4X10 WHT NS (GAUZE/BANDAGES/DRESSINGS) ×3 IMPLANT
TAPE PAPER 3X10 WHT MICROPORE (GAUZE/BANDAGES/DRESSINGS) ×2 IMPLANT
TIP APPLICATOR SPRAY EXTEND 16 (VASCULAR PRODUCTS) IMPLANT
TOWEL GREEN STERILE (TOWEL DISPOSABLE) ×3 IMPLANT
TRAY FOLEY MTR SLVR 16FR STAT (SET/KITS/TRAYS/PACK) ×3 IMPLANT
TUBING EXTENTION W/L.L. (IV SETS) ×3 IMPLANT

## 2020-03-28 NOTE — Op Note (Signed)
      [address]       [place] [address]             [phone]        03/28/2020  Patient:  Jerry [name] Pre-Op Dx: Higinio Roger NSCLC of the right upper lobe   Post-op Dx:  none Procedure: - Robotic assisted right video thoracoscopy - right upper lobectomy - Mediastinal lymph node sampling - Intercostal nerve block  Surgeon and Role:      * Lightfoot, Lucile Crater, MD - Primary    Evonnie Pat - assisting Assistant: Leretha Pol, PA-C  Anesthesia  general EBL:  350 ml Blood Administration: none Specimen:  Right upper lobe.  Level 4, 9, 10, and hilar nodes  Drains: 55 F argyle chest tube in right chest Counts: correct   Indications: 45 year old male with stage IIa, T2b N0 M0 non-small cell lung cancer of the right upper lobe.  The risks and benefits of a robotic assisted right thoracoscopy with right upper lobectomy were discussed.  He is agreeable to proceed.  On review of his PET/CT there is some evidence of calcification in his LAD.  For this reason I have ordered a stress test to rule out any coronary disease.  He is tentatively scheduled for March 28, 2020.  Findings: Large right upper lobe mass.  Pleural retraction.  Dense hilar adhesions.  Operative Technique: After the risks, benefits and alternatives were thoroughly discussed, the patient was brought to the operative theatre.  Anesthesia was induced, and the patient was then placed in a left lateral decubitus position and was prepped and draped in normal sterile fashion.  An appropriate surgical pause was performed, and pre-operative antibiotics were dosed accordingly.  We began by placing our 4 robotic ports in the the 7th intercostal space targeting the hilum of the lung.  A 9mm assistant port was placed in the 9th intercostal space in the anterior axillary line.  The robot was then docked and all instruments were passed under direct visualization.    The lung was then retracted superiorly, and the  inferior pulmonary ligament was divided.  The hilum was mobilized anteriorly and posteriorly.  We identified the upper lobe pulmonary vein, and after careful isolation, it was divided with a vascular stapler.  We next moved to the  Pulmonary artery.  The artery was then divided with a vascular load stapler.  The bronchus to the upper lobe was then isolated.  After a test clamp, with good ventilation of the middle and lower lobes, the bronchus was then divided.  The fissure was completed, and the specimen was passed into an endocatch bag.  It was removed from the anterior access site.    Lymph nodes were then sampled at levels 4, 9, 10 and hilum.  The chest was irrigated, and an air leak test was performed.  An intercostal nerve block was performed under direct visualization.  A 64F chest with then placed, and we watch the remaining lobes re-expand.  The skin and soft tissue were closed with absorbable suture    The patient tolerated the procedure without any immediate complications, and was transferred to the PACU in stable condition.  Harrell Bary Leriche

## 2020-03-28 NOTE — Anesthesia Procedure Notes (Signed)
Arterial Line Insertion Start/End3/21/2022 11:00 AM, 03/28/2020 11:10 AM Performed by: CRNA  Patient location: Pre-op. Preanesthetic checklist: patient identified, IV checked, site marked, risks and benefits discussed, surgical consent, monitors and equipment checked, pre-op evaluation, timeout performed and anesthesia consent Lidocaine 1% used for infiltration Left, radial was placed Catheter size: 20 G Hand hygiene performed , maximum sterile barriers used  and Seldinger technique used Allen's test indicative of satisfactory collateral circulation Attempts: 1 Procedure performed without using ultrasound guided technique. Following insertion, dressing applied and Biopatch. Post procedure assessment: normal  Patient tolerated the procedure well with no immediate complications.

## 2020-03-28 NOTE — Brief Op Note (Signed)
03/28/2020  4:12 PM  PATIENT:  Jerry [name]  45 y.o. male  PRE-OPERATIVE DIAGNOSIS:  LUNG CANCER  POST-OPERATIVE DIAGNOSIS:  LUNG CANCER  PROCEDURE:  Procedure(s): XI ROBOTIC ASSISTED THORASCOPY-RIGHT UPPER LOBECTOMY (Right) INTERCOSTAL NERVE BLOCK (Right) LYMPH NODE DISSECTION (Right)  SURGEON:  Surgeon(s) and Role:    * Lightfoot, Lucile Crater, MD - Primary  PHYSICIAN ASSISTANT: Nohea Kras PA-C, ERIN BARRETT PA-C  ASSISTANTS: none   ANESTHESIA:   general  EBL:  150 mL   BLOOD ADMINISTERED:none  DRAINS: 1 28 F Chest Tube(s) in the RIGHT HEMOTHORAX   LOCAL MEDICATIONS USED:  OTHER EXPAREL  SPECIMEN:  Source of Specimen:  RUL AND LN SPECIMINS  DISPOSITION OF SPECIMEN:  PATHOLOGY  COUNTS:  YES  TOURNIQUET:  * No tourniquets in log *  DICTATION: .Dragon Dictation  PLAN OF CARE: Admit to inpatient   PATIENT DISPOSITION:  PACU - hemodynamically stable.   Delay start of Pharmacological VTE agent (>24hrs) due to surgical blood loss or risk of bleeding: yeS  COMPLICATIONS: NO KNOWN

## 2020-03-28 NOTE — Anesthesia Preprocedure Evaluation (Addendum)
Anesthesia Evaluation  Patient identified by MRN, date of birth, ID band Patient awake    Reviewed: Allergy & Precautions, NPO status , Patient's Chart, lab work & pertinent test results  Airway Mallampati: II  TM Distance: >3 FB Neck ROM: Full    Dental  (+) Teeth Intact, Dental Advisory Given,    Pulmonary Patient abstained from smoking., former smoker,  History of vaping LUNG CANCER   Pulmonary exam normal breath sounds clear to auscultation       Cardiovascular Exercise Tolerance: Good negative cardio ROS Normal cardiovascular exam Rhythm:Regular Rate:Normal     Neuro/Psych negative neurological ROS  negative psych ROS   GI/Hepatic negative GI ROS, Neg liver ROS,   Endo/Other  Obesity   Renal/GU negative Renal ROS     Musculoskeletal negative musculoskeletal ROS (+)   Abdominal   Peds  Hematology negative hematology ROS (+)   Anesthesia Other Findings Day of surgery medications reviewed with the patient.  Reproductive/Obstetrics                            Anesthesia Physical Anesthesia Plan  ASA: III  Anesthesia Plan: General   Post-op Pain Management:    Induction: Intravenous  PONV Risk Score and Plan: 3 and Midazolam, Dexamethasone and Ondansetron  Airway Management Planned: Double Lumen EBT  Additional Equipment: Arterial line  Intra-op Plan:   Post-operative Plan: Extubation in OR  Informed Consent: I have reviewed the patients History and Physical, chart, labs and discussed the procedure including the risks, benefits and alternatives for the proposed anesthesia with the patient or authorized representative who has indicated his/her understanding and acceptance.     Dental advisory given  Plan Discussed with: CRNA  Anesthesia Plan Comments: (2 large bore PIV)       Anesthesia Quick Evaluation

## 2020-03-28 NOTE — Anesthesia Procedure Notes (Signed)
Procedure Name: Intubation Date/Time: 03/28/2020 12:43 PM Performed by: Renato Shin, CRNA Pre-anesthesia Checklist: Patient identified, Emergency Drugs available, Suction available and Patient being monitored Patient Re-evaluated:Patient Re-evaluated prior to induction Oxygen Delivery Method: Circle system utilized Preoxygenation: Pre-oxygenation with 100% oxygen Induction Type: IV induction Ventilation: Mask ventilation without difficulty Laryngoscope Size: Mac and 4 Grade View: Grade I Tube type: Oral Endobronchial tube: Left, EBT position confirmed by fiberoptic bronchoscope and Double lumen EBT and 39 Fr Number of attempts: 1 Airway Equipment and Method: Oral airway,  Rigid stylet,  Video-laryngoscopy and Fiberoptic brochoscope Placement Confirmation: ETT inserted through vocal cords under direct vision,  positive ETCO2 and breath sounds checked- equal and bilateral Tube secured with: Tape Dental Injury: Teeth and Oropharynx as per pre-operative assessment  Comments: Placed by Joaquim Lai

## 2020-03-28 NOTE — Transfer of Care (Signed)
Immediate Anesthesia Transfer of Care Note  Patient: Jerry [name]  Procedure(s) Performed: XI ROBOTIC ASSISTED THORASCOPY-RIGHT UPPER LOBECTOMY (Right Chest) INTERCOSTAL NERVE BLOCK (Right Chest) LYMPH NODE DISSECTION (Right Chest)  Patient Location: PACU  Anesthesia Type:General  Level of Consciousness: drowsy  Airway & Oxygen Therapy: Patient Spontanous Breathing and Patient connected to face mask oxygen  Post-op Assessment: Report given to RN and Post -op Vital signs reviewed and stable  Post vital signs: Reviewed and stable  Last Vitals:  Vitals Value Taken Time  BP 157/83 03/28/20 1703  Temp    Pulse 85 03/28/20 1706  Resp 20 03/28/20 1706  SpO2 96 % 03/28/20 1706  Vitals shown include unvalidated device data.  Last Pain:  Vitals:   03/28/20 1106  TempSrc:   PainSc: 0-No pain      Patients Stated Pain Goal: 3 (74/71/85 5015)  Complications: No complications documented.

## 2020-03-28 NOTE — Interval H&P Note (Signed)
History and Physical Interval Note:  03/28/2020 12:11 PM  Jerry [name]  has presented today for surgery, with the diagnosis of LUNG CANCER.  The various methods of treatment have been discussed with the patient and family. After consideration of risks, benefits and other options for treatment, the patient has consented to  Procedure(s): XI ROBOTIC ASSISTED THORASCOPY-RIGHT UPPER LOBECTOMY (Right) as a surgical intervention.  The patient's history has been reviewed, patient examined, no change in status, stable for surgery.  I have reviewed the patient's chart and labs.  Questions were answered to the patient's satisfaction.     [name] Bary Leriche

## 2020-03-29 ENCOUNTER — Inpatient Hospital Stay (HOSPITAL_COMMUNITY): Payer: 59

## 2020-03-29 ENCOUNTER — Encounter (HOSPITAL_COMMUNITY): Payer: Self-pay | Admitting: Thoracic Surgery (Cardiothoracic Vascular Surgery)

## 2020-03-29 LAB — BASIC METABOLIC PANEL
Anion gap: 6 (ref 5–15)
BUN: 18 mg/dL (ref 6–20)
CO2: 28 mmol/L (ref 22–32)
Calcium: 9.2 mg/dL (ref 8.9–10.3)
Chloride: 103 mmol/L (ref 98–111)
Creatinine, Ser: 1.12 mg/dL (ref 0.61–1.24)
GFR, Estimated: >60 mL/min (ref >60)
Glucose, Bld: 139 mg/dL — ABNORMAL HIGH (ref 70–99)
Potassium: 4.2 mmol/L (ref 3.5–5.1)
Sodium: 137 mmol/L (ref 135–145)

## 2020-03-29 LAB — CBC
HCT: 39.6 % (ref 39.0–52.0)
Hemoglobin: 13.7 g/dL (ref 13.0–17.0)
MCH: 31.1 pg (ref 26.0–34.0)
MCHC: 34.6 g/dL (ref 30.0–36.0)
MCV: 90 fL (ref 80.0–100.0)
Platelets: 257 10*3/uL (ref 150–400)
RBC: 4.4 MIL/uL (ref 4.22–5.81)
RDW: 12.6 % (ref 11.5–15.5)
WBC: 9.1 10*3/uL (ref 4.0–10.5)
nRBC: 0 % (ref 0.0–0.2)

## 2020-03-29 MED ORDER — ALBUTEROL SULFATE (2.5 MG/3ML) 0.083% IN NEBU: 2.5000 mg | INHALATION_SOLUTION | RESPIRATORY_TRACT | Status: DC | PRN

## 2020-03-29 NOTE — Progress Notes (Addendum)
[address]       [place] [address]             [phone]      1 Day Post-Op Procedure(s) (LRB): XI ROBOTIC ASSISTED THORASCOPY-RIGHT UPPER LOBECTOMY (Right) INTERCOSTAL NERVE BLOCK (Right) LYMPH NODE DISSECTION (Right) Subjective: Pain control is reasonable   Objective: Vital signs in last 24 hours: Temp:  [97.6 F (36.4 C)-98.6 F (37 C)] 98.2 F (36.8 C) (03/22 0300) Pulse Rate:  [65-91] 65 (03/22 0300) Cardiac Rhythm: Normal sinus rhythm (03/22 0335) Resp:  [10-24] 15 (03/22 0300) BP: (118-177)/(61-93) 118/61 (03/22 0300) SpO2:  [92 %-99 %] 92 % (03/22 0300) Arterial Line BP: (168)/(78) 168/78 (03/21 1730) Weight:  [124.7 kg] 124.7 kg (03/21 1049)  Hemodynamic parameters for last 24 hours:    Intake/Output from previous day: 03/21 0701 - 03/22 0700 In: 2180 [P.O.:480; I.V.:1300; IV Piggyback:400] Out: 950 [Urine:550; Blood:150; Chest Tube:250] Intake/Output this shift: No intake/output data recorded.  General appearance: alert, cooperative and no distress Heart: regular rate and rhythm Lungs: slighly coarse on right , dim in right base Abdomen: benign Extremities: PAS in place, no calf tenderness Wound: incis healing well  Lab Results: Recent Labs    03/28/20 1331 03/29/20 0114  WBC  --  9.1  HGB 13.9 13.7  HCT 41.0 39.6  PLT  --  257   BMET:  Recent Labs    03/28/20 1331 03/29/20 0114  NA 141 137  K 4.2 4.2  CL  --  103  CO2  --  28  GLUCOSE  --  139*  BUN  --  18  CREATININE  --  1.12  CALCIUM  --  9.2    PT/INR: No results for input(s): LABPROT, INR in the last 72 hours. ABG    Component Value Date/Time   PHART 7.296 (L) 03/28/2020 1331   HCO3 26.9 03/28/2020 1331   TCO2 29 03/28/2020 1331   ACIDBASEDEF 1.0 03/28/2020 1331   O2SAT 91.0 03/28/2020 1331   CBG (last 3)  No results for input(s): GLUCAP in the last 72 hours.  Meds Scheduled Meds: . acetaminophen  1,000 mg Oral Q6H   Or  .  acetaminophen (TYLENOL) oral liquid 160 mg/5 mL  1,000 mg Oral Q6H  . bisacodyl  10 mg Oral Daily  . enoxaparin (LOVENOX) injection  40 mg Subcutaneous Daily  . ketorolac  15 mg Intravenous Q6H  . senna-docusate  1 tablet Oral QHS   Continuous Infusions: . sodium chloride     PRN Meds:.Place/Maintain arterial line **AND** sodium chloride, albuterol, ondansetron (ZOFRAN) IV, traMADol  Xrays DG CHEST PORT 1 VIEW  Result Date: 03/29/2020 CLINICAL DATA:  Chest tube, sore chest post right upper lobectomy. EXAM: PORTABLE CHEST 1 VIEW COMPARISON:  Radiograph 03/28/2020 FINDINGS: Apically directed right chest tube with multiple side ports terminates towards the medial lung apex. There is a residual right apical pneumothorax, grossly similar in size to comparison accounting for differences in technique and inflation. No layering pleural fluid. No focal consolidative process or convincing edema. Cardiomediastinal contours are unchanged from prior accounting for technical factors as well. No acute osseous or soft tissue abnormality. Telemetry leads overlie the chest. IMPRESSION: Grossly stable appearance of a right apical pneumothorax with chest tube in place. No other significant interval change. Electronically Signed   By: Lovena Le M.D.   On: 03/29/2020 06:18   DG Chest Port 1 View  Result Date: 03/28/2020 CLINICAL DATA:  Status post right upper lobectomy EXAM: PORTABLE CHEST 1 VIEW COMPARISON:  Chest x-ray 03/24/2020 FINDINGS: The heart size and mediastinal contours are unchanged. No focal consolidation. No pulmonary edema. No pleural effusion. Trace to small volume right apical pneumothorax with a right chest tube in appropriate position in the postsurgical setting status post right upper lobectomy. No acute osseous abnormality. IMPRESSION: Trace to small volume right apical pneumothorax with a right chest tube in appropriate position in the postsurgical setting status post right upper lobectomy.  These results will be called to the ordering clinician or representative by the Radiologist Assistant, and communication documented in the PACS or [employer]. Electronically Signed   By: Iven Finn M.D.   On: 03/28/2020 19:25    Assessment/Plan: S/P Procedure(s) (LRB): XI ROBOTIC ASSISTED THORASCOPY-RIGHT UPPER LOBECTOMY (Right) INTERCOSTAL NERVE BLOCK (Right) LYMPH NODE DISSECTION (Right)  1 afeb, VSS 2 sats good in RA 3 not anemic 4 normal renal fxn, good UOP 5 CT 250 cc drainage post op  6 stable right apical pntx/space 7 + air leak ,could consider placing to suction 8 lovenox for DVT ppx 9 prn albuterol, pulm toilet/IS, mobilize per routine    LOS: 1 day    John Giovanni PA-C Pager [phone] 03/29/2020   Agree with above Air leak with cough Apical pneumoT. Aggressive incentive spirometer and pulm toilet Will keep to [place] for now  [employer]

## 2020-03-29 NOTE — Hospital Course (Addendum)
History of present illness:  The patient is a 45 year old male referred by Dr. Valeta Harms for surgical evaluation of a biopsy-proven non-small cell lung cancer of the right upper lobe.  This was initially an incidental finding on a work physical evaluation.  Chest x-ray revealed the lesion.  He subsequently underwent a more formal evaluation by Dr. Jamey Ripa including PET/CT scans which revealed no nodal or distant disease.  He additionally had an MRI of the brain which was also negative for metastatic findings.  He was noted to be asymptomatic from a pulmonary perspective.  His weight has been stable.  He has had no neurological symptoms.  He denies any anginal symptoms.  He is noted to be a long-term smoker and quit in late February of this year.  The patient and all relevant studies were reviewed by Dr. Kipp Brood who agreed with recommendations and he was admitted electively for a robotic assisted thoracoscopic surgical resection.  Hospital course: The patient was admitted and on 03/28/2020 he was taken to the operating room where he underwent a robotic assisted right video thoracoscopy with right upper lobectomy and mediastinal lymph node sampling.  He tolerated the procedure well and was taken to the postanesthesia care unit in stable condition.  Postoperative hospital course:   The patient is doing well.  On postoperative day #1 it is noted that he has a air leak in the chest tube with cough.  On chest x-ray he shows a small apical pneumothorax/space.  Oxygen has been weaned maintains good saturations on room air.  He is he is noted not to have anemia.  Renal function has remained normal and he has good urine output.  He is tolerating routine pulmonary toilet has mobilization per standard protocols.  His CXR showed slight improvement of right apical pneumothorax space.  He had an intermittent air leak with cough.  His chest tube was left to water seal.

## 2020-03-29 NOTE — Plan of Care (Signed)

## 2020-03-29 NOTE — Anesthesia Postprocedure Evaluation (Signed)
Anesthesia Post Note  Patient: Jerry [name]  Procedure(s) Performed: XI ROBOTIC ASSISTED THORASCOPY-RIGHT UPPER LOBECTOMY (Right Chest) INTERCOSTAL NERVE BLOCK (Right Chest) LYMPH NODE DISSECTION (Right Chest)     Patient location during evaluation: PACU Anesthesia Type: General Level of consciousness: awake and alert Pain management: pain level controlled Vital Signs Assessment: post-procedure vital signs reviewed and stable Respiratory status: spontaneous breathing, nonlabored ventilation, respiratory function stable and patient connected to nasal cannula oxygen Cardiovascular status: blood pressure returned to baseline and stable Postop Assessment: no apparent nausea or vomiting Anesthetic complications: no   No complications documented.  Last Vitals:  Vitals:   03/29/20 0300 03/29/20 0800  BP: 118/61 117/68  Pulse: 65 71  Resp: 15 18  Temp: 36.8 C 36.8 C  SpO2: 92% 97%    Last Pain:  Vitals:   03/29/20 0800  TempSrc: Oral  PainSc:                  Catalina Gravel

## 2020-03-29 NOTE — Discharge Instructions (Signed)
[facility] office [phone]    Robot-Assisted Thoracic Surgery, Care After The following information offers guidance on how to care for yourself after your procedure. Your health care provider may also give you more specific instructions. If you have problems or questions, contact your health care provider. What can I expect after the procedure? After the procedure, it is common to have:  Some pain and aches in the area of your surgical incisions.  Pain when breathing in (inhaling) and coughing.  Tiredness (fatigue).  Trouble sleeping.  Constipation. Follow these instructions at home: Medicines  Take over-the-counter and prescription medicines only as told by your health care provider.  If you were prescribed an antibiotic medicine, take it as told by your health care provider. Do not stop taking the antibiotic even if you start to feel better.  Talk with your health care provider about safe and effective ways to manage pain after your procedure. Pain management should fit your specific health needs.  Take pain medicine before pain becomes severe. Relieving and controlling your pain will make breathing easier for you.  Ask your health care provider if the medicine prescribed to you requires you to avoid driving or using machinery. Eating and drinking Follow instructions from your health care provider about eating or drinking restrictions. These will vary depending on what procedure you had. Your health care provider may recommend:  A liquid diet or soft diet for the first few days.  Meals that are smaller and more frequent.  A diet of fruits, vegetables, whole grains, and low-fat proteins.  Limiting foods that are high in fat and processed sugar, including fried or sweet foods. Incision care  Follow instructions from your health care provider about how to take care of your incisions. Make sure you: ? Wash your hands with soap and water for at least 20 seconds before and after  you change your bandage (dressing). If soap and water are not available, use hand sanitizer. ? Change your dressing as told by your health care provider. ? Leave stitches (sutures), skin glue, or adhesive strips in place. These skin closures may need to stay in place for 2 weeks or longer. If adhesive strip edges start to loosen and curl up, you may trim the loose edges. Do not remove adhesive strips completely unless your health care provider tells you to do that.  Check your incision area every day for signs of infection. Check for: ? Redness, swelling, or more pain. ? Fluid or blood. ? Warmth. ? Pus or a bad smell. Activity  Return to your normal activities as told by your health care provider. Ask your health care provider what activities are safe for you.  Ask your health care provider when it is safe for you to drive.  Do not lift anything that is heavier than 10 lb (4.5 kg), or the limit that you are told, until your health care provider says that it is safe.  Rest as told by your health care provider.  Avoid sitting for a long time without moving. Get up to take short walks every 1-2 hours. This is important to improve blood flow and breathing. Ask for help if you feel weak or unsteady.  Do exercises as told by your health care provider. Pneumonia prevention  Do deep breathing exercises and cough regularly as directed. This helps clear mucus and opens your lungs. Doing this helps prevent lung infection (pneumonia).  If you were given an incentive spirometer, use it as told. An incentive  spirometer is a tool that measures how well you are filling your lungs with each breath.  Coughing may hurt less if you try to support your chest. This is called splinting. Try one of these when you cough: ? Hold a pillow against your chest. ? Place the palms of both hands on top of your incision area.  Do not use any products that contain nicotine or tobacco. These products include cigarettes,  chewing tobacco, and vaping devices, such as e-cigarettes. If you need help quitting, ask your health care provider.  Avoid secondhand smoke.   General instructions  If you have a drainage tube: ? Follow instructions from your health care provider about how to take care of it. ? Do not travel by airplane after your tube is removed until your health care provider tells you it is safe.  You may need to take these actions to prevent or treat constipation: ? Drink enough fluid to keep your urine pale yellow. ? Take over-the-counter or prescription medicines. ? Eat foods that are high in fiber, such as beans, whole grains, and fresh fruits and vegetables. ? Limit foods that are high in fat and processed sugars, such as fried or sweet foods.  Keep all follow-up visits. This is important. Contact a health care provider if:  You have redness, swelling, or more pain around an incision.  You have fluid or blood coming from an incision.  An incision feels warm to the touch.  You have pus or a bad smell coming from an incision.  You have a fever.  You cannot eat or drink without vomiting.  Your pain medicine is not controlling your pain. Get help right away if:  You have chest pain.  Your heart is beating quickly.  You have trouble breathing.  You have trouble speaking.  You are confused.  You feel weak or dizzy, or you faint. These symptoms may represent a serious problem that is an emergency. Do not wait to see if the symptoms will go away. Get medical help right away. Call your local emergency services (911 in the [place] Do not drive yourself to the hospital. Summary  Talk with your health care provider about safe and effective ways to manage pain after your procedure. Pain management should fit your specific health needs.  Return to your normal activities as told by your health care provider. Ask your health care provider what activities are safe for you.  Do deep breathing  exercises and cough regularly as directed. This helps to clear mucus and prevent pneumonia. If it hurts to cough, ease pain by holding a pillow against your chest or by placing the palms of both hands over your incisions. This information is not intended to replace advice given to you by your health care provider. Make sure you discuss any questions you have with your health care provider. Document Revised: 09/18/2019 Document Reviewed: 09/18/2019 Elsevier Patient Education  2021 [employer].

## 2020-03-29 NOTE — Discharge Summary (Signed)
Physician Discharge Summary  Patient ID: Jerry [name] MRN: [id number] DOB/AGE: 1975-06-01 45 y.o.  Admit date: 03/28/2020 Discharge date: 03/31/2020 Referring: Garner Nash, DO Primary Care: [name], NP Primary Cardiologist: No primary care provider on file.  Admission Diagnoses:  Discharge Diagnoses:  Active Problems:   S/P lobectomy of lung   Patient Active Problem List   Diagnosis Date Noted  . S/P lobectomy of lung 03/28/2020  . Adenocarcinoma of right lung, stage 2 ([facility]) 03/14/2020  . Mass of upper lobe of right lung 02/29/2020  . Tobacco abuse 02/29/2020     History of Present Illness:    Jerry [name] 45 y.o. male referred by Dr. Valeta Harms for surgical evaluation of a biopsy-proven non-small cell lung cancer of the right upper lobe.  This was originally found incidentally on a work physical.  The chest x-ray noted to this lesion.  He subsequently underwent formal evaluation by Dr. Valeta Harms.  He has also undergone an MRI brain which has been negative as well as a PET/CT which shows no nodal or distant disease.  From a symptomatic standpoint he denies any respiratory problems.  His weight has been stable.  He denies any neurologic symptoms.  He also denies any anginal symptoms.  The patient and all relevant studies were reviewed by Dr. Kipp Brood who felt he was a candidate to proceed with elective resection.  He was admitted this hospitalization for the procedure.   PATHOLOGY:   Clinical History: Lung cancer (nt)    FINAL MICROSCOPIC DIAGNOSIS:   A. LUNG, RIGHT UPPER, LOBECTOMY:  - Invasive adenocarcinoma, 4.6 cm  - Margins uninvolved by adenocarcinoma  - See oncology table and comment below   B. LYMPH NODE, LEVEL 9, BIOPSY:  - No nodal tissue identified   C. LYMPH NODE, LEVEL 10, BIOPSY:  - No carcinoma identified in one lymph node (0/1)   D. LYMPH NODE, HILAR #1, BIOPSY:  - No carcinoma identified in one lymph node (0/1)   E. LYMPH NODE, HILAR  #2, BIOPSY:  - No carcinoma identified in one lymph node (0/1)   F. LYMPH NODE, HILAR #3, BIOPSY:  - No carcinoma identified in one lymph node (0/1)   G. LYMPH NODE, HILAR #4, BIOPSY:  - No carcinoma identified in one lymph node (0/1)   ONCOLOGY TABLE:   LUNG: Resection   Synchronous Tumors: N/A  Total Number of Primary Tumors: 1  Procedure: Lobectomy  Specimen Laterality: Right  Tumor Focality: Unifocal  Tumor Site: Upper lobe of lung  Tumor Size: 4.6 cm  Histologic type: Invasive mucinous adenocarcinoma  Histologic components present:    Acinar: 70%    Papillary: 20%    Micropapillary: 10%  Visceral Pleura Invasion: Not identified; see comment  Direct Invasion of Adjacent Structures: No adjacent structures present  Lymphovascular Invasion: Not identified  Margins: All margins negative for invasive carcinoma    Closest Margin(s) to Invasive Carcinoma: Bronchial margin (1.8 cm)  Treatment Effect: No known presurgical therapy  Regional Lymph Nodes:    Number of Lymph Nodes Involved: 0               Nodal Sites with Tumor: N/A  Number of Lymph Nodes Examined: 5            Nodal Sites Examined: Level 10, hilar  Distant Metastasis:    Distant Site(s) Involved: N/A  Pathologic Stage Classification (pTNM, AJCC 8th Edition): pT2b, pN0  Ancillary Studies: Can be performed upon request  Representative Tumor Block: A2  Comment(s): Orcein stain highlights an intact elastic layer, supporting  the absence of pleural invasion. Cytokeratin AE1/3 highlights tumor in  relation to the pleura. Dr. Vic Ripper reviewed the case and agrees with  the above diagnosis.  (v4.2.0.1)     GROSS DESCRIPTION:   Specimen A: Lung, right upper lobectomy, received fresh.  Specimen integrity: Disrupted over the posterior segment.  Size, weight: 227 g, 14 x 10 x 5.6 cm.  Pleura: Pink red-purple with moderate anthracosis. There are scattered  areas of  indurated, thickened, and vaguely nodular pleura.  Lesion: At the disrupted area is a 4.6 x 3.8 x 3.1 cm tan-white  anthracotic firm ill-defined mass. The mass abuts the pleura adjacent  to the disrupted area, with possible involvement of pleura.  Margin(s): The mass is 1.8 cm from the bronchial margin.  Hilar vessels: Patent, unremarkable.  Nonneoplastic parenchyma: Pink-red to dark red, spongy.  Lymph nodes: At the hilum is a possible 0.6 cm anthracotic node.  Block Summary:  Block 1 = mass  Blocks 2-4 = mass and possible involvement of pleura  Block 5 = mass and adjacent parenchyma  Block 6 = sections of thickened, indurated and vaguely nodular pleura  Block 7 = bronchial margin  Block 8 = vascular margins  Block 9 = 1 possible node   Specimen B: Received fresh labeled level 9 node is a 0.6 x 0.6 x 0.25 cm  yellow-pink soft tissue, in toto 1 block.   Specimen C: Received fresh labeled level 10 node is a 1 cm soft  anthracotic nodule which is bisected and submitted in 1 block.   Specimen D: Received fresh labeled hilar #1 is a 1.2 x 0.8 x 0.3 cm  aggregate of yellow to anthracotic soft to rubbery tissue, entirely  submitted in 1 block.   Specimen E: Received fresh labeled hilar #2 is a 1.3 x 0.6 x 0.4 cm soft  yellow-pink to anthracotic soft tissue, bisected and submitted in 1  block.   Specimen F: Received fresh labeled hilar #3 is a 1.5 x 1 x 0.3 cm  yellow-pink to anthracotic soft tissue, in toto 1 block.   Specimen G: Received fresh labeled hilar #4 is a 1 x 0.5 x 0.3 cm  aggregate of yellow-pink to anthracotic soft tissue, submitted 1 block.   SW 03/29/2020     Final Diagnosis performed by Thressa Sheller, MD.  Electronically signed  03/31/2020  Discharged Condition: good  Hospital Course:  Hospital course: The patient was admitted and on 03/28/2020 he was taken to the operating room where he underwent a robotic assisted right video thoracoscopy with right upper  lobectomy and mediastinal lymph node sampling.  He tolerated the procedure well and was taken to the postanesthesia care unit in stable condition.  Postoperative hospital course:   The patient is doing well.  On postoperative day #1 it is noted that he has a air leak in the chest tube with cough.  On chest x-ray he shows a small apical pneumothorax/space.  Oxygen has been weaned maintains good saturations on room air.  He is he is noted not to have anemia.  Renal function has remained normal and he has good urine output.  He is tolerating routine pulmonary toilet has mobilization per standard protocols.  His CXR showed slight improvement of right apical pneumothorax space.  He had an intermittent air leak with cough.  His chest tube was left to water seal.  On postoperative day 3 the chest tube was removed  and follow-up x-ray showed no significant changes in x-ray appearance.  Clinically has shown excellent ongoing progress tolerating routine rehab.  Incisions are healing well without evidence of infection.  His pain is under adequate control.  Full pathology report is listed above.  At the time of discharge he is felt to be stable  Consults: None  Significant Diagnostic Studies: routine post op labs and serial  Treatments: surgery:  03/28/2020  Patient:  Jerry [name] Pre-Op Dx: Jerry [name] NSCLC of the right upper lobe   Post-op Dx:  none Procedure: - Robotic assisted right video thoracoscopy - right upper lobectomy - Mediastinal lymph node sampling - Intercostal nerve block  Surgeon and Role:      * Lightfoot, Lucile Crater, MD - Primary    * [place] Assistant: Leretha Pol, PA-C  Anesthesia  general EBL:  350 ml   Discharge Exam: Blood pressure 114/72, pulse 73, temperature 98.1 F (36.7 C), temperature source Oral, resp. rate (!) 21, height 5\' 11"  (1.803 m), weight 124.7 kg, SpO2 96 %.   General appearance: alert, cooperative and no distress Heart: regular rate and  rhythm Lungs: clear to auscultation bilaterally Abdomen: benign Extremities: no edema or calf tenderness Wound: incis healing well Disposition: Discharge disposition: 01-Home or Self Care       Discharge Instructions    Discharge patient   Complete by: As directed    Discharge disposition: 01-Home or Self Care   Discharge patient date: 03/31/2020     Allergies as of 03/31/2020   No Known Allergies     Medication List    TAKE these medications   nicotine polacrilex 4 MG gum Commonly known as: NICORETTE Take 2 mg by mouth as needed for smoking cessation.   traMADol 50 MG tablet Commonly known as: ULTRAM Take 1 tablet (50 mg total) by mouth every 6 (six) hours as needed (mild pain).       Follow-up Information    Lightfoot, Lucile Crater, MD Follow up.   Specialty: Cardiothoracic Surgery Why: Please see discharge paperwork for follow-up appointment with Dr. Kipp Brood.  Appointments can also be seen by signing up for [facility] information: [address] [place] [address] [phone]               Signed: John Giovanni PA-C 03/31/2020, 11:51 AM

## 2020-03-30 ENCOUNTER — Inpatient Hospital Stay (HOSPITAL_COMMUNITY): Payer: 59

## 2020-03-30 HISTORY — PX: LUNG LOBECTOMY: SHX167

## 2020-03-30 LAB — COMPREHENSIVE METABOLIC PANEL
ALT: 16 U/L (ref 0–44)
AST: 19 U/L (ref 15–41)
Albumin: 3.2 g/dL — ABNORMAL LOW (ref 3.5–5.0)
Alkaline Phosphatase: 36 U/L — ABNORMAL LOW (ref 38–126)
Anion gap: 6 (ref 5–15)
BUN: 22 mg/dL — ABNORMAL HIGH (ref 6–20)
CO2: 26 mmol/L (ref 22–32)
Calcium: 8.4 mg/dL — ABNORMAL LOW (ref 8.9–10.3)
Chloride: 106 mmol/L (ref 98–111)
Creatinine, Ser: 1.04 mg/dL (ref 0.61–1.24)
GFR, Estimated: >60 mL/min (ref >60)
Glucose, Bld: 108 mg/dL — ABNORMAL HIGH (ref 70–99)
Potassium: 3.8 mmol/L (ref 3.5–5.1)
Sodium: 138 mmol/L (ref 135–145)
Total Bilirubin: 0.7 mg/dL (ref 0.3–1.2)
Total Protein: 5.8 g/dL — ABNORMAL LOW (ref 6.5–8.1)

## 2020-03-30 LAB — CBC
HCT: 37.5 % — ABNORMAL LOW (ref 39.0–52.0)
Hemoglobin: 12.4 g/dL — ABNORMAL LOW (ref 13.0–17.0)
MCH: 30.6 pg (ref 26.0–34.0)
MCHC: 33.1 g/dL (ref 30.0–36.0)
MCV: 92.6 fL (ref 80.0–100.0)
Platelets: 193 10*3/uL (ref 150–400)
RBC: 4.05 MIL/uL — ABNORMAL LOW (ref 4.22–5.81)
RDW: 12.7 % (ref 11.5–15.5)
WBC: 7.5 10*3/uL (ref 4.0–10.5)
nRBC: 0 % (ref 0.0–0.2)

## 2020-03-30 MED ORDER — KETOROLAC TROMETHAMINE 15 MG/ML IJ SOLN
15.0000 mg | Freq: Four times a day (QID) | INTRAMUSCULAR | Status: AC
  Administered 2020-03-30 – 2020-03-31 (×4): 15 mg via INTRAVENOUS
  Filled 2020-03-30 (×4): qty 1

## 2020-03-30 NOTE — Progress Notes (Addendum)
      [address]       [place] [address]             [phone]      2 Days Post-Op Procedure(s) (LRB): XI ROBOTIC ASSISTED THORASCOPY-RIGHT UPPER LOBECTOMY (Right) INTERCOSTAL NERVE BLOCK (Right) LYMPH NODE DISSECTION (Right)   Subjective:  No new complaints.  States he is using his IS.  He has almost gotten the bar to the top.  He has moved his bowels.  Objective: Vital signs in last 24 hours: Temp:  [97.9 F (36.6 C)-98.6 F (37 C)] 97.9 F (36.6 C) (03/23 0400) Pulse Rate:  [53-81] 53 (03/23 0400) Cardiac Rhythm: Sinus bradycardia (03/23 0700) Resp:  [13-19] 13 (03/23 0400) BP: (104-134)/(61-71) 104/71 (03/23 0400) SpO2:  [94 %-97 %] 94 % (03/23 0400)  Intake/Output from previous day: 03/22 0701 - 03/23 0700 In: 240 [P.O.:240] Out: 820 [Urine:550; Chest Tube:270]  General appearance: alert, cooperative and no distress Heart: regular rate and rhythm Lungs: clear to auscultation bilaterally Abdomen: soft, non-tender; bowel sounds normal; no masses,  no organomegaly Extremities: extremities normal, atraumatic, no cyanosis or edema Wound: clean and dry  Lab Results: Recent Labs    03/29/20 0114 03/30/20 0052  WBC 9.1 7.5  HGB 13.7 12.4*  HCT 39.6 37.5*  PLT 257 193   BMET:  Recent Labs    03/29/20 0114 03/30/20 0052  NA 137 138  K 4.2 3.8  CL 103 106  CO2 28 26  GLUCOSE 139* 108*  BUN 18 22*  CREATININE 1.12 1.04  CALCIUM 9.2 8.4*    PT/INR: No results for input(s): LABPROT, INR in the last 72 hours. ABG    Component Value Date/Time   PHART 7.296 (L) 03/28/2020 1331   HCO3 26.9 03/28/2020 1331   TCO2 29 03/28/2020 1331   ACIDBASEDEF 1.0 03/28/2020 1331   O2SAT 91.0 03/28/2020 1331   CBG (last 3)  No results for input(s): GLUCAP in the last 72 hours.  Assessment/Plan: S/P Procedure(s) (LRB): XI ROBOTIC ASSISTED THORASCOPY-RIGHT UPPER LOBECTOMY (Right) INTERCOSTAL NERVE BLOCK (Right) LYMPH NODE DISSECTION  (Right)  1. CV- Sinus Bradycardia, BP stable 2. Pulm- improvement right apical space/pneumothorax- CT on water seal, air leak with first cough, on following subsequent cough there was no air leak present, output 270 cc 3. Lovenox for DVT prophylaxis 4. Dispo- patient stable, initial air leak with cough, resolved on subsequent cough, apical space/pneumothorax looks improved, will discuss chest tube management with Dr. Kipp Brood... leave chest tube to water seal, possibly clamp chest tube with repeat CXR later today   LOS: 2 days    Ellwood Handler, PA-C  03/30/2020   Agree with above Doing well Will keep CT to suction  Kartik Fernando O Samika Vetsch

## 2020-03-30 NOTE — Plan of Care (Signed)
°  Problem: Education: Goal: Knowledge of General Education information will improve Description: Including pain rating scale, medication(s)/side effects and non-pharmacologic comfort measures Outcome: Progressing   Problem: Health Behavior/Discharge Planning: Goal: Ability to manage health-related needs will improve Outcome: Progressing   Problem: Clinical Measurements: Goal: Ability to maintain clinical measurements within normal limits will improve Outcome: Progressing Goal: Will remain free from infection Outcome: Progressing Goal: Diagnostic test results will improve Outcome: Progressing Goal: Respiratory complications will improve Outcome: Progressing Goal: Cardiovascular complication will be avoided Outcome: Progressing   Problem: Activity: Goal: Risk for activity intolerance will decrease Outcome: Progressing   Problem: Nutrition: Goal: Adequate nutrition will be maintained Outcome: Progressing   Problem: Coping: Goal: Level of anxiety will decrease Outcome: Progressing   Problem: Elimination: Goal: Will not experience complications related to bowel motility Outcome: Progressing Goal: Will not experience complications related to urinary retention Outcome: Progressing   Problem: Pain Managment: Goal: General experience of comfort will improve Outcome: Progressing   Problem: Safety: Goal: Ability to remain free from injury will improve Outcome: Progressing   Problem: Skin Integrity: Goal: Risk for impaired skin integrity will decrease Outcome: Progressing   Problem: Education: Goal: Knowledge of disease or condition will improve Outcome: Progressing Goal: Knowledge of the prescribed therapeutic regimen will improve Outcome: Progressing   Problem: Activity: Goal: Risk for activity intolerance will decrease Outcome: Progressing   Problem: Cardiac: Goal: Will achieve and/or maintain hemodynamic stability Outcome: Progressing   Problem: Clinical  Measurements: Goal: Postoperative complications will be avoided or minimized Outcome: Progressing   Problem: Respiratory: Goal: Respiratory status will improve Outcome: Progressing   Problem: Pain Management: Goal: Pain level will decrease Outcome: Progressing   Problem: Skin Integrity: Goal: Wound healing without signs and symptoms infection will improve Outcome: Progressing

## 2020-03-30 NOTE — Plan of Care (Signed)

## 2020-03-31 ENCOUNTER — Inpatient Hospital Stay (HOSPITAL_COMMUNITY): Payer: 59

## 2020-03-31 ENCOUNTER — Telehealth: Payer: Self-pay

## 2020-03-31 LAB — SURGICAL PATHOLOGY

## 2020-03-31 MED ORDER — TRAMADOL HCL 50 MG PO TABS: 50.0000 mg | ORAL_TABLET | Freq: Four times a day (QID) | ORAL | 0 refills | Status: DC | PRN

## 2020-03-31 NOTE — Plan of Care (Signed)

## 2020-03-31 NOTE — Progress Notes (Signed)
Chest tube removed per orders, patient tolerated well, site is clean dry and intact. Radiology informed of removal for x-ray.

## 2020-03-31 NOTE — Telephone Encounter (Signed)
FMLA form completed and faxed to [employer] Beginning leave 03/28/20 through 06/27/20 Original form mail to home address.

## 2020-03-31 NOTE — Progress Notes (Addendum)
[address]       [place] [address]             [phone]      3 Days Post-Op Procedure(s) (LRB): XI ROBOTIC ASSISTED THORASCOPY-RIGHT UPPER LOBECTOMY (Right) INTERCOSTAL NERVE BLOCK (Right) LYMPH NODE DISSECTION (Right) Subjective: Feels pretty well, no specific c/o  Objective: Vital signs in last 24 hours: Temp:  [98 F (36.7 C)-98.7 F (37.1 C)] 98.2 F (36.8 C) (03/24 0409) Pulse Rate:  [55-72] 69 (03/24 0409) Cardiac Rhythm: Sinus bradycardia (03/24 0700) Resp:  [16-20] 16 (03/24 0409) BP: (105-128)/(62-87) 114/72 (03/24 0409) SpO2:  [94 %-99 %] 95 % (03/24 0409)  Hemodynamic parameters for last 24 hours:    Intake/Output from previous day: 03/23 0701 - 03/24 0700 In: 120 [P.O.:120] Out: 8850 [Urine:1575; Chest Tube:210] Intake/Output this shift: No intake/output data recorded.  General appearance: alert, cooperative and no distress Heart: regular rate and rhythm Lungs: clear to auscultation bilaterally Abdomen: benign Extremities: no edema or calf tenderness Wound: incis healing well  Lab Results: Recent Labs    03/29/20 0114 03/30/20 0052  WBC 9.1 7.5  HGB 13.7 12.4*  HCT 39.6 37.5*  PLT 257 193   BMET:  Recent Labs    03/29/20 0114 03/30/20 0052  NA 137 138  K 4.2 3.8  CL 103 106  CO2 28 26  GLUCOSE 139* 108*  BUN 18 22*  CREATININE 1.12 1.04  CALCIUM 9.2 8.4*    PT/INR: No results for input(s): LABPROT, INR in the last 72 hours. ABG    Component Value Date/Time   PHART 7.296 (L) 03/28/2020 1331   HCO3 26.9 03/28/2020 1331   TCO2 29 03/28/2020 1331   ACIDBASEDEF 1.0 03/28/2020 1331   O2SAT 91.0 03/28/2020 1331   CBG (last 3)  No results for input(s): GLUCAP in the last 72 hours.  Meds Scheduled Meds: . acetaminophen  1,000 mg Oral Q6H   Or  . acetaminophen (TYLENOL) oral liquid 160 mg/5 mL  1,000 mg Oral Q6H  . bisacodyl  10 mg Oral Daily  . enoxaparin (LOVENOX) injection  40 mg Subcutaneous  Daily  . ketorolac  15 mg Intravenous Q6H  . senna-docusate  1 tablet Oral QHS   Continuous Infusions: . sodium chloride     PRN Meds:.Place/Maintain arterial line **AND** sodium chloride, albuterol, ondansetron (ZOFRAN) IV, traMADol  Xrays DG CHEST PORT 1 VIEW  Result Date: 03/31/2020 CLINICAL DATA:  Chest tube.  Status post lobectomy. EXAM: PORTABLE CHEST 1 VIEW COMPARISON:  03/30/2020. FINDINGS: Patient rotated to the right. Prior right upper lobectomy. Right chest tube in stable position. Small right apical pneumothorax again noted without interim change. Heart size stable. IMPRESSION: Prior right upper lobectomy. Right chest tube in stable position. Right apical pneumothorax again noted without interval change. Electronically Signed   By: Marcello Moores  Register   On: 03/31/2020 07:18   DG CHEST PORT 1 VIEW  Result Date: 03/30/2020 CLINICAL DATA:  Chest 2, prior lung surgery EXAM: PORTABLE CHEST 1 VIEW COMPARISON:  Radiograph 03/29/2020, and CT 03/01/2020 FINDINGS: Right apically directed chest tube remains in place with some interval repositioning. Small residual right apical pneumothorax remains. No left pneumothorax. No layering effusion. Some atelectatic changes are fairly similar to prior. No new focal consolidative process or convincing features of edema at this time. Stable cardiomediastinal contours. No acute osseous or soft tissue abnormality. IMPRESSION: Interval repositioning of a right apically directed chest tube with small residual right apical  pneumothorax. Atelectasis and low volumes. Electronically Signed   By: Lovena Le M.D.   On: 03/30/2020 06:44    Assessment/Plan: S/P Procedure(s) (LRB): XI ROBOTIC ASSISTED THORASCOPY-RIGHT UPPER LOBECTOMY (Right) INTERCOSTAL NERVE BLOCK (Right) LYMPH NODE DISSECTION (Right)  1 afeb, VSS, sats good on RA 2 CXR is stable 3 CT 210 cc/24 h, serosanguinous,  tiny amt of bubbles in one chamber with initial cough, did not recur with  subsequent coughs, may be able to clamping trial soonhave  4 no new labs 5 BS well controlled 6 cont pulm toilet and rehab modalities 7 lovenox for DVT ppx   LOS: 3 days    John Giovanni PA-C Pager [phone] 03/31/2020

## 2020-03-31 NOTE — Progress Notes (Signed)
Discharge orders given to patient, wife at bedside. Patient understands all follow up appointments. 2 IV's removed without complication, site is clean dry and intact.

## 2020-04-01 ENCOUNTER — Telehealth: Payer: Self-pay | Admitting: *Deleted

## 2020-04-01 NOTE — Telephone Encounter (Signed)
Returned patient's call from answering service in regards to mixing medications. Per patient, he spoke with on call provider last night regarding taking Tylenol and Ibuprofen. Patient instructed from on call provider that it is okay to do so. No further questions at this time.

## 2020-04-04 ENCOUNTER — Telehealth: Payer: Self-pay | Admitting: *Deleted

## 2020-04-04 ENCOUNTER — Encounter (HOSPITAL_COMMUNITY): Payer: Self-pay | Admitting: Thoracic Surgery (Cardiothoracic Vascular Surgery)

## 2020-04-04 NOTE — Telephone Encounter (Signed)
Mr. Halt called with c/o pain that radiates from him "belly button to right side of abdomen". Patient is s/p RATs, lobectomy 3/21 by Dr. Kipp Brood. Per patient and his wife a lump/swelling developed near one of patients incisions. Per patient, this area is tender to touch and feels firmer than other areas of his body. Patient's wife sent photos to RN office phone for evaluation. Lump was difficult to visualize, however there was no bruising or focal discoloration at site being described. This area is several inches from any incision and appears to be more in his abdominal area. Photos and symptoms discussed with Evonnie Pat, PA. Patient advised to continue to monitor and take pain medication as prescribed and to call us back if it continues to grow, he develops a fever, or if he begins experience nausea/vomiting. Patient's wife verbalizes understanding. No further questions at this time.

## 2020-04-08 ENCOUNTER — Encounter: Payer: Self-pay | Admitting: Thoracic Surgery (Cardiothoracic Vascular Surgery)

## 2020-04-08 ENCOUNTER — Other Ambulatory Visit: Payer: Self-pay

## 2020-04-08 ENCOUNTER — Ambulatory Visit (INDEPENDENT_AMBULATORY_CARE_PROVIDER_SITE_OTHER): Payer: Self-pay | Admitting: Thoracic Surgery (Cardiothoracic Vascular Surgery)

## 2020-04-08 VITALS — BP 127/83 | HR 56 | Resp 20 | Ht 71.0 in | Wt 281.0 lb

## 2020-04-08 DIAGNOSIS — Z902 Acquired absence of lung [part of]: Secondary | ICD-10-CM

## 2020-04-08 NOTE — Progress Notes (Signed)
      [address]       [place] [address]             [phone]        Curlee Wisher [facility] Medical Record #[id number] Date of Birth: 12/03/1975  Referring: Garner Nash, DO Primary Care: [name], NP Primary Cardiologist:No primary care provider on file.  Reason for visit:   follow-up  History of Present Illness:     [name] presents for his 1 week follow-up appointment.  He has no complaints today.  He has noticed some itching along the incisions.  He denies any shortness of breath and has been ambulating well with his wife.  Physical Exam: BP 127/83 (BP Location: Left Arm, Patient Position: Sitting)   Pulse (!) 56   Resp 20   Ht 5\' 11"  (1.803 m)   Wt 281 lb (127.5 kg)   SpO2 97% Comment: RA  BMI 39.19 kg/m   Alert NAD Incision clean.   Abdomen soft, ND No peripheral edema   Diagnostic Studies & Laboratory data:  Path: FINAL MICROSCOPIC DIAGNOSIS:   A. LUNG, RIGHT UPPER, LOBECTOMY:  - Invasive adenocarcinoma, 4.6 cm  - Margins uninvolved by adenocarcinoma  - See oncology table and comment below   B. LYMPH NODE, LEVEL 9, BIOPSY:  - No nodal tissue identified   C. LYMPH NODE, LEVEL 10, BIOPSY:  - No carcinoma identified in one lymph node (0/1)   D. LYMPH NODE, HILAR #1, BIOPSY:  - No carcinoma identified in one lymph node (0/1)   E. LYMPH NODE, HILAR #2, BIOPSY:  - No carcinoma identified in one lymph node (0/1)   F. LYMPH NODE, HILAR #3, BIOPSY:  - No carcinoma identified in one lymph node (0/1)   G. LYMPH NODE, HILAR #4, BIOPSY:  - No carcinoma identified in one lymph node (0/1)   ONCOLOGY TABLE:   LUNG: Resection   Synchronous Tumors: N/A  Total Number of Primary Tumors: 1  Procedure: Lobectomy  Specimen Laterality: Right  Tumor Focality: Unifocal  Tumor Site: Upper lobe of lung  Tumor Size: 4.6 cm  Histologic type: Invasive mucinous adenocarcinoma  Histologic components present:     Acinar: 70%    Papillary: 20%    Micropapillary: 10%  Visceral Pleura Invasion: Not identified; see comment  Direct Invasion of Adjacent Structures: No adjacent structures present  Lymphovascular Invasion: Not identified  Margins: All margins negative for invasive carcinoma    Closest Margin(s) to Invasive Carcinoma: Bronchial margin (1.8 cm)  Treatment Effect: No known presurgical therapy  Regional Lymph Nodes:    Number of Lymph Nodes Involved: 0               Nodal Sites with Tumor: N/A  Number of Lymph Nodes Examined: 5            Nodal Sites Examined: Level 10, hilar  Distant Metastasis:    Distant Site(s) Involved: N/A  Pathologic Stage Classification (pTNM, AJCC 8th Edition): pT2b, pN    Assessment / Plan:   45 year old male status post right robotic assisted right upper lobectomy for a T2b N0 M0 stage II adenocarcinoma the lung.  His case will be discussed in our tumor board.  He likely will require adjuvant chemotherapy.  Referral will be made to medical oncology.    [name] 04/08/2020 4:11 PM

## 2020-04-11 ENCOUNTER — Ambulatory Visit: Payer: 59 | Admitting: Pulmonary Disease

## 2020-04-12 ENCOUNTER — Telehealth: Payer: Self-pay | Admitting: Medical Oncology

## 2020-04-12 NOTE — Telephone Encounter (Signed)
Usually 6 to 8 weeks after his surgery but he will need a follow-up appointment with me in around 2 weeks for discussion of this option.  Thank you.

## 2020-04-12 NOTE — Telephone Encounter (Signed)
Treatment Date-When is his treatment supposed to start? No appts at this time.

## 2020-04-14 ENCOUNTER — Telehealth: Payer: Self-pay | Admitting: Medical Oncology

## 2020-04-14 NOTE — Telephone Encounter (Signed)
Per Dr Julien Nordmann -this message left on pt VM re: starting chemo. "Usually 6 to 8 weeks after his surgery but he will need a follow-up appointment with me in around 2 weeks for discussion of this option."

## 2020-04-19 ENCOUNTER — Telehealth: Payer: Self-pay | Admitting: Internal Medicine

## 2020-04-19 NOTE — Telephone Encounter (Signed)
Scheduled appt per 4/7 sch msg. Pt aware.  

## 2020-04-25 ENCOUNTER — Encounter (HOSPITAL_COMMUNITY): Payer: Self-pay | Admitting: Internal Medicine

## 2020-04-26 ENCOUNTER — Encounter (HOSPITAL_COMMUNITY): Payer: Self-pay

## 2020-04-28 ENCOUNTER — Other Ambulatory Visit: Payer: Self-pay

## 2020-04-28 ENCOUNTER — Inpatient Hospital Stay: Payer: 59 | Attending: Internal Medicine

## 2020-04-28 ENCOUNTER — Inpatient Hospital Stay: Payer: 59 | Admitting: Internal Medicine

## 2020-04-28 ENCOUNTER — Encounter: Payer: Self-pay | Admitting: Internal Medicine

## 2020-04-28 ENCOUNTER — Other Ambulatory Visit: Payer: Self-pay | Admitting: Medical Oncology

## 2020-04-28 VITALS — BP 116/96 | HR 64 | Temp 97.8°F | Resp 20 | Ht 71.0 in | Wt 289.4 lb

## 2020-04-28 DIAGNOSIS — C3411 Malignant neoplasm of upper lobe, right bronchus or lung: Secondary | ICD-10-CM | POA: Insufficient documentation

## 2020-04-28 DIAGNOSIS — Z5111 Encounter for antineoplastic chemotherapy: Secondary | ICD-10-CM | POA: Insufficient documentation

## 2020-04-28 DIAGNOSIS — C3491 Malignant neoplasm of unspecified part of right bronchus or lung: Secondary | ICD-10-CM

## 2020-04-28 DIAGNOSIS — Z87891 Personal history of nicotine dependence: Secondary | ICD-10-CM | POA: Diagnosis not present

## 2020-04-28 LAB — CBC WITH DIFFERENTIAL (CANCER CENTER ONLY)
Abs Immature Granulocytes: 0.04 10*3/uL (ref 0.00–0.07)
Basophils Absolute: 0.1 10*3/uL (ref 0.0–0.1)
Basophils Relative: 1 %
Eosinophils Absolute: 0.2 10*3/uL (ref 0.0–0.5)
Eosinophils Relative: 3 %
HCT: 43.2 % (ref 39.0–52.0)
Hemoglobin: 14.6 g/dL (ref 13.0–17.0)
Immature Granulocytes: 1 %
Lymphocytes Relative: 27 %
Lymphs Abs: 1.2 10*3/uL (ref 0.7–4.0)
MCH: 30.5 pg (ref 26.0–34.0)
MCHC: 33.8 g/dL (ref 30.0–36.0)
MCV: 90.4 fL (ref 80.0–100.0)
Monocytes Absolute: 0.6 10*3/uL (ref 0.1–1.0)
Monocytes Relative: 13 %
Neutro Abs: 2.5 10*3/uL (ref 1.7–7.7)
Neutrophils Relative %: 55 %
Platelet Count: 224 10*3/uL (ref 150–400)
RBC: 4.78 MIL/uL (ref 4.22–5.81)
RDW: 12.9 % (ref 11.5–15.5)
WBC Count: 4.5 10*3/uL (ref 4.0–10.5)
nRBC: 0 % (ref 0.0–0.2)

## 2020-04-28 LAB — CMP (CANCER CENTER ONLY)
ALT: 57 U/L — ABNORMAL HIGH (ref 0–44)
AST: 41 U/L (ref 15–41)
Albumin: 4.5 g/dL (ref 3.5–5.0)
Alkaline Phosphatase: 58 U/L (ref 38–126)
Anion gap: 11 (ref 5–15)
BUN: 20 mg/dL (ref 6–20)
CO2: 29 mmol/L (ref 22–32)
Calcium: 9.8 mg/dL (ref 8.9–10.3)
Chloride: 104 mmol/L (ref 98–111)
Creatinine: 0.89 mg/dL (ref 0.61–1.24)
GFR, Estimated: >60 mL/min (ref >60)
Glucose, Bld: 100 mg/dL — ABNORMAL HIGH (ref 70–99)
Potassium: 4.7 mmol/L (ref 3.5–5.1)
Sodium: 144 mmol/L (ref 135–145)
Total Bilirubin: 0.4 mg/dL (ref 0.3–1.2)
Total Protein: 7.5 g/dL (ref 6.5–8.1)

## 2020-04-28 MED ORDER — CYANOCOBALAMIN 1000 MCG/ML IJ SOLN
1000.0000 ug | Freq: Once | INTRAMUSCULAR | Status: AC
Start: 2020-04-28 — End: 2020-04-28
  Administered 2020-04-28: 1000 ug via INTRAMUSCULAR

## 2020-04-28 MED ORDER — PROCHLORPERAZINE MALEATE 10 MG PO TABS: 10.0000 mg | ORAL_TABLET | Freq: Four times a day (QID) | ORAL | 0 refills | Status: DC | PRN

## 2020-04-28 MED ORDER — CYANOCOBALAMIN 1000 MCG/ML IJ SOLN
INTRAMUSCULAR | Status: AC
  Filled 2020-04-28: qty 1

## 2020-04-28 MED ORDER — DEXAMETHASONE 4 MG PO TABS: ORAL_TABLET | ORAL | 0 refills | Status: DC

## 2020-04-28 MED ORDER — FOLIC ACID 1 MG PO TABS: 1.0000 mg | ORAL_TABLET | Freq: Every day | ORAL | 4 refills | Status: DC

## 2020-04-28 NOTE — Patient Instructions (Signed)
Cisplatin injection What is this medicine? CISPLATIN (SIS pla tin) is a chemotherapy drug. It targets fast dividing cells, like cancer cells, and causes these cells to die. This medicine is used to treat many types of cancer like bladder, ovarian, and testicular cancers. This medicine may be used for other purposes; ask your health care provider or pharmacist if you have questions. COMMON BRAND NAME(S): Platinol, Platinol -AQ What should I tell my health care provider before I take this medicine? They need to know if you have any of these conditions:  eye disease, vision problems  hearing problems  kidney disease  low blood counts, like white cells, platelets, or red blood cells  tingling of the fingers or toes, or other nerve disorder  an unusual or allergic reaction to cisplatin, carboplatin, oxaliplatin, other medicines, foods, dyes, or preservatives  pregnant or trying to get pregnant  breast-feeding How should I use this medicine? This drug is given as an infusion into a vein. It is administered in a hospital or clinic by a specially trained health care professional. Talk to your pediatrician regarding the use of this medicine in children. Special care may be needed. Overdosage: If you think you have taken too much of this medicine contact a poison control center or emergency room at once. NOTE: This medicine is only for you. Do not share this medicine with others. What if I miss a dose? It is important not to miss a dose. Call your doctor or health care professional if you are unable to keep an appointment. What may interact with this medicine? This medicine may interact with the following medications:  foscarnet  certain antibiotics like amikacin, gentamicin, neomycin, polymyxin B, streptomycin, tobramycin, vancomycin This list may not describe all possible interactions. Give your health care provider a list of all the medicines, herbs, non-prescription drugs, or dietary  supplements you use. Also tell them if you smoke, drink alcohol, or use illegal drugs. Some items may interact with your medicine. What should I watch for while using this medicine? Your condition will be monitored carefully while you are receiving this medicine. You will need important blood work done while you are taking this medicine. This drug may make you feel generally unwell. This is not uncommon, as chemotherapy can affect healthy cells as well as cancer cells. Report any side effects. Continue your course of treatment even though you feel ill unless your doctor tells you to stop. This medicine may increase your risk of getting an infection. Call your healthcare professional for advice if you get a fever, chills, or sore throat, or other symptoms of a cold or flu. Do not treat yourself. Try to avoid being around people who are sick. Avoid taking medicines that contain aspirin, acetaminophen, ibuprofen, naproxen, or ketoprofen unless instructed by your healthcare professional. These medicines may hide a fever. This medicine may increase your risk to bruise or bleed. Call your doctor or health care professional if you notice any unusual bleeding. Be careful brushing and flossing your teeth or using a toothpick because you may get an infection or bleed more easily. If you have any dental work done, tell your dentist you are receiving this medicine. Do not become pregnant while taking this medicine or for 14 months after stopping it. Women should inform their healthcare professional if they wish to become pregnant or think they might be pregnant. Men should not father a child while taking this medicine and for 11 months after stopping it. There is potential for  serious side effects to an unborn child. Talk to your healthcare professional for more information. Do not breast-feed an infant while taking this medicine. This medicine has caused ovarian failure in some women. This medicine may make it more  difficult to get pregnant. Talk to your healthcare professional if you are concerned about your fertility. This medicine has caused decreased sperm counts in some men. This may make it more difficult to father a child. Talk to your healthcare professional if you are concerned about your fertility. Drink fluids as directed while you are taking this medicine. This will help protect your kidneys. Call your doctor or health care professional if you get diarrhea. Do not treat yourself. What side effects may I notice from receiving this medicine? Side effects that you should report to your doctor or health care professional as soon as possible:  allergic reactions like skin rash, itching or hives, swelling of the face, lips, or tongue  blurred vision  changes in vision  decreased hearing or ringing of the ears  nausea, vomiting  pain, redness, or irritation at site where injected  pain, tingling, numbness in the hands or feet  signs and symptoms of bleeding such as bloody or black, tarry stools; red or dark brown urine; spitting up blood or brown material that looks like coffee grounds; red spots on the skin; unusual bruising or bleeding from the eyes, gums, or nose  signs and symptoms of infection like fever; chills; cough; sore throat; pain or trouble passing urine  signs and symptoms of kidney injury like trouble passing urine or change in the amount of urine  signs and symptoms of low red blood cells or anemia such as unusually weak or tired; feeling faint or lightheaded; falls; breathing problems Side effects that usually do not require medical attention (report to your doctor or health care professional if they continue or are bothersome):  loss of appetite  mouth sores  muscle cramps This list may not describe all possible side effects. Call your doctor for medical advice about side effects. You may report side effects to FDA at 1-800-FDA-1088. Where should I keep my  medicine? This drug is given in a hospital or clinic and will not be stored at home. NOTE: This sheet is a summary. It may not cover all possible information. If you have questions about this medicine, talk to your doctor, pharmacist, or health care provider.  2021 Elsevier/Gold Standard (2017-12-20 15:59:17) Pemetrexed injection What is this medicine? PEMETREXED (PEM e TREX ed) is a chemotherapy drug used to treat lung cancers like non-small cell lung cancer and mesothelioma. It may also be used to treat other cancers. This medicine may be used for other purposes; ask your health care provider or pharmacist if you have questions. COMMON BRAND NAME(S): Alimta What should I tell my health care provider before I take this medicine? They need to know if you have any of these conditions:  infection (especially a virus infection such as chickenpox, cold sores, or herpes)  kidney disease  low blood counts, like low white cell, platelet, or red cell counts  lung or breathing disease, like asthma  radiation therapy  an unusual or allergic reaction to pemetrexed, other medicines, foods, dyes, or preservative  pregnant or trying to get pregnant  breast-feeding How should I use this medicine? This drug is given as an infusion into a vein. It is administered in a hospital or clinic by a specially trained health care professional. Talk to your pediatrician regarding  the use of this medicine in children. Special care may be needed. Overdosage: If you think you have taken too much of this medicine contact a poison control center or emergency room at once. NOTE: This medicine is only for you. Do not share this medicine with others. What if I miss a dose? It is important not to miss your dose. Call your doctor or health care professional if you are unable to keep an appointment. What may interact with this medicine? This medicine may interact with the following medications:  Ibuprofen This list  may not describe all possible interactions. Give your health care provider a list of all the medicines, herbs, non-prescription drugs, or dietary supplements you use. Also tell them if you smoke, drink alcohol, or use illegal drugs. Some items may interact with your medicine. What should I watch for while using this medicine? Visit your doctor for checks on your progress. This drug may make you feel generally unwell. This is not uncommon, as chemotherapy can affect healthy cells as well as cancer cells. Report any side effects. Continue your course of treatment even though you feel ill unless your doctor tells you to stop. In some cases, you may be given additional medicines to help with side effects. Follow all directions for their use. Call your doctor or health care professional for advice if you get a fever, chills or sore throat, or other symptoms of a cold or flu. Do not treat yourself. This drug decreases your body's ability to fight infections. Try to avoid being around people who are sick. This medicine may increase your risk to bruise or bleed. Call your doctor or health care professional if you notice any unusual bleeding. Be careful brushing and flossing your teeth or using a toothpick because you may get an infection or bleed more easily. If you have any dental work done, tell your dentist you are receiving this medicine. Avoid taking products that contain aspirin, acetaminophen, ibuprofen, naproxen, or ketoprofen unless instructed by your doctor. These medicines may hide a fever. Call your doctor or health care professional if you get diarrhea or mouth sores. Do not treat yourself. To protect your kidneys, drink water or other fluids as directed while you are taking this medicine. Do not become pregnant while taking this medicine or for 6 months after stopping it. Women should inform their doctor if they wish to become pregnant or think they might be pregnant. Men should not father a child  while taking this medicine and for 3 months after stopping it. This may interfere with the ability to father a child. You should talk to your doctor or health care professional if you are concerned about your fertility. There is a potential for serious side effects to an unborn child. Talk to your health care professional or pharmacist for more information. Do not breast-feed an infant while taking this medicine or for 1 week after stopping it. What side effects may I notice from receiving this medicine? Side effects that you should report to your doctor or health care professional as soon as possible:  allergic reactions like skin rash, itching or hives, swelling of the face, lips, or tongue  breathing problems  redness, blistering, peeling or loosening of the skin, including inside the mouth  signs and symptoms of bleeding such as bloody or black, tarry stools; red or dark-brown urine; spitting up blood or brown material that looks like coffee grounds; red spots on the skin; unusual bruising or bleeding from the eye,  gums, or nose  signs and symptoms of infection like fever or chills; cough; sore throat; pain or trouble passing urine  signs and symptoms of kidney injury like trouble passing urine or change in the amount of urine  signs and symptoms of liver injury like dark yellow or brown urine; general ill feeling or flu-like symptoms; light-colored stools; loss of appetite; nausea; right upper belly pain; unusually weak or tired; yellowing of the eyes or skin Side effects that usually do not require medical attention (report to your doctor or health care professional if they continue or are bothersome):  constipation  mouth sores  nausea, vomiting  unusually weak or tired This list may not describe all possible side effects. Call your doctor for medical advice about side effects. You may report side effects to FDA at 1-800-FDA-1088. Where should I keep my medicine? This drug is given  in a hospital or clinic and will not be stored at home. NOTE: This sheet is a summary. It may not cover all possible information. If you have questions about this medicine, talk to your doctor, pharmacist, or health care provider.  2021 Elsevier/Gold Standard (2017-02-13 16:11:33)

## 2020-04-28 NOTE — Progress Notes (Signed)
[facility] Telephone:([phone]   Fax:([phone]  OFFICE PROGRESS NOTE  [name], NP [facility] [place] [address]  DIAGNOSIS: Stage IIA (T2b, N0, M0) non-small cell lung cancer, adenocarcinoma presented with right upper lobe lung mass diagnosed in February 2022.  Biomarker Findings Microsatellite status - MS-Stable Tumor Mutational Burden - 4 Muts/Mb Genomic Findings For a complete list of the genes assayed, please refer to the Appendix. KRAS G12D KEAP1 P278S 7 Disease relevant genes with no reportable alterations: ALK, BRAF, EGFR, ERBB2, MET, RET, ROS1  PDL1 Expression 5%.   PRIOR THERAPY: Status post robotic assisted right video thoracoscopy with right upper lobectomy and mediastinal lymph node sampling under the care of Dr. Kipp Brood on March 28, 2020  CURRENT THERAPY: Adjuvant systemic chemotherapy with cisplatin 75 Mg/M2 and Alimta 500 Mg/M2 every 3 weeks.  First dose May 05, 2020.  INTERVAL HISTORY: Jerry [name] 45 y.o. male returns to the clinic today for follow-up visit accompanied by his wife.  The patient is feeling fine today with no concerning complaints except for pain and bulging at the right lateral chest wall at the incision site.  He denied having any current cough, shortness of breath or hemoptysis.  He denied having any fever or chills.  He has no nausea, vomiting, diarrhea or constipation.  He has no headache or visual changes.  He underwent right upper lobectomy with lymph node sampling under the care of Dr. Kipp Brood on March 29, 1998 2020 final pathology was consistent with adenocarcinoma measuring 4.3 cm with no evidence of lymph node involvement.  The patient is here today for evaluation and discussion of his adjuvant treatment options.  MEDICAL HISTORY: Past Medical History:  Diagnosis Date  . Cancer ([facility])   . 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 Former smoker   . Pneumonia     ALLERGIES:  has No Known  Allergies.  MEDICATIONS:  Current Outpatient Medications  Medication Sig Dispense Refill  . nicotine polacrilex (NICORETTE) 4 MG gum Take 2 mg by mouth as needed for smoking cessation.    . traMADol (ULTRAM) 50 MG tablet Take 1 tablet (50 mg total) by mouth every 6 (six) hours as needed (mild pain). 28 tablet 0   No current facility-administered medications for this visit.    SURGICAL HISTORY:  Past Surgical History:  Procedure Laterality Date  . INTERCOSTAL NERVE BLOCK Right 03/28/2020   Procedure: INTERCOSTAL NERVE BLOCK;  Surgeon: [name], MD;  Location: [facility];  Service: Thoracic;  Laterality: Right;  . LYMPH NODE DISSECTION Right 03/28/2020   Procedure: LYMPH NODE DISSECTION;  Surgeon: [name], MD;  Location: [facility];  Service: Thoracic;  Laterality: Right;  Marland Kitchen VIDEO BRONCHOSCOPY WITH ENDOBRONCHIAL NAVIGATION Right 03/02/2020   Procedure: VIDEO BRONCHOSCOPY WITH ENDOBRONCHIAL NAVIGATION;  Surgeon: Garner Nash, DO;  Location: [facility];  Service: Pulmonary;  Laterality: Right;  Marland Kitchen VIDEO BRONCHOSCOPY WITH ENDOBRONCHIAL ULTRASOUND Bilateral 03/02/2020   Procedure: VIDEO BRONCHOSCOPY WITH ENDOBRONCHIAL ULTRASOUND;  Surgeon: Garner Nash, DO;  Location: [facility];  Service: Pulmonary;  Laterality: Bilateral;  . WISDOM TOOTH EXTRACTION      REVIEW OF SYSTEMS:  Constitutional: positive for fatigue Eyes: negative Ears, nose, mouth, throat, and face: negative Respiratory: positive for pleurisy/chest pain Cardiovascular: negative Gastrointestinal: negative Genitourinary:negative Integument/breast: negative Hematologic/lymphatic: negative Musculoskeletal:negative Neurological: negative Behavioral/Psych: negative Endocrine: negative Allergic/Immunologic: negative   PHYSICAL EXAMINATION: General appearance: alert, cooperative and no distress Head: Normocephalic,  without obvious abnormality, atraumatic Neck: no adenopathy, no JVD, supple, symmetrical, trachea  midline and thyroid not enlarged, symmetric, no tenderness/mass/nodules Lymph nodes: Cervical, supraclavicular, and axillary nodes normal. Resp: clear to auscultation bilaterally Back: symmetric, no curvature. ROM normal. No CVA tenderness. Cardio: regular rate and rhythm, S1, S2 normal, no murmur, click, rub or gallop GI: soft, non-tender; bowel sounds normal; no masses,  no organomegaly Extremities: extremities normal, atraumatic, no cyanosis or edema Neurologic: Alert and oriented X 3, normal strength and tone. Normal symmetric reflexes. Normal coordination and gait  ECOG PERFORMANCE STATUS: 1 - Symptomatic but completely ambulatory  Blood pressure (!) 116/96, pulse 64, temperature 97.8 F (36.6 C), temperature source Tympanic, resp. rate 20, height 5' 11"  (1.803 m), weight 289 lb 6.4 oz (131.3 kg), SpO2 97 %.  LABORATORY DATA: Lab Results  Component Value Date   WBC 4.5 04/28/2020   HGB 14.6 04/28/2020   HCT 43.2 04/28/2020   MCV 90.4 04/28/2020   PLT 224 04/28/2020      Chemistry      Component Value Date/Time   NA 138 03/30/2020 0052   NA 142 02/22/2020 0948   K 3.8 03/30/2020 0052   CL 106 03/30/2020 0052   CO2 26 03/30/2020 0052   BUN 22 (H) 03/30/2020 0052   BUN 11 02/22/2020 0948   CREATININE 1.04 03/30/2020 0052   CREATININE 0.93 03/14/2020 1539      Component Value Date/Time   CALCIUM 8.4 (L) 03/30/2020 0052   ALKPHOS 36 (L) 03/30/2020 0052   AST 19 03/30/2020 0052   AST 16 03/14/2020 1539   ALT 16 03/30/2020 0052   ALT 23 03/14/2020 1539   BILITOT 0.7 03/30/2020 0052   BILITOT 0.3 03/14/2020 1539       RADIOGRAPHIC STUDIES: DG Chest 2 View  Result Date: 03/31/2020 CLINICAL DATA:  Right chest tube removed EXAM: CHEST - 2 VIEW COMPARISON:  Earlier study of 0634 hours FINDINGS: Interval removal of RIGHT thoracostomy tube. Small RIGHT apical pneumothorax unchanged. Normal heart size, mediastinal contours, and pulmonary vascularity. Central peribronchial  thickening. No pulmonary infiltrate, pleural effusion, or LEFT pneumothorax. Osseous structures unremarkable. IMPRESSION: Persistent small RIGHT apical pneumothorax following chest tube removal. Bronchitic changes without infiltrate. Electronically Signed   By: Lavonia Dana M.D.   On: 03/31/2020 12:54   DG CHEST PORT 1 VIEW  Result Date: 03/31/2020 CLINICAL DATA:  Chest tube.  Status post lobectomy. EXAM: PORTABLE CHEST 1 VIEW COMPARISON:  03/30/2020. FINDINGS: Patient rotated to the right. Prior right upper lobectomy. Right chest tube in stable position. Small right apical pneumothorax again noted without interim change. Heart size stable. IMPRESSION: Prior right upper lobectomy. Right chest tube in stable position. Right apical pneumothorax again noted without interval change. Electronically Signed   By: Marcello Moores  Register   On: 03/31/2020 07:18   DG CHEST PORT 1 VIEW  Result Date: 03/30/2020 CLINICAL DATA:  Chest 2, prior lung surgery EXAM: PORTABLE CHEST 1 VIEW COMPARISON:  Radiograph 03/29/2020, and CT 03/01/2020 FINDINGS: Right apically directed chest tube remains in place with some interval repositioning. Small residual right apical pneumothorax remains. No left pneumothorax. No layering effusion. Some atelectatic changes are fairly similar to prior. No new focal consolidative process or convincing features of edema at this time. Stable cardiomediastinal contours. No acute osseous or soft tissue abnormality. IMPRESSION: Interval repositioning of a right apically directed chest tube with small residual right apical pneumothorax. Atelectasis and low volumes. Electronically Signed   By: Elwin Sleight.D.  On: 03/30/2020 06:44    ASSESSMENT AND PLAN: This is a very pleasant 45 years old white male recently diagnosed with a stage IIa (T2b, N0, M0) non-small cell lung cancer, adenocarcinoma status post right upper lobectomy with lymph node sampling under the care of Dr. Kipp Brood on March 28, 2020. The  patient had molecular studies by [facility] that showed no actionable mutation and his PD-L1 expression was 5%. I had a lengthy discussion with the patient and his wife today about his current disease stage, prognosis and treatment options. I recommended for the patient adjuvant treatment with systemic chemotherapy with cisplatin 75 Mg/M2 and Alimta 500 Mg/M2 every 3 weeks for 4 cycles.  The patient was also given the option of observation. He is interested in proceeding with the systemic chemotherapy. I will arrange for the patient to have a chemotherapy education class before the first dose of his treatment. I will call his pharmacy with prescription for Decadron 4 mg p.o. twice daily the day before, day of and day after chemotherapy in addition to folic acid 1 mg p.o. daily and Compazine 10 mg p.o. every 6 hours as needed for nausea. The patient is expected to start the first cycle of his treatment next week. He will receive vitamin B12 injection today. The patient will come back for follow-up visit in 2 weeks for evaluation and management of any adverse effect of his treatment. He was advised to call immediately if he has any other concerning symptoms in the interval. The patient voices understanding of current disease status and treatment options and is in agreement with the current care plan.  All questions were answered. The patient knows to call the clinic with any problems, questions or concerns. We can certainly see the patient much sooner if necessary.  The total time spent in the appointment was 40 minutes.  Disclaimer: This note was dictated with voice recognition software. Similar sounding words can inadvertently be transcribed and may not be corrected upon review.

## 2020-04-28 NOTE — Progress Notes (Signed)
START ON PATHWAY REGIMEN - Non-Small Cell Lung     A cycle is every 21 days:     Pemetrexed      Cisplatin   **Always confirm dose/schedule in your pharmacy ordering system**  Patient Characteristics: Postoperative without Neoadjuvant Therapy (Pathologic Staging), Stage IIA, Adjuvant Chemotherapy, Nonsquamous Cell Therapeutic Status: Postoperative without Neoadjuvant Therapy (Pathologic Staging) AJCC T Category: pT2b AJCC N Category: pN0 AJCC M Category: cM0 AJCC 8 Stage Grouping: IIA Histology: Nonsquamous Cell Intent of Therapy: Curative Intent, Discussed with Patient

## 2020-05-02 ENCOUNTER — Telehealth: Payer: Self-pay | Admitting: Internal Medicine

## 2020-05-02 NOTE — Telephone Encounter (Signed)
Scheduled per 04/22 los, patient has been called and voicemail was left.

## 2020-05-03 ENCOUNTER — Inpatient Hospital Stay: Payer: 59

## 2020-05-03 ENCOUNTER — Telehealth: Payer: Self-pay | Admitting: Physician Assistant

## 2020-05-03 ENCOUNTER — Other Ambulatory Visit: Payer: Self-pay

## 2020-05-03 NOTE — Telephone Encounter (Signed)
Scheduled per 04/21 los, patient has been called and notified of upcoming appointments.

## 2020-05-06 ENCOUNTER — Inpatient Hospital Stay: Payer: 59

## 2020-05-06 ENCOUNTER — Other Ambulatory Visit: Payer: Self-pay

## 2020-05-06 VITALS — BP 137/70 | HR 76 | Temp 97.9°F | Resp 17

## 2020-05-06 DIAGNOSIS — C3491 Malignant neoplasm of unspecified part of right bronchus or lung: Secondary | ICD-10-CM

## 2020-05-06 DIAGNOSIS — Z5111 Encounter for antineoplastic chemotherapy: Secondary | ICD-10-CM | POA: Diagnosis not present

## 2020-05-06 LAB — CMP (CANCER CENTER ONLY)
ALT: 25 U/L (ref 0–44)
AST: 20 U/L (ref 15–41)
Albumin: 5 g/dL (ref 3.5–5.0)
Alkaline Phosphatase: 52 U/L (ref 38–126)
Anion gap: 10 (ref 5–15)
BUN: 25 mg/dL — ABNORMAL HIGH (ref 6–20)
CO2: 27 mmol/L (ref 22–32)
Calcium: 10 mg/dL (ref 8.9–10.3)
Chloride: 101 mmol/L (ref 98–111)
Creatinine: 0.94 mg/dL (ref 0.61–1.24)
GFR, Estimated: >60 mL/min (ref >60)
Glucose, Bld: 171 mg/dL — ABNORMAL HIGH (ref 70–99)
Potassium: 3.9 mmol/L (ref 3.5–5.1)
Sodium: 138 mmol/L (ref 135–145)
Total Bilirubin: 0.6 mg/dL (ref 0.3–1.2)
Total Protein: 7.7 g/dL (ref 6.5–8.1)

## 2020-05-06 LAB — CBC WITH DIFFERENTIAL (CANCER CENTER ONLY)
Abs Immature Granulocytes: 0.02 10*3/uL (ref 0.00–0.07)
Basophils Absolute: 0 10*3/uL (ref 0.0–0.1)
Basophils Relative: 0 %
Eosinophils Absolute: 0 10*3/uL (ref 0.0–0.5)
Eosinophils Relative: 0 %
HCT: 43.6 % (ref 39.0–52.0)
Hemoglobin: 14.8 g/dL (ref 13.0–17.0)
Immature Granulocytes: 1 %
Lymphocytes Relative: 11 %
Lymphs Abs: 0.5 10*3/uL — ABNORMAL LOW (ref 0.7–4.0)
MCH: 30.5 pg (ref 26.0–34.0)
MCHC: 33.9 g/dL (ref 30.0–36.0)
MCV: 89.7 fL (ref 80.0–100.0)
Monocytes Absolute: 0.1 10*3/uL (ref 0.1–1.0)
Monocytes Relative: 1 %
Neutro Abs: 3.7 10*3/uL (ref 1.7–7.7)
Neutrophils Relative %: 87 %
Platelet Count: 241 10*3/uL (ref 150–400)
RBC: 4.86 MIL/uL (ref 4.22–5.81)
RDW: 12.5 % (ref 11.5–15.5)
WBC Count: 4.2 10*3/uL (ref 4.0–10.5)
nRBC: 0 % (ref 0.0–0.2)

## 2020-05-06 LAB — MAGNESIUM: Magnesium: 2.1 mg/dL (ref 1.7–2.4)

## 2020-05-06 MED ORDER — SODIUM CHLORIDE 0.9 % IV SOLN
10.0000 mg | Freq: Once | INTRAVENOUS | Status: AC
  Administered 2020-05-06: 10 mg via INTRAVENOUS
  Filled 2020-05-06: qty 10

## 2020-05-06 MED ORDER — SODIUM CHLORIDE 0.9 % IV SOLN
500.0000 mg/m2 | Freq: Once | INTRAVENOUS | Status: AC
  Administered 2020-05-06: 1300 mg via INTRAVENOUS
  Filled 2020-05-06: qty 12

## 2020-05-06 MED ORDER — SODIUM CHLORIDE 0.9 % IV SOLN
Freq: Once | INTRAVENOUS | Status: AC
Start: 2020-05-06 — End: 2020-05-06
  Filled 2020-05-06: qty 250

## 2020-05-06 MED ORDER — PALONOSETRON HCL INJECTION 0.25 MG/5ML
INTRAVENOUS | Status: AC
  Filled 2020-05-06: qty 5

## 2020-05-06 MED ORDER — SODIUM CHLORIDE 0.9 % IV SOLN
75.0000 mg/m2 | Freq: Once | INTRAVENOUS | Status: AC
  Administered 2020-05-06: 192 mg via INTRAVENOUS
  Filled 2020-05-06: qty 192

## 2020-05-06 MED ORDER — MAGNESIUM SULFATE 2 GM/50ML IV SOLN
2.0000 g | Freq: Once | INTRAVENOUS | Status: AC
Start: 2020-05-06 — End: 2020-05-06
  Administered 2020-05-06: 2 g via INTRAVENOUS
  Filled 2020-05-06: qty 50

## 2020-05-06 MED ORDER — PALONOSETRON HCL INJECTION 0.25 MG/5ML
0.2500 mg | Freq: Once | INTRAVENOUS | Status: AC
  Administered 2020-05-06: 0.25 mg via INTRAVENOUS

## 2020-05-06 MED ORDER — SODIUM CHLORIDE 0.9 % IV SOLN
150.0000 mg | Freq: Once | INTRAVENOUS | Status: AC
  Administered 2020-05-06: 150 mg via INTRAVENOUS
  Filled 2020-05-06: qty 150

## 2020-05-06 MED ORDER — POTASSIUM CHLORIDE IN NACL 20-0.9 MEQ/L-% IV SOLN
Freq: Once | INTRAVENOUS | Status: AC
  Filled 2020-05-06: qty 1000

## 2020-05-06 NOTE — Patient Instructions (Signed)
[facility] [place]  Discharge Instructions: Thank you for choosing [facility] to provide your oncology and hematology care.   If you have a lab appointment with the [facility], please go directly to the [facility] and check in at the registration area.  Wear comfortable clothing and clothing appropriate for easy access to any Portacath or PICC line.   We strive to give you quality time with your provider. You may need to reschedule your appointment if you arrive late (15 or more minutes).  Arriving late affects you and other patients whose appointments are after yours.  Also, if you miss three or more appointments without notifying the office, you may be dismissed from the clinic at the provider's discretion.      For prescription refill requests, have your pharmacy contact our office and allow 72 hours for refills to be completed.    Today you received the following chemotherapy and/or immunotherapy agents Alimta and Cisplatin      To help prevent nausea and vomiting after your treatment, we encourage you to take your nausea medication as directed.  BELOW ARE SYMPTOMS THAT SHOULD BE REPORTED IMMEDIATELY: . *FEVER GREATER THAN 100.4 F (38 C) OR HIGHER . *CHILLS OR SWEATING . *NAUSEA AND VOMITING THAT IS NOT CONTROLLED WITH YOUR NAUSEA MEDICATION . *UNUSUAL SHORTNESS OF BREATH . *UNUSUAL BRUISING OR BLEEDING . *URINARY PROBLEMS (pain or burning when urinating, or frequent urination) . *BOWEL PROBLEMS (unusual diarrhea, constipation, pain near the anus) . TENDERNESS IN MOUTH AND THROAT WITH OR WITHOUT PRESENCE OF ULCERS (sore throat, sores in mouth, or a toothache) . UNUSUAL RASH, SWELLING OR PAIN  . UNUSUAL VAGINAL DISCHARGE OR ITCHING   Items with * indicate a potential emergency and should be followed up as soon as possible or go to the Emergency Department if any problems should occur.  Please show the CHEMOTHERAPY ALERT CARD or IMMUNOTHERAPY  ALERT CARD at check-in to the Emergency Department and triage nurse. Should you have questions after your visit or need to cancel or reschedule your appointment, please contact [facility]  [phone] and follow the prompts.  Office hours are 8:00 a.m. to 4:30 p.m. Monday - Friday. Please note that voicemails left after 4:00 p.m. may not be returned until the following business day.  We are closed weekends and major holidays. You have access to a nurse at all times for urgent questions. Please call the main number to the clinic [phone] and follow the prompts.  For any non-urgent questions, you may also contact your provider using MyChart. We now offer e-Visits for anyone 21 and older to request care online for non-urgent symptoms. For details visit [email or web address].   Also download the MyChart app! Go to the app store, search "MyChart", open the app, select [facility], and log in with your MyChart username and password.  Due to Covid, a mask is required upon entering the hospital/clinic. If you do not have a mask, one will be given to you upon arrival. For doctor visits, patients may have 1 support person aged 33 or older with them. For treatment visits, patients cannot have anyone with them due to current Covid guidelines and our immunocompromised population.

## 2020-05-06 NOTE — Progress Notes (Signed)
Per Dr Marin Olp, on call MD ok to run post hydration with cisplatin. dph

## 2020-05-09 ENCOUNTER — Telehealth: Payer: Self-pay | Admitting: *Deleted

## 2020-05-09 ENCOUNTER — Other Ambulatory Visit: Payer: Self-pay

## 2020-05-09 ENCOUNTER — Inpatient Hospital Stay (HOSPITAL_BASED_OUTPATIENT_CLINIC_OR_DEPARTMENT_OTHER): Payer: 59 | Admitting: Medical

## 2020-05-09 ENCOUNTER — Inpatient Hospital Stay: Payer: 59 | Attending: Internal Medicine

## 2020-05-09 VITALS — BP 132/82 | HR 52 | Temp 97.5°F | Resp 18 | Ht 71.0 in | Wt 285.6 lb

## 2020-05-09 DIAGNOSIS — R112 Nausea with vomiting, unspecified: Secondary | ICD-10-CM

## 2020-05-09 DIAGNOSIS — Z5111 Encounter for antineoplastic chemotherapy: Secondary | ICD-10-CM | POA: Insufficient documentation

## 2020-05-09 DIAGNOSIS — Z79899 Other long term (current) drug therapy: Secondary | ICD-10-CM | POA: Diagnosis not present

## 2020-05-09 DIAGNOSIS — C3491 Malignant neoplasm of unspecified part of right bronchus or lung: Secondary | ICD-10-CM

## 2020-05-09 DIAGNOSIS — C3411 Malignant neoplasm of upper lobe, right bronchus or lung: Secondary | ICD-10-CM | POA: Insufficient documentation

## 2020-05-09 LAB — CBC WITH DIFFERENTIAL (CANCER CENTER ONLY)
Abs Immature Granulocytes: 0.03 10*3/uL (ref 0.00–0.07)
Basophils Absolute: 0 10*3/uL (ref 0.0–0.1)
Basophils Relative: 0 %
Eosinophils Absolute: 0.1 10*3/uL (ref 0.0–0.5)
Eosinophils Relative: 1 %
HCT: 41.5 % (ref 39.0–52.0)
Hemoglobin: 14.3 g/dL (ref 13.0–17.0)
Immature Granulocytes: 0 %
Lymphocytes Relative: 21 %
Lymphs Abs: 1.5 10*3/uL (ref 0.7–4.0)
MCH: 30.4 pg (ref 26.0–34.0)
MCHC: 34.5 g/dL (ref 30.0–36.0)
MCV: 88.3 fL (ref 80.0–100.0)
Monocytes Absolute: 0.4 10*3/uL (ref 0.1–1.0)
Monocytes Relative: 5 %
Neutro Abs: 5.2 10*3/uL (ref 1.7–7.7)
Neutrophils Relative %: 73 %
Platelet Count: 227 10*3/uL (ref 150–400)
RBC: 4.7 MIL/uL (ref 4.22–5.81)
RDW: 12.7 % (ref 11.5–15.5)
WBC Count: 7.2 10*3/uL (ref 4.0–10.5)
nRBC: 0 % (ref 0.0–0.2)

## 2020-05-09 LAB — CMP (CANCER CENTER ONLY)
ALT: 16 U/L (ref 0–44)
AST: 15 U/L (ref 15–41)
Albumin: 4 g/dL (ref 3.5–5.0)
Alkaline Phosphatase: 44 U/L (ref 38–126)
Anion gap: 9 (ref 5–15)
BUN: 16 mg/dL (ref 6–20)
CO2: 29 mmol/L (ref 22–32)
Calcium: 9 mg/dL (ref 8.9–10.3)
Chloride: 101 mmol/L (ref 98–111)
Creatinine: 0.88 mg/dL (ref 0.61–1.24)
GFR, Estimated: >60 mL/min (ref >60)
Glucose, Bld: 99 mg/dL (ref 70–99)
Potassium: 3.9 mmol/L (ref 3.5–5.1)
Sodium: 139 mmol/L (ref 135–145)
Total Bilirubin: 0.6 mg/dL (ref 0.3–1.2)
Total Protein: 6.8 g/dL (ref 6.5–8.1)

## 2020-05-09 LAB — MAGNESIUM: Magnesium: 1.8 mg/dL (ref 1.7–2.4)

## 2020-05-09 MED ORDER — SODIUM CHLORIDE 0.9 % IV SOLN
25.0000 mg | Freq: Four times a day (QID) | INTRAVENOUS | Status: DC | PRN
  Administered 2020-05-09: 25 mg via INTRAVENOUS
  Filled 2020-05-09: qty 1

## 2020-05-09 MED ORDER — ONDANSETRON 8 MG PO TBDP: 8.0000 mg | ORAL_TABLET | Freq: Three times a day (TID) | ORAL | 0 refills | Status: DC | PRN

## 2020-05-09 MED ORDER — DEXAMETHASONE SODIUM PHOSPHATE 100 MG/10ML IJ SOLN
10.0000 mg | Freq: Once | INTRAMUSCULAR | Status: AC
  Administered 2020-05-09: 10 mg via INTRAVENOUS
  Filled 2020-05-09: qty 10

## 2020-05-09 MED ORDER — SODIUM CHLORIDE 0.9 % IV SOLN
INTRAVENOUS | Status: DC
  Filled 2020-05-09 (×2): qty 250

## 2020-05-09 MED ORDER — LORAZEPAM 0.5 MG PO TABS: 0.5000 mg | ORAL_TABLET | Freq: Three times a day (TID) | ORAL | 0 refills | Status: DC | PRN

## 2020-05-09 MED ORDER — DEXAMETHASONE SODIUM PHOSPHATE 10 MG/ML IJ SOLN
INTRAMUSCULAR | Status: AC
  Filled 2020-05-09: qty 1

## 2020-05-09 MED ORDER — DEXAMETHASONE 4 MG PO TABS: ORAL_TABLET | ORAL | 0 refills | Status: DC

## 2020-05-09 NOTE — Telephone Encounter (Signed)
Wife states Jerry [name] took chemo on Friday (Cisplatin). Has been having chills, no fever. Vomiting and diarrhea since last night. Hs taken compazine every 6 hours and has taken decadron as prescribed.  Will bring into [facility] at 1030 for labs and 1100 to see Sandi Mealy, PA  Orders placed, message to scheduler

## 2020-05-09 NOTE — Progress Notes (Signed)
[facility] Progress Note   Jerry [name] [id number] [birth year] 45 y.o.  Jerry [name] is managed by Dr. Fanny Bien. Jerry [name]  Actively treated with chemotherapy/immunotherapy/hormonal therapy: yes  Current therapy: Adjuvant systemic chemotherapy with cisplatin 75 Mg/M2 and Alimta 500 Mg/M2 every 3 weeks.  Last treated: 05/06/2020 (cycle #1, day #1)  Next scheduled appointment with provider: 05/11/2020  Assessment: Plan:    Adenocarcinoma of right lung, stage 2 (HCC)  Non-intractable vomiting with nausea, unspecified vomiting type - Plan: 0.9 %  sodium chloride infusion, dexamethasone (DECADRON) 10 mg in sodium chloride 0.9 % 50 mL IVPB, promethazine (PHENERGAN) 25 mg in sodium chloride 0.9 % 50 mL IVPB   Stage IIA (T2b, N0, M0) non-small cell lung cancer, adenocarcinoma presented with right upper lobe lung mass diagnosed in February 2022. He is  Status post cycle #1, day #1 of adjuvant systemic chemotherapy with cisplatin 75 Mg/M2 and Alimta 500 Mg/M2 every 3 weeks which was dosed on 05/06/2020: Jerry [name] is scheduled to be seen in follow-up by Dr. Fanny Bien. Jerry [name] on 05/11/2020.  Non-intractable nausea and vomiting: The patient was given 1 L of normal saline IV today, Decadron 10 mg IV x1 and Phenergan 25 mg IV x1 today.  Additionally prescriptions for Zofran and Ativan were sent to his pharmacy.  He was given a refill of Decadron.  Please see After Visit Summary for patient specific instructions.  Future Appointments  Date Time Provider [place]  05/11/2020  7:45 AM [facility] None  05/11/2020  8:15 AM Curt Bears, MD [facility] None  05/13/2020 11:00 AM [name], MD [facility]  05/17/2020  4:30 PM Icard, Octavio Graves, DO [facility] None  05/18/2020 11:00 AM [facility] None  05/23/2020  3:30 PM [name], NP [facility] None  05/26/2020  7:45 AM [facility] None  05/26/2020  8:15 AM  Curt Bears, MD [facility] None  05/26/2020  9:15 AM [facility] INFUSION [facility] None  06/01/2020 11:00 AM [facility] None  06/08/2020 11:00 AM [facility] None  06/16/2020  8:30 AM [facility] None  06/16/2020  9:30 AM Heilingoetter, Cassandra L, PA-C [facility] None  06/16/2020 10:30 AM [facility] INFUSION [facility] None  06/22/2020 11:00 AM [facility] None  06/29/2020 11:00 AM [facility] None  07/07/2020  8:30 AM [facility] None  07/07/2020  9:00 AM Heilingoetter, Cassandra L, PA-C [facility] None  07/07/2020 10:00 AM [facility] INFUSION [facility] None    No orders of the defined types were placed in this encounter.      Subjective:   Patient ID:  Jerry [name] is a 45 y.o. (DOB [birth year]) male.  Chief Complaint: No chief complaint on file.   HPI Jerry [name]  is a 45 y.o. male with a diagnosis of stage IIA (T2b, N0, M0) non-small cell lung cancer, adenocarcinoma presented with right upper lobe lung mass diagnosed in February 2022. He is  Status post cycle #1, day #1 of adjuvant systemic chemotherapy with cisplatin 75 Mg/M2 and Alimta 500 Mg/M2 every 3 weeks which was dosed on 05/06/2020.Marland Kitchen  He reports that he started to have nausea and vomiting on Saturday.  His last p.o. intake was Saturday morning.  He has had nothing to eat since then but has had continued vomiting.  He had vomiting this morning.  He reports chills, nausea, vomiting, and diarrhea.  He has been taking Compazine for his nausea without benefit.  He has no appetite.  Medications: I have reviewed the patient's current medications.  Allergies: No Known Allergies  Past Medical History:  Diagnosis Date  . Cancer ([place])   . 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 Former smoker   . Pneumonia     Past Surgical History:  Procedure Laterality Date  . INTERCOSTAL NERVE BLOCK Right 03/28/2020   Procedure: INTERCOSTAL NERVE  BLOCK;  Surgeon: [name], MD;  Location: [facility];  Service: Thoracic;  Laterality: Right;  . LYMPH NODE DISSECTION Right 03/28/2020   Procedure: LYMPH NODE DISSECTION;  Surgeon: [name], MD;  Location: [facility];  Service: Thoracic;  Laterality: Right;  Marland Kitchen VIDEO BRONCHOSCOPY WITH ENDOBRONCHIAL NAVIGATION Right 03/02/2020   Procedure: VIDEO BRONCHOSCOPY WITH ENDOBRONCHIAL NAVIGATION;  Surgeon: Garner Nash, DO;  Location: [facility];  Service: Pulmonary;  Laterality: Right;  Marland Kitchen VIDEO BRONCHOSCOPY WITH ENDOBRONCHIAL ULTRASOUND Bilateral 03/02/2020   Procedure: VIDEO BRONCHOSCOPY WITH ENDOBRONCHIAL ULTRASOUND;  Surgeon: Garner Nash, DO;  Location: [facility];  Service: Pulmonary;  Laterality: Bilateral;  . WISDOM TOOTH EXTRACTION      Family History  Problem Relation Age of Onset  . Stroke Father   . Hypertension Father   . Hyperlipidemia Father   . Arrhythmia Father   . Lung cancer Paternal Aunt     Social History   Socioeconomic History  . Marital status: Married    Spouse name: Not on file  . Number of children: Not on file  . Years of education: Not on file  . Highest education level: Not on file  Occupational History  . Occupation: Clinical biochemist  Tobacco Use  . Smoking status: Former Smoker    Packs/day: 1.00    Years: 17.00    Pack years: 17.00    Types: Cigarettes    Start date: 03/01/1991    Quit date: 02/29/2008    Years since quitting: 12.2  . Smokeless tobacco: Never Used  . Tobacco comment: still using vape  Vaping Use  . Vaping Use: Every day  . Start date: 02/08/2013  . Last attempt to quit: 02/29/2020  . Substances: Nicotine  Substance and Sexual Activity  . 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  . Drug use: Never  . Sexual activity: Not on file  Other Topics Concern  . Not on file  Social History Narrative  . Not on file   Social Determinants of Health   Financial Resource Strain: Not on file  Food Insecurity: Not on file   Transportation Needs: Not on file  Physical Activity: Not on file  Stress: Not on file  Social Connections: Not on file  Intimate Partner Violence: Not on file    Past Medical History, Surgical history, Social history, and Family history were reviewed and updated as appropriate.   Please see review of systems for further details on the patient's review from today.   Review of Systems:  Review of Systems  Constitutional: Positive for appetite change and chills. Negative for diaphoresis and fever.  Respiratory: Negative for cough, chest tightness and shortness of breath.   Cardiovascular: Negative for chest pain, palpitations and leg swelling.  Gastrointestinal: Positive for diarrhea, nausea and vomiting. Negative for abdominal distention, abdominal pain, blood in stool and constipation.  Genitourinary: Negative for decreased urine volume and difficulty urinating.  Neurological: Negative for weakness.    Objective:   Physical Exam:  BP 132/82 (BP Location: Left Arm, Patient Position: Sitting)   Pulse (!) 52   Temp Marland Kitchen)  97.5 F (36.4 C) (Tympanic)   Resp 18   Ht 5\' 11"  (1.803 m)   Wt 285 lb 9.6 oz (129.5 kg)   SpO2 98%   BMI 39.83 kg/m  ECOG: 0  Physical Exam Constitutional:      General: He is not in acute distress.    Appearance: He is not diaphoretic.  HENT:     Head: Normocephalic and atraumatic.  Eyes:     General: No scleral icterus.       Right eye: No discharge.        Left eye: No discharge.     Conjunctiva/sclera: Conjunctivae normal.  Cardiovascular:     Rate and Rhythm: Normal rate and regular rhythm.     Heart sounds: Normal heart sounds. No murmur heard. No friction rub. No gallop.   Pulmonary:     Effort: Pulmonary effort is normal. No respiratory distress.     Breath sounds: Normal breath sounds. No wheezing or rales.  Skin:    General: Skin is warm and dry.     Findings: No erythema or rash.  Neurological:     Mental Status: He is alert.      Coordination: Coordination normal.     Gait: Gait normal.  Psychiatric:        Mood and Affect: Mood normal.        Behavior: Behavior normal.        Thought Content: Thought content normal.        Judgment: Judgment normal.     Lab Review:     Component Value Date/Time   NA 139 05/09/2020 1045   NA 142 02/22/2020 0948   K 3.9 05/09/2020 1045   CL 101 05/09/2020 1045   CO2 29 05/09/2020 1045   GLUCOSE 99 05/09/2020 1045   BUN 16 05/09/2020 1045   BUN 11 02/22/2020 0948   CREATININE 0.88 05/09/2020 1045   CALCIUM 9.0 05/09/2020 1045   PROT 6.8 05/09/2020 1045   PROT 6.5 02/22/2020 0948   ALBUMIN 4.0 05/09/2020 1045   ALBUMIN 4.8 02/22/2020 0948   AST 15 05/09/2020 1045   ALT 16 05/09/2020 1045   ALKPHOS 44 05/09/2020 1045   BILITOT 0.6 05/09/2020 1045   GFRNONAA >60 05/09/2020 1045   GFRAA 108 02/22/2020 0948       Component Value Date/Time   WBC 7.2 05/09/2020 1045   WBC 7.5 03/30/2020 0052   RBC 4.70 05/09/2020 1045   HGB 14.3 05/09/2020 1045   HGB 14.8 02/22/2020 0948   HCT 41.5 05/09/2020 1045   HCT 43.3 02/22/2020 0948   PLT 227 05/09/2020 1045   PLT 220 02/22/2020 0948   MCV 88.3 05/09/2020 1045   MCV 90 02/22/2020 0948   MCH 30.4 05/09/2020 1045   MCHC 34.5 05/09/2020 1045   RDW 12.7 05/09/2020 1045   RDW 11.8 02/22/2020 0948   LYMPHSABS 1.5 05/09/2020 1045   LYMPHSABS 0.9 02/22/2020 0948   MONOABS 0.4 05/09/2020 1045   EOSABS 0.1 05/09/2020 1045   EOSABS 0.0 02/22/2020 0948   BASOSABS 0.0 05/09/2020 1045   BASOSABS 0.0 02/22/2020 0948   -------------------------------  Imaging from last 24 hours (if applicable):  Radiology interpretation: No results found.

## 2020-05-09 NOTE — Telephone Encounter (Signed)
Received voicemail this morning from patients wife describing patient symptoms of extreme nausea and chills, no fever.  Saw documented call to [facility] Cranker nurse who is bringing him in for evaluation so did not return call.

## 2020-05-09 NOTE — Patient Instructions (Signed)
Rehydration, Adult Rehydration is the replacement of body fluids, salts, and minerals (electrolytes) that are lost during dehydration. Dehydration is when there is not enough water or other fluids in the body. This happens when you lose more fluids than you take in. Common causes of dehydration include:  Not drinking enough fluids. This can occur when you are ill or doing activities that require a lot of energy, especially in hot weather.  Conditions that cause loss of water or other fluids, such as diarrhea, vomiting, sweating, or urinating a lot.  Other illnesses, such as fever or infection.  Certain medicines, such as those that remove excess fluid from the body (diuretics). Symptoms of mild or moderate dehydration may include thirst, dry lips and mouth, and dizziness. Symptoms of severe dehydration may include increased heart rate, confusion, fainting, and not urinating. For severe dehydration, you may need to get fluids through an IV at the hospital. For mild or moderate dehydration, you can usually rehydrate at home by drinking certain fluids as told by your health care provider. What are the risks? Generally, rehydration is safe. However, taking in too much fluid (overhydration) can be a problem. This is rare. Overhydration can cause an electrolyte imbalance, kidney failure, or a decrease in salt (sodium) levels in the body. Supplies needed You will need an oral rehydration solution (ORS) if your health care provider tells you to use one. This is a drink to treat dehydration. It can be found in pharmacies and retail stores. How to rehydrate Fluids Follow instructions from your health care provider for rehydration. The kind of fluid and the amount you should drink depend on your condition. In general, you should choose drinks that you prefer.  If told by your health care provider, drink an ORS. ? Make an ORS by following instructions on the package. ? Start by drinking small amounts,  about  cup (120 mL) every 5-10 minutes. ? Slowly increase how much you drink until you have taken the amount recommended by your health care provider.  Drink enough clear fluids to keep your urine pale yellow. If you were told to drink an ORS, finish it first, then start slowly drinking other clear fluids. Drink fluids such as: ? Water. This includes sparkling water and flavored water. Drinking only water can lead to having too little sodium in your body (hyponatremia). Follow the advice of your health care provider. ? Water from ice chips you suck on. ? Fruit juice with water you add to it (diluted). ? Sports drinks. ? Hot or cold herbal teas. ? Broth-based soups. ? Milk or milk products. Food Follow instructions from your health care provider about what to eat while you rehydrate. Your health care provider may recommend that you slowly begin eating regular foods in small amounts.  Eat foods that contain a healthy balance of electrolytes, such as bananas, oranges, potatoes, tomatoes, and spinach.  Avoid foods that are greasy or contain a lot of sugar. In some cases, you may get nutrition through a feeding tube that is passed through your nose and into your stomach (nasogastric tube, or NG tube). This may be done if you have uncontrolled vomiting or diarrhea.   Beverages to avoid Certain beverages may make dehydration worse. While you rehydrate, avoid drinking alcohol.   How to tell if you are recovering from dehydration You may be recovering from dehydration if:  You are urinating more often than before you started rehydrating.  Your urine is pale yellow.  Your energy level   improves.  You vomit less frequently.  You have diarrhea less frequently.  Your appetite improves or returns to normal.  You feel less dizzy or less light-headed.  Your skin tone and color start to look more normal. Follow these instructions at home:  Take over-the-counter and prescription medicines only  as told by your health care provider.  Do not take sodium tablets. Doing this can lead to having too much sodium in your body (hypernatremia). Contact a health care provider if:  You continue to have symptoms of mild or moderate dehydration, such as: ? Thirst. ? Dry lips. ? Slightly dry mouth. ? Dizziness. ? Dark urine or less urine than normal. ? Muscle cramps.  You continue to vomit or have diarrhea. Get help right away if you:  Have symptoms of dehydration that get worse.  Have a fever.  Have a severe headache.  Have been vomiting and the following happens: ? Your vomiting gets worse or does not go away. ? Your vomit includes blood or green matter (bile). ? You cannot eat or drink without vomiting.  Have problems with urination or bowel movements, such as: ? Diarrhea that gets worse or does not go away. ? Blood in your stool (feces). This may cause stool to look black and tarry. ? Not urinating, or urinating only a small amount of very dark urine, within 6-8 hours.  Have trouble breathing.  Have symptoms that get worse with treatment. These symptoms may represent a serious problem that is an emergency. Do not wait to see if the symptoms will go away. Get medical help right away. Call your local emergency services (911 in the [place] Do not drive yourself to the hospital. Summary  Rehydration is the replacement of body fluids and minerals (electrolytes) that are lost during dehydration.  Follow instructions from your health care provider for rehydration. The kind of fluid and amount you should drink depend on your condition.  Slowly increase how much you drink until you have taken the amount recommended by your health care provider.  Contact your health care provider if you continue to show signs of mild or moderate dehydration. This information is not intended to replace advice given to you by your health care provider. Make sure you discuss any questions you have with  your health care provider. Document Revised: 02/25/2019 Document Reviewed: 01/05/2019 Elsevier Patient Education  2021 Elsevier Inc.  

## 2020-05-10 ENCOUNTER — Telehealth: Payer: Self-pay | Admitting: Medical Oncology

## 2020-05-10 NOTE — Telephone Encounter (Signed)
Persistent  nausea and intractable vomiting since chemo. Contents are bilious and apprx 1/4 c. He cannot keep any liquids down. At 0400 he vomited and took  zofran and ativan at the same time. He vomited several times since then. (He did received 1 liter of IVF yesterday.)  Antiemetics Compazine ineffective. I   instructed wife to separate ativan and zofran and to take one or the other at 12 noon and the other in 2-4 hours for persistent N/V and offer ice chips. Per Lucianne Lei , I told wife pt can add in Decadron 4 mg bid for the next several days and  to keep appt tomorrow and if pt gets worse to go to ED.  Diarrhea started today .Instructions given for Imodium .

## 2020-05-11 ENCOUNTER — Other Ambulatory Visit: Payer: Self-pay | Admitting: Medical Oncology

## 2020-05-11 ENCOUNTER — Other Ambulatory Visit: Payer: Self-pay

## 2020-05-11 ENCOUNTER — Telehealth: Payer: Self-pay | Admitting: Medical Oncology

## 2020-05-11 ENCOUNTER — Inpatient Hospital Stay: Payer: 59

## 2020-05-11 ENCOUNTER — Inpatient Hospital Stay: Payer: 59 | Admitting: Internal Medicine

## 2020-05-11 VITALS — BP 134/91 | HR 63 | Temp 97.2°F | Resp 20 | Ht 71.0 in | Wt 270.3 lb

## 2020-05-11 DIAGNOSIS — E860 Dehydration: Secondary | ICD-10-CM | POA: Insufficient documentation

## 2020-05-11 DIAGNOSIS — R112 Nausea with vomiting, unspecified: Secondary | ICD-10-CM | POA: Insufficient documentation

## 2020-05-11 DIAGNOSIS — C3491 Malignant neoplasm of unspecified part of right bronchus or lung: Secondary | ICD-10-CM

## 2020-05-11 DIAGNOSIS — Z5111 Encounter for antineoplastic chemotherapy: Secondary | ICD-10-CM

## 2020-05-11 HISTORY — DX: Nausea with vomiting, unspecified: R11.2

## 2020-05-11 LAB — CBC WITH DIFFERENTIAL (CANCER CENTER ONLY)
Abs Immature Granulocytes: 0.03 10*3/uL (ref 0.00–0.07)
Basophils Absolute: 0 10*3/uL (ref 0.0–0.1)
Basophils Relative: 0 %
Eosinophils Absolute: 0 10*3/uL (ref 0.0–0.5)
Eosinophils Relative: 1 %
HCT: 46.3 % (ref 39.0–52.0)
Hemoglobin: 16.6 g/dL (ref 13.0–17.0)
Immature Granulocytes: 0 %
Lymphocytes Relative: 18 %
Lymphs Abs: 1.5 10*3/uL (ref 0.7–4.0)
MCH: 30.9 pg (ref 26.0–34.0)
MCHC: 35.9 g/dL (ref 30.0–36.0)
MCV: 86.1 fL (ref 80.0–100.0)
Monocytes Absolute: 0.3 10*3/uL (ref 0.1–1.0)
Monocytes Relative: 3 %
Neutro Abs: 6.4 10*3/uL (ref 1.7–7.7)
Neutrophils Relative %: 78 %
Platelet Count: 285 10*3/uL (ref 150–400)
RBC: 5.38 MIL/uL (ref 4.22–5.81)
RDW: 12.3 % (ref 11.5–15.5)
WBC Count: 8.3 10*3/uL (ref 4.0–10.5)
nRBC: 0 % (ref 0.0–0.2)

## 2020-05-11 LAB — CMP (CANCER CENTER ONLY)
ALT: 24 U/L (ref 0–44)
AST: 19 U/L (ref 15–41)
Albumin: 4.5 g/dL (ref 3.5–5.0)
Alkaline Phosphatase: 57 U/L (ref 38–126)
Anion gap: 13 (ref 5–15)
BUN: 26 mg/dL — ABNORMAL HIGH (ref 6–20)
CO2: 26 mmol/L (ref 22–32)
Calcium: 9.6 mg/dL (ref 8.9–10.3)
Chloride: 96 mmol/L — ABNORMAL LOW (ref 98–111)
Creatinine: 1.12 mg/dL (ref 0.61–1.24)
GFR, Estimated: >60 mL/min (ref >60)
Glucose, Bld: 109 mg/dL — ABNORMAL HIGH (ref 70–99)
Potassium: 3.8 mmol/L (ref 3.5–5.1)
Sodium: 135 mmol/L (ref 135–145)
Total Bilirubin: 1.7 mg/dL — ABNORMAL HIGH (ref 0.3–1.2)
Total Protein: 7.8 g/dL (ref 6.5–8.1)

## 2020-05-11 LAB — MAGNESIUM: Magnesium: 2 mg/dL (ref 1.7–2.4)

## 2020-05-11 MED ORDER — SODIUM CHLORIDE 0.9 % IV SOLN
INTRAVENOUS | Status: DC
  Filled 2020-05-11: qty 250

## 2020-05-11 MED ORDER — SODIUM CHLORIDE 0.9 % IV SOLN
INTRAVENOUS | Status: AC
Start: 2020-05-11 — End: ?
  Filled 2020-05-11 (×2): qty 250

## 2020-05-11 MED ORDER — SODIUM CHLORIDE 0.9 % IV SOLN: 8.0000 mg | Freq: Once | INTRAVENOUS | Status: DC

## 2020-05-11 MED ORDER — OLANZAPINE 10 MG PO TABS: 10.0000 mg | ORAL_TABLET | Freq: Every day | ORAL | 0 refills | Status: DC

## 2020-05-11 MED ORDER — ONDANSETRON HCL 4 MG/2ML IJ SOLN
8.0000 mg | Freq: Once | INTRAMUSCULAR | Status: AC
  Administered 2020-05-11: 8 mg via INTRAVENOUS

## 2020-05-11 MED ORDER — ONDANSETRON HCL 4 MG/2ML IJ SOLN
8.0000 mg | Freq: Once | INTRAMUSCULAR | Status: AC
Start: 2020-05-11 — End: ?

## 2020-05-11 MED ORDER — SODIUM CHLORIDE 0.9 % IV SOLN
INTRAVENOUS | Status: AC
  Filled 2020-05-11: qty 250

## 2020-05-11 MED ORDER — ONDANSETRON HCL 4 MG/2ML IJ SOLN
INTRAMUSCULAR | Status: AC
  Filled 2020-05-11: qty 4

## 2020-05-11 NOTE — Progress Notes (Signed)
Nausea /vomiting-zofran 8 mg given IV now per verbal order Dr Julien Nordmann.

## 2020-05-11 NOTE — Telephone Encounter (Signed)
Reviewed nausea medication schedule with wife.

## 2020-05-11 NOTE — Progress Notes (Unsigned)
0845Normal saline started due to nausea and vomiting.

## 2020-05-11 NOTE — Addendum Note (Signed)
Addended by: Ardeen Garland on: 05/11/2020 09:22 AM   Modules accepted: Orders

## 2020-05-11 NOTE — Addendum Note (Signed)
Addended by: Margaret Pyle on: 05/11/2020 09:20 AM   Modules accepted: Orders

## 2020-05-11 NOTE — Progress Notes (Signed)
[facility] [phone]   [phone]  OFFICE PROGRESS NOTE  [name], NP [place]. [address] [place] [address]  DIAGNOSIS: Stage IIA (T2b, N0, M0) non-small cell lung cancer, adenocarcinoma presented with right upper lobe lung mass diagnosed in February 2022.  Biomarker Findings Microsatellite status - MS-Stable Tumor Mutational Burden - 4 Muts/Mb Genomic Findings For a complete list of the genes assayed, please refer to the Appendix. KRAS G12D KEAP1 P278S 7 Disease relevant genes with no reportable alterations: ALK, BRAF, EGFR, ERBB2, MET, RET, ROS1  PDL1 Expression 5%.   PRIOR THERAPY: Status post robotic assisted right video thoracoscopy with right upper lobectomy and mediastinal lymph node sampling under the care of Dr. Kipp Brood on March 28, 2020  CURRENT THERAPY: Adjuvant systemic chemotherapy with cisplatin 75 Mg/M2 and Alimta 500 Mg/M2 every 3 weeks.  First dose May 05, 2020.  INTERVAL HISTORY: Jerry [name] 45 y.o. male returns to the clinic today for follow-up visit accompanied by his wife.  The patient lost around 20 pounds since the start of his treatment.  He has a lot of nausea and vomiting after the first cycle of his chemotherapy.  He has been tried on several medication including Decadron, Zofran, Compazine and Ativan with no improvement.  He feels a little bit better today.  He denied having any current chest pain, shortness of breath, cough or hemoptysis.  He has no fever or chills.  He denied having any headache or visual changes.  He is here today for evaluation and repeat blood work.  MEDICAL HISTORY: Past Medical History:  Diagnosis Date  . Cancer ([place])   . 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 Former smoker   . Pneumonia     ALLERGIES:  has No Known Allergies.  MEDICATIONS:  Current Outpatient Medications  Medication Sig Dispense Refill  . dexamethasone (DECADRON) 4 MG tablet 4 mg p.o. twice daily the  day before, day of and day after chemotherapy every 3 weeks 40 tablet 0  . folic acid (FOLVITE) 1 MG tablet Take 1 tablet (1 mg total) by mouth daily. 30 tablet 4  . LORazepam (ATIVAN) 0.5 MG tablet Place 1 tablet (0.5 mg total) under the tongue every 8 (eight) hours as needed (Nausea). 30 tablet 0  . nicotine polacrilex (NICORETTE) 4 MG gum Take 2 mg by mouth as needed for smoking cessation.    . ondansetron (ZOFRAN ODT) 8 MG disintegrating tablet Take 1 tablet (8 mg total) by mouth every 8 (eight) hours as needed for nausea or vomiting. Do not use until 3 days after chemotherapy 20 tablet 0  . prochlorperazine (COMPAZINE) 10 MG tablet Take 1 tablet (10 mg total) by mouth every 6 (six) hours as needed for nausea or vomiting. 30 tablet 0  . traMADol (ULTRAM) 50 MG tablet Take 1 tablet (50 mg total) by mouth every 6 (six) hours as needed (mild pain). 28 tablet 0   No current facility-administered medications for this visit.    SURGICAL HISTORY:  Past Surgical History:  Procedure Laterality Date  . INTERCOSTAL NERVE BLOCK Right 03/28/2020   Procedure: INTERCOSTAL NERVE BLOCK;  Surgeon: [name], MD;  Location: [facility];  Service: Thoracic;  Laterality: Right;  . LYMPH NODE DISSECTION Right 03/28/2020   Procedure: LYMPH NODE DISSECTION;  Surgeon: [name], MD;  Location: [facility];  Service: Thoracic;  Laterality: Right;  Marland Kitchen VIDEO BRONCHOSCOPY WITH ENDOBRONCHIAL NAVIGATION Right 03/02/2020  Procedure: VIDEO BRONCHOSCOPY WITH ENDOBRONCHIAL NAVIGATION;  Surgeon: Garner Nash, DO;  Location: [facility];  Service: Pulmonary;  Laterality: Right;  Marland Kitchen VIDEO BRONCHOSCOPY WITH ENDOBRONCHIAL ULTRASOUND Bilateral 03/02/2020   Procedure: VIDEO BRONCHOSCOPY WITH ENDOBRONCHIAL ULTRASOUND;  Surgeon: Garner Nash, DO;  Location: [facility];  Service: Pulmonary;  Laterality: Bilateral;  . WISDOM TOOTH EXTRACTION      REVIEW OF SYSTEMS:  Constitutional: positive for fatigue Eyes: negative Ears, nose,  mouth, throat, and face: negative Respiratory: negative Cardiovascular: negative Gastrointestinal: positive for nausea and vomiting Genitourinary:negative Integument/breast: negative Hematologic/lymphatic: negative Musculoskeletal:negative Neurological: negative Behavioral/Psych: negative Endocrine: negative Allergic/Immunologic: negative   PHYSICAL EXAMINATION: General appearance: alert, cooperative, fatigued and no distress Head: Normocephalic, without obvious abnormality, atraumatic Neck: no adenopathy, no JVD, supple, symmetrical, trachea midline and thyroid not enlarged, symmetric, no tenderness/mass/nodules Lymph nodes: Cervical, supraclavicular, and axillary nodes normal. Resp: clear to auscultation bilaterally Back: symmetric, no curvature. ROM normal. No CVA tenderness. Cardio: regular rate and rhythm, S1, S2 normal, no murmur, click, rub or gallop GI: soft, non-tender; bowel sounds normal; no masses,  no organomegaly Extremities: extremities normal, atraumatic, no cyanosis or edema Neurologic: Alert and oriented X 3, normal strength and tone. Normal symmetric reflexes. Normal coordination and gait  ECOG PERFORMANCE STATUS: 1 - Symptomatic but completely ambulatory  Blood pressure (!) 134/91, pulse 63, temperature (!) 97.2 F (36.2 C), temperature source Tympanic, resp. rate 20, height _0  (1.803 m), weight 270 lb 4.8 oz (122.6 kg), SpO2 98 %.  LABORATORY DATA: Lab Results  Component Value Date   WBC 8.3 05/11/2020   HGB 16.6 05/11/2020   HCT 46.3 05/11/2020   MCV 86.1 05/11/2020   PLT 285 05/11/2020      Chemistry      Component Value Date/Time   NA 139 05/09/2020 1045   NA 142 02/22/2020 0948   K 3.9 05/09/2020 1045   CL 101 05/09/2020 1045   CO2 29 05/09/2020 1045   BUN 16 05/09/2020 1045   BUN 11 02/22/2020 0948   CREATININE 0.88 05/09/2020 1045      Component Value Date/Time   CALCIUM 9.0 05/09/2020 1045   ALKPHOS 44 05/09/2020 1045   AST 15  05/09/2020 1045   ALT 16 05/09/2020 1045   BILITOT 0.6 05/09/2020 1045       RADIOGRAPHIC STUDIES: No results found.  ASSESSMENT AND PLAN: This is a very pleasant 45 years old white male recently diagnosed with a stage IIa (T2b, N0, M0) non-small cell lung cancer, adenocarcinoma status post right upper lobectomy with lymph node sampling under the care of Dr. Kipp Brood on March 28, 2020. The patient had molecular studies by foundation 1 that showed no actionable mutation and his PD-L1 expression was 5%. The patient started adjuvant systemic chemotherapy with cisplatin 75 Mg/M2 and Alimta 500 Mg/M2 last week.  He has a rough time with this treatment especially with persistent nausea and vomiting that is intractable and not responding to the current medication with Compazine, Zofran, Decadron and Ativan. He lost around 20 pounds since that time. I recommended for the patient to receive IV fluid today with Zofran intravenously. I recommended for him to discontinue his treatment with Compazine and I will start the patient on Zyprexa 10 mg p.o. nightly. I will monitor his renal function closely and if there is any deterioration of his renal function or persistent nausea and vomiting I may switch cisplatin to carboplatin starting from the next cycle of his treatment. He will come back for  follow-up visit in 2 weeks for evaluation before starting cycle #2. The patient was advised to call immediately if he has any other concerning symptoms in the interval. The patient voices understanding of current disease status and treatment options and is in agreement with the current care plan.  All questions were answered. The patient knows to call the clinic with any problems, questions or concerns. We can certainly see the patient much sooner if necessary.  The total time spent in the appointment was 30 minutes.  Disclaimer: This note was dictated with voice recognition software. Similar sounding words can  inadvertently be transcribed and may not be corrected upon review.

## 2020-05-12 ENCOUNTER — Other Ambulatory Visit: Payer: Self-pay | Admitting: Thoracic Surgery (Cardiothoracic Vascular Surgery)

## 2020-05-12 ENCOUNTER — Other Ambulatory Visit: Payer: 59

## 2020-05-12 DIAGNOSIS — C3491 Malignant neoplasm of unspecified part of right bronchus or lung: Secondary | ICD-10-CM

## 2020-05-13 ENCOUNTER — Ambulatory Visit (INDEPENDENT_AMBULATORY_CARE_PROVIDER_SITE_OTHER): Payer: Self-pay | Admitting: Physician Assistant

## 2020-05-13 ENCOUNTER — Other Ambulatory Visit: Payer: Self-pay

## 2020-05-13 ENCOUNTER — Ambulatory Visit
Admission: RE | Admit: 2020-05-13 | Discharge: 2020-05-13 | Disposition: A | Discharge status: Home / Self Care | Payer: 59 | Source: Ambulatory Visit | Attending: Thoracic Surgery (Cardiothoracic Vascular Surgery) | Admitting: Thoracic Surgery (Cardiothoracic Vascular Surgery)

## 2020-05-13 VITALS — BP 123/90 | HR 74 | Temp 98.7°F | Resp 20 | Ht 71.0 in | Wt 267.0 lb

## 2020-05-13 DIAGNOSIS — C3491 Malignant neoplasm of unspecified part of right bronchus or lung: Secondary | ICD-10-CM

## 2020-05-13 DIAGNOSIS — Z902 Acquired absence of lung [part of]: Secondary | ICD-10-CM

## 2020-05-13 NOTE — Progress Notes (Signed)
HPI:  Patient returns for routine postoperative follow-up having undergone Robotic Right Upper Lobectomy on 04/08/2020. The patient's early postoperative recovery while in the hospital was unremarkable without complications.  His case was discussed with tumor board and it was felt he would require adjuvant therapy. He reports today for 1 month follow up.  He states he had been doing well until the past week.  He states the therapy has been making him very sick with nausea and vomiting.  He states that yesterday was the first day he could eat anything and it was just a small amount of white rice.  His energy level in decreased as well.  He states being the treatment has made him feel so poorly he is unsure if he even wants to continue treatment.  He does also mention an area of swelling above his belly button.  He is unsure if this is fluid or a hernia.  Current Outpatient Medications  Medication Sig Dispense Refill  . dexamethasone (DECADRON) 4 MG tablet 4 mg p.o. twice daily the day before, day of and day after chemotherapy every 3 weeks 40 tablet 0  . folic acid (FOLVITE) 1 MG tablet Take 1 tablet (1 mg total) by mouth daily. 30 tablet 4  . LORazepam (ATIVAN) 0.5 MG tablet Place 1 tablet (0.5 mg total) under the tongue every 8 (eight) hours as needed (Nausea). 30 tablet 0  . nicotine polacrilex (NICORETTE) 4 MG gum Take 2 mg by mouth as needed for smoking cessation.    Marland Kitchen OLANZapine (ZYPREXA) 10 MG tablet Take 1 tablet (10 mg total) by mouth at bedtime. As needed for nausea. 20 tablet 0  . ondansetron (ZOFRAN ODT) 8 MG disintegrating tablet Take 1 tablet (8 mg total) by mouth every 8 (eight) hours as needed for nausea or vomiting. Do not use until 3 days after chemotherapy 20 tablet 0   No current facility-administered medications for this visit.   Facility-Administered Medications Ordered in Other Visits  Medication Dose Route Frequency Provider Last Rate Last Admin  . 0.9 %  sodium chloride  infusion   Intravenous Continuous Curt Bears, MD      . 0.9 %  sodium chloride infusion   Intravenous Continuous Curt Bears, MD 900 mL/hr at 05/11/20 0940 New Bag at 05/11/20 0940  . ondansetron (ZOFRAN) injection 8 mg  8 mg Intravenous Once Ardeen Garland, RN        Physical Exam:  BP 123/90   Pulse 74   Temp 98.7 F (37.1 C) (Oral)   Resp 20   Ht 5\' 11"  (1.803 m)   Wt 267 lb (121.1 kg)   SpO2 96% Comment: RA  BMI 37.24 kg/m   Gen: no apparent distress Heart: RRR Lungs: CTA Abdomen:soft, non-tender, non-distended.. no evidence of edema, hernia noticed Incisions: well healed  Diagnostic Tests:  CXR: no pneumothorax or pleural effusion present  A/P:  1. S/P Robotic Assisted RUL for adenocarcinoma, being treated with adjuvant therapy by Dr Earlie Server.  Overall has felt poorly during treatment to the point that he isn't sure he wants to continue with treatment.  CXR looks good w/o effusion 2. Abdominal edema- no evidence of hernia or edema present 3. RTC in 6 months.     Ellwood Handler, PA-C [facility] [phone]

## 2020-05-17 ENCOUNTER — Ambulatory Visit: Payer: 59 | Admitting: Pulmonary Disease

## 2020-05-18 ENCOUNTER — Other Ambulatory Visit: Payer: Self-pay

## 2020-05-18 ENCOUNTER — Inpatient Hospital Stay: Payer: 59

## 2020-05-18 DIAGNOSIS — C3491 Malignant neoplasm of unspecified part of right bronchus or lung: Secondary | ICD-10-CM

## 2020-05-18 DIAGNOSIS — Z5111 Encounter for antineoplastic chemotherapy: Secondary | ICD-10-CM | POA: Diagnosis not present

## 2020-05-18 LAB — CBC WITH DIFFERENTIAL (CANCER CENTER ONLY)
Abs Immature Granulocytes: 0.01 10*3/uL (ref 0.00–0.07)
Basophils Absolute: 0 10*3/uL (ref 0.0–0.1)
Basophils Relative: 0 %
Eosinophils Absolute: 0 10*3/uL (ref 0.0–0.5)
Eosinophils Relative: 1 %
HCT: 38.4 % — ABNORMAL LOW (ref 39.0–52.0)
Hemoglobin: 13.2 g/dL (ref 13.0–17.0)
Immature Granulocytes: 0 %
Lymphocytes Relative: 26 %
Lymphs Abs: 1.1 10*3/uL (ref 0.7–4.0)
MCH: 30.9 pg (ref 26.0–34.0)
MCHC: 34.4 g/dL (ref 30.0–36.0)
MCV: 89.9 fL (ref 80.0–100.0)
Monocytes Absolute: 0.6 10*3/uL (ref 0.1–1.0)
Monocytes Relative: 13 %
Neutro Abs: 2.5 10*3/uL (ref 1.7–7.7)
Neutrophils Relative %: 60 %
Platelet Count: 228 10*3/uL (ref 150–400)
RBC: 4.27 MIL/uL (ref 4.22–5.81)
RDW: 12.6 % (ref 11.5–15.5)
WBC Count: 4.2 10*3/uL (ref 4.0–10.5)
nRBC: 0 % (ref 0.0–0.2)

## 2020-05-18 LAB — MAGNESIUM: Magnesium: 1.6 mg/dL — ABNORMAL LOW (ref 1.7–2.4)

## 2020-05-18 LAB — CMP (CANCER CENTER ONLY)
ALT: 13 U/L (ref 0–44)
AST: 13 U/L — ABNORMAL LOW (ref 15–41)
Albumin: 3.6 g/dL (ref 3.5–5.0)
Alkaline Phosphatase: 55 U/L (ref 38–126)
Anion gap: 8 (ref 5–15)
BUN: 15 mg/dL (ref 6–20)
CO2: 27 mmol/L (ref 22–32)
Calcium: 9 mg/dL (ref 8.9–10.3)
Chloride: 106 mmol/L (ref 98–111)
Creatinine: 0.87 mg/dL (ref 0.61–1.24)
GFR, Estimated: >60 mL/min (ref >60)
Glucose, Bld: 103 mg/dL — ABNORMAL HIGH (ref 70–99)
Potassium: 4 mmol/L (ref 3.5–5.1)
Sodium: 141 mmol/L (ref 135–145)
Total Bilirubin: <0.2 mg/dL — ABNORMAL LOW (ref 0.3–1.2)
Total Protein: 6.2 g/dL — ABNORMAL LOW (ref 6.5–8.1)

## 2020-05-19 ENCOUNTER — Encounter: Payer: Self-pay | Admitting: Pulmonary Disease

## 2020-05-19 ENCOUNTER — Ambulatory Visit: Payer: 59 | Admitting: Pulmonary Disease

## 2020-05-19 VITALS — BP 126/70 | HR 72 | Temp 97.6°F | Ht 71.0 in | Wt 278.1 lb

## 2020-05-19 DIAGNOSIS — C3491 Malignant neoplasm of unspecified part of right bronchus or lung: Secondary | ICD-10-CM

## 2020-05-19 DIAGNOSIS — R918 Other nonspecific abnormal finding of lung field: Secondary | ICD-10-CM

## 2020-05-19 DIAGNOSIS — Z902 Acquired absence of lung [part of]: Secondary | ICD-10-CM | POA: Diagnosis not present

## 2020-05-19 DIAGNOSIS — R112 Nausea with vomiting, unspecified: Secondary | ICD-10-CM

## 2020-05-19 DIAGNOSIS — Z720 Tobacco use: Secondary | ICD-10-CM

## 2020-05-19 NOTE — Progress Notes (Signed)
Synopsis: Referred in February 2022 for abnormal CT chest, lung mass by [name], NP  Subjective:   PATIENT ID: Jerry [name] GENDER: male DOB: [birth year], MRN: [id number]  Chief Complaint  Patient presents with  . Follow-up    Follow up.  Had his first chemo done on April 29.  He stated that when he takes a deep breath in he has a wheezing at the end of it.  But he has been doing well.     45 year old gentleman, past medical history former smoker, switched to vaping use in 2015.  Patient had a chest x-ray which showed right upper lobe abnormality and patient underwent CT scan imaging completed at [facility].  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  Patient was referred to pulmonary for evaluation of the lung mass.  OV 02/29/2020: Patient has had no recent sick contacts.  Denies fevers.  No sputum production.  Denies hemoptysis but does have persistent cough that the wife says has been present for the past month.  Very anxious about recent findings found on CT scan.  He did state that the abnormality was found on chest x-ray from a routine screening that is done due to his work.  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  OV 05/19/2020: Here today for follow-up after surgical resection of 5 cm right upper lobe mass consistent with adenocarcinoma.  Patient is currently undergoing adjuvant chemotherapy.  He has completed his first cycle.  He does state that it made him feel very tired and nauseated last week but he is doing better.  From a respiratory standpoint no significant complaints today.   Past Medical History:  Diagnosis Date  . Cancer ([place])   . 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 Former smoker   . Pneumonia      Family History  Problem Relation Age of Onset  . Stroke Father   . Hypertension Father   . Hyperlipidemia Father   . Arrhythmia Father    . Lung cancer Paternal Aunt      Past Surgical History:  Procedure Laterality Date  . INTERCOSTAL NERVE BLOCK Right 03/28/2020   Procedure: INTERCOSTAL NERVE BLOCK;  Surgeon: [name], MD;  Location: [facility];  Service: Thoracic;  Laterality: Right;  . LYMPH NODE DISSECTION Right 03/28/2020   Procedure: LYMPH NODE DISSECTION;  Surgeon: [name], MD;  Location: [facility];  Service: Thoracic;  Laterality: Right;  Marland Kitchen VIDEO BRONCHOSCOPY WITH ENDOBRONCHIAL NAVIGATION Right 03/02/2020   Procedure: VIDEO BRONCHOSCOPY WITH ENDOBRONCHIAL NAVIGATION;  Surgeon: Garner Nash, DO;  Location: [facility];  Service: Pulmonary;  Laterality: Right;  Marland Kitchen VIDEO BRONCHOSCOPY WITH ENDOBRONCHIAL ULTRASOUND Bilateral 03/02/2020   Procedure: VIDEO BRONCHOSCOPY WITH ENDOBRONCHIAL ULTRASOUND;  Surgeon: Garner Nash, DO;  Location: [facility];  Service: Pulmonary;  Laterality: Bilateral;  . WISDOM TOOTH EXTRACTION      Social History   Socioeconomic History  . Marital status: Married    Spouse name: Not on file  . Number of children: Not on file  . Years of education: Not on file  . Highest education level: Not on file  Occupational History  . Occupation: Clinical biochemist  Tobacco Use  . Smoking status: Former Smoker    Packs/day: 1.00    Years: 17.00    Pack years: 17.00    Types: Cigarettes    Start  date: 03/01/1991    Quit date: 02/29/2008    Years since quitting: 12.2  . Smokeless tobacco: Never Used  . Tobacco comment: still using vape  Vaping Use  . Vaping Use: Every day  . Start date: 02/08/2013  . Last attempt to quit: 02/29/2020  . Substances: Nicotine  Substance and Sexual Activity  . 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  . Drug use: Never  . Sexual activity: Not on file  Other Topics Concern  . Not on file  Social History Narrative  . Not on file   Social Determinants of Health   Financial Resource Strain: Not on file  Food Insecurity: Not on file   Transportation Needs: Not on file  Physical Activity: Not on file  Stress: Not on file  Social Connections: Not on file  Intimate Partner Violence: Not on file     No Known Allergies   Outpatient Medications Prior to Visit  Medication Sig Dispense Refill  . dexamethasone (DECADRON) 4 MG tablet 4 mg p.o. twice daily the day before, day of and day after chemotherapy every 3 weeks 40 tablet 0  . folic acid (FOLVITE) 1 MG tablet Take 1 tablet (1 mg total) by mouth daily. 30 tablet 4  . LORazepam (ATIVAN) 0.5 MG tablet Place 1 tablet (0.5 mg total) under the tongue every 8 (eight) hours as needed (Nausea). 30 tablet 0  . OLANZapine (ZYPREXA) 10 MG tablet Take 1 tablet (10 mg total) by mouth at bedtime. As needed for nausea. 20 tablet 0  . ondansetron (ZOFRAN ODT) 8 MG disintegrating tablet Take 1 tablet (8 mg total) by mouth every 8 (eight) hours as needed for nausea or vomiting. Do not use until 3 days after chemotherapy 20 tablet 0  . nicotine polacrilex (NICORETTE) 4 MG gum Take 2 mg by mouth as needed for smoking cessation. (Patient not taking: Reported on 05/19/2020)     Facility-Administered Medications Prior to Visit  Medication Dose Route Frequency Provider Last Rate Last Admin  . 0.9 %  sodium chloride infusion   Intravenous Continuous Curt Bears, MD      . 0.9 %  sodium chloride infusion   Intravenous Continuous Curt Bears, MD 900 mL/hr at 05/11/20 0940 New Bag at 05/11/20 0940  . ondansetron (ZOFRAN) injection 8 mg  8 mg Intravenous Once Ardeen Garland, RN        Review of Systems  Constitutional: Negative for chills, fever, malaise/fatigue and weight loss.  HENT: Negative for hearing loss, sore throat and tinnitus.   Eyes: Negative for blurred vision and double vision.  Respiratory: Negative for cough, hemoptysis, sputum production, shortness of breath, wheezing and stridor.   Cardiovascular: Negative for chest pain, palpitations, orthopnea, leg swelling and PND.   Gastrointestinal: Positive for nausea. Negative for abdominal pain, constipation, diarrhea, heartburn and vomiting.  Genitourinary: Negative for dysuria, hematuria and urgency.  Musculoskeletal: Negative for joint pain and myalgias.  Skin: Negative for itching and rash.  Neurological: Negative for dizziness, tingling, weakness and headaches.  Endo/Heme/Allergies: Negative for environmental allergies. Does not bruise/bleed easily.  Psychiatric/Behavioral: Negative for depression. The patient is not nervous/anxious and does not have insomnia.   All other systems reviewed and are negative.    Objective:  Physical Exam Vitals reviewed.  Constitutional:      General: He is not in acute distress.    Appearance: He is well-developed. He is obese.  HENT:     Head: Normocephalic and atraumatic.  Eyes:     General: No scleral icterus.    Conjunctiva/sclera: Conjunctivae normal.     Pupils: Pupils are equal, round, and reactive to light.  Neck:     Vascular: No JVD.     Trachea: No tracheal deviation.  Cardiovascular:     Rate and Rhythm: Normal rate and regular rhythm.     Heart sounds: Normal heart sounds. No murmur heard.   Pulmonary:     Effort: Pulmonary effort is normal. No tachypnea, accessory muscle usage or respiratory distress.     Breath sounds: Normal breath sounds. No stridor. No wheezing, rhonchi or rales.  Abdominal:     General: Bowel sounds are normal. There is no distension.     Palpations: Abdomen is soft.     Tenderness: There is no abdominal tenderness.  Musculoskeletal:        General: No tenderness.     Cervical back: Neck supple.  Lymphadenopathy:     Cervical: No cervical adenopathy.  Skin:    General: Skin is warm and dry.     Capillary Refill: Capillary refill takes less than 2 seconds.     Findings: No rash.  Neurological:     Mental Status: He is alert and oriented to person, place, and time.  Psychiatric:        Behavior: Behavior normal.       Vitals:   05/19/20 1604  BP: 126/70  Pulse: 72  Temp: 97.6 F (36.4 C)  TempSrc: Tympanic  SpO2: 97%  Weight: 278 lb 2 oz (126.2 kg)  Height: 5\' 11"  (1.803 m)   97% on RA BMI Readings from Last 3 Encounters:  05/19/20 38.79 kg/m  05/13/20 37.24 kg/m  05/11/20 37.70 kg/m   Wt Readings from Last 3 Encounters:  05/19/20 278 lb 2 oz (126.2 kg)  05/13/20 267 lb (121.1 kg)  05/11/20 270 lb 4.8 oz (122.6 kg)     CBC    Component Value Date/Time   WBC 4.2 05/18/2020 1108   WBC 7.5 03/30/2020 0052   RBC 4.27 05/18/2020 1108   HGB 13.2 05/18/2020 1108   HGB 14.8 02/22/2020 0948   HCT 38.4 (L) 05/18/2020 1108   HCT 43.3 02/22/2020 0948   PLT 228 05/18/2020 1108   PLT 220 02/22/2020 0948   MCV 89.9 05/18/2020 1108   MCV 90 02/22/2020 0948   MCH 30.9 05/18/2020 1108   MCHC 34.4 05/18/2020 1108   RDW 12.6 05/18/2020 1108   RDW 11.8 02/22/2020 0948   LYMPHSABS 1.1 05/18/2020 1108   LYMPHSABS 0.9 02/22/2020 0948   MONOABS 0.6 05/18/2020 1108   EOSABS 0.0 05/18/2020 1108   EOSABS 0.0 02/22/2020 0948   BASOSABS 0.0 05/18/2020 1108   BASOSABS 0.0 02/22/2020 0948    Chest Imaging:  CT chest 02/23/2020: Right upper lobe lung mass 5 x 3 cm concerning for primary bronchogenic carcinoma. The patient's images have been independently reviewed by me.      Pulmonary Functions Testing Results: PFT Results Latest Ref Rng & Units 03/16/2020  FVC-Pre L 5.41  FVC-Predicted Pre % 96  FVC-Post L 5.48  FVC-Predicted Post % 98  Pre FEV1/FVC % % 70  Post FEV1/FCV % % 78  FEV1-Pre L 3.81  FEV1-Predicted Pre % 86  FEV1-Post L 4.30  DLCO uncorrected ml/min/mmHg 37.28  DLCO UNC% % 114  DLCO corrected ml/min/mmHg 37.49  DLCO COR %Predicted % 114  DLVA Predicted % 111  TLC L 7.86  TLC % Predicted % 107  RV % Predicted % 105    FeNO:   Pathology:   Echocardiogram:   Heart Catheterization:     Assessment & Plan:      ICD-10-CM   1. Adenocarcinoma of right lung,  stage 2 (HCC)  C34.91   2. Mass of upper lobe of right lung  R91.8   3. Intractable nausea and vomiting  R11.2   4. S/P lobectomy of lung  Z90.2   5. Tobacco abuse  Z72.0     Discussion:  This is a 45 year old gentleman, history of tobacco abuse, abnormal CT imaging consistent with a 5 cm right upper lobe mass taken for bronchoscopy diagnosed with adenocarcinoma of the lung status post resection for a stage II adenocarcinoma currently undergoing adjuvant chemotherapy no nodes positive.  Plan: Here today for follow-up post hospitalization and recovery from surgery.  Also starting chemotherapy.  Completed 1 cycle. From a respiratory standpoint doing well no intervention needed at this time. Patient to let us know if anything changes or if we can help in any way if he develops any other symptoms. No did not follow-up with [place] anytime soon as he has multiple doctors visits planned. We reviewed his course so far and answered any questions that he had regarding next steps.    Current Outpatient Medications:  .  dexamethasone (DECADRON) 4 MG tablet, 4 mg p.o. twice daily the day before, day of and day after chemotherapy every 3 weeks, Disp: 40 tablet, Rfl: 0 .  folic acid (FOLVITE) 1 MG tablet, Take 1 tablet (1 mg total) by mouth daily., Disp: 30 tablet, Rfl: 4 .  LORazepam (ATIVAN) 0.5 MG tablet, Place 1 tablet (0.5 mg total) under the tongue every 8 (eight) hours as needed (Nausea)., Disp: 30 tablet, Rfl: 0 .  OLANZapine (ZYPREXA) 10 MG tablet, Take 1 tablet (10 mg total) by mouth at bedtime. As needed for nausea., Disp: 20 tablet, Rfl: 0 .  ondansetron (ZOFRAN ODT) 8 MG disintegrating tablet, Take 1 tablet (8 mg total) by mouth every 8 (eight) hours as needed for nausea or vomiting. Do not use until 3 days after chemotherapy, Disp: 20 tablet, Rfl: 0 No current facility-administered medications for this visit.  Facility-Administered Medications Ordered in Other Visits:  .  0.9 %  sodium  chloride infusion, , Intravenous, Continuous, Mohamed, Mohamed, MD .  0.9 %  sodium chloride infusion, , Intravenous, Continuous, Curt Bears, MD, Last Rate: 900 mL/hr at 05/11/20 0940, New Bag at 05/11/20 0940 .  ondansetron (ZOFRAN) injection 8 mg, 8 mg, Intravenous, Once, Bell, Ellard Artis, RN    Garner Nash, DO [facility] 05/19/2020 4:42 PM

## 2020-05-19 NOTE — Patient Instructions (Signed)
Thank you for visiting Dr. Valeta Harms at [facility]. Today we recommend the following:  Call me if anything changes.   Return in about 6 months (around 11/19/2020), or if symptoms worsen or fail to improve, for w/ Dr. Valeta Harms .    Please do your part to reduce the spread of COVID-19.

## 2020-05-23 ENCOUNTER — Ambulatory Visit: Payer: 59 | Admitting: Nurse Practitioner

## 2020-05-23 ENCOUNTER — Encounter: Payer: Self-pay | Admitting: Nurse Practitioner

## 2020-05-23 ENCOUNTER — Other Ambulatory Visit: Payer: Self-pay

## 2020-05-23 VITALS — BP 120/70 | HR 98 | Temp 97.4°F | Resp 16 | Ht 71.0 in | Wt 275.0 lb

## 2020-05-23 DIAGNOSIS — R112 Nausea with vomiting, unspecified: Secondary | ICD-10-CM

## 2020-05-23 DIAGNOSIS — K439 Ventral hernia without obstruction or gangrene: Secondary | ICD-10-CM | POA: Diagnosis not present

## 2020-05-23 DIAGNOSIS — C3491 Malignant neoplasm of unspecified part of right bronchus or lung: Secondary | ICD-10-CM

## 2020-05-23 DIAGNOSIS — Z902 Acquired absence of lung [part of]: Secondary | ICD-10-CM | POA: Diagnosis not present

## 2020-05-23 MED ORDER — PROMETHAZINE HCL 25 MG PO TABS: 25.0000 mg | ORAL_TABLET | Freq: Three times a day (TID) | ORAL | 0 refills | Status: DC | PRN

## 2020-05-23 NOTE — Progress Notes (Signed)
Subjective:  Patient ID: Jerry [name], male    DOB: [birth year]  Age: 45 y.o. MRN: [id number]  Chief Complaint  Patient presents with  . 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 He is currently undergoing chemotherapy treatments. States he had severe intractable vomiting.He was given IVF for dehydration.   Jerry [name] tells me that he has developed a tender, palpable "bulge" to right upper abdomen. He tells me the "bulge" appears when he pushed his abdomen out but is not visible at all times. States it has been present for some time. States he had previous jobs that required heavy lifting.   Jerry [name] tells me that he has stopped vaping nicotine. He was using nicotine gum to wean himself but was unable to chew it due to intractable vomiting.   Current Outpatient Medications on File Prior to Visit  Medication Sig Dispense Refill  . dexamethasone (DECADRON) 4 MG tablet 4 mg p.o. twice daily the day before, day of and day after chemotherapy every 3 weeks 40 tablet 0  . folic acid (FOLVITE) 1 MG tablet Take 1 tablet (1 mg total) by mouth daily. 30 tablet 4  . LORazepam (ATIVAN) 0.5 MG tablet Place 1 tablet (0.5 mg total) under the tongue every 8 (eight) hours as needed (Nausea). 30 tablet 0  . ondansetron (ZOFRAN ODT) 8 MG disintegrating tablet Take 1 tablet (8 mg total) by mouth every 8 (eight) hours as needed for nausea or vomiting. Do not use until 3 days after chemotherapy 20 tablet 0   Current Facility-Administered Medications on File Prior to Visit  Medication Dose Route Frequency Provider Last Rate Last Admin  . 0.9 %  sodium chloride infusion   Intravenous Continuous Curt Bears, MD      . 0.9 %  sodium chloride infusion   Intravenous Continuous Curt Bears, MD 900 mL/hr at 05/11/20 0940 New Bag at 05/11/20 0940  . ondansetron (ZOFRAN) injection 8 mg  8 mg Intravenous Once  Ardeen Garland, RN       Past Medical History:  Diagnosis Date  . Cancer ([place])   . 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 Former smoker   . Pneumonia    Past Surgical History:  Procedure Laterality Date  . INTERCOSTAL NERVE BLOCK Right 03/28/2020   Procedure: INTERCOSTAL NERVE BLOCK;  Surgeon: [name], MD;  Location: [facility];  Service: Thoracic;  Laterality: Right;  . LUNG LOBECTOMY Right 03/30/2020  . LYMPH NODE DISSECTION Right 03/28/2020   Procedure: LYMPH NODE DISSECTION;  Surgeon: [name], MD;  Location: [facility];  Service: Thoracic;  Laterality: Right;  Marland Kitchen VIDEO BRONCHOSCOPY WITH ENDOBRONCHIAL NAVIGATION Right 03/02/2020   Procedure: VIDEO BRONCHOSCOPY WITH ENDOBRONCHIAL NAVIGATION;  Surgeon: Garner Nash, DO;  Location: [facility];  Service: Pulmonary;  Laterality: Right;  Marland Kitchen VIDEO BRONCHOSCOPY WITH ENDOBRONCHIAL ULTRASOUND Bilateral 03/02/2020   Procedure: VIDEO BRONCHOSCOPY WITH ENDOBRONCHIAL ULTRASOUND;  Surgeon: Garner Nash, DO;  Location: [facility];  Service: Pulmonary;  Laterality: Bilateral;  . WISDOM TOOTH EXTRACTION      Family History  Problem Relation Age of Onset  . Stroke Father   . Hypertension Father   . Hyperlipidemia Father   . Arrhythmia Father   . Lung cancer Paternal Aunt    Social History   Socioeconomic History  . Marital status: Married    Spouse name: Not on file  .  Number of children: 6  . Years of education: Not on file  . Highest education level: Not on file  Occupational History  . Occupation: Clinical biochemist  Tobacco Use  . Smoking status: Former Smoker    Packs/day: 1.00    Years: 17.00    Pack years: 17.00    Types: Cigarettes    Start date: 03/01/1991    Quit date: 02/29/2008    Years since quitting: 12.2  . Smokeless tobacco: Never Used  . Tobacco comment: still using vape  Vaping Use  . Vaping Use: Former  . Start date: 02/08/2013  . Quit date: 02/29/2020  . Substances: Nicotine  Substance and Sexual Activity  . 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  . Drug use: Never  . Sexual activity: Yes    Partners: Female  Other Topics Concern  . Not on file  Social History Narrative  . Not on file   Social Determinants of Health   Financial Resource Strain: Not on file  Food Insecurity: Not on file  Transportation Needs: Not on file  Physical Activity: Not on file  Stress: Not on file  Social Connections: Not on file    Review of Systems  Constitutional: Negative for appetite change, chills, fatigue, fever and unexpected weight change.  HENT: Negative for congestion, ear pain, rhinorrhea, sinus pressure, sinus pain, sore throat and tinnitus.   Eyes: Negative for pain.  Respiratory: Negative for cough and shortness of breath.   Cardiovascular: Negative for chest pain, palpitations and leg swelling.  Gastrointestinal: Positive for abdominal pain (right abdomen), nausea and vomiting. Negative for blood in stool, constipation and diarrhea.       Due to the chemotherapy.  Endocrine: Negative for cold intolerance, heat intolerance, polydipsia, polyphagia and polyuria.  Genitourinary: Negative for dysuria, frequency and hematuria.  Musculoskeletal: Negative for arthralgias, back pain, joint swelling and myalgias.  Skin: Negative for rash.  Allergic/Immunologic: Negative for environmental allergies.  Neurological: Negative for dizziness, weakness and headaches.  Hematological: Negative for adenopathy.  Psychiatric/Behavioral: Negative for decreased concentration and sleep disturbance. The patient is not nervous/anxious.      Objective:  BP 120/70   Pulse 98   Temp (!) 97.4 F (36.3 C)   Resp 16   Ht 5\' 11"  (1.803 m)   Wt 275 lb (124.7 kg)   SpO2 97%   BMI 38.35 kg/m   BP/Weight 05/23/2020 8/65/7846 09/14/2950  Systolic BP 841 324 401  Diastolic BP 70 70 90  Wt. (Lbs) 275 278.13 267  BMI 38.35 38.79 37.24    Physical Exam Vitals reviewed.  Constitutional:      Appearance: Normal  appearance.  HENT:     Head: Normocephalic.     Mouth/Throat:     Mouth: Mucous membranes are moist.  Cardiovascular:     Rate and Rhythm: Normal rate and regular rhythm.     Pulses: Normal pulses.     Heart sounds: Normal heart sounds.  Pulmonary:     Effort: Pulmonary effort is normal.     Breath sounds: Normal breath sounds.  Abdominal:     General: Bowel sounds are normal.     Palpations: Abdomen is soft.     Hernia: A hernia (right abdomen, ventral hernia) is present.  Musculoskeletal:        General: Normal range of motion.  Skin:    General: Skin is warm and dry.     Capillary Refill: Capillary refill takes less than  2 seconds.  Neurological:     General: No focal deficit present.     Mental Status: He is alert and oriented to person, place, and time.  Psychiatric:        Mood and Affect: Mood normal.        Behavior: Behavior normal.   BP 120/70   Pulse 98   Temp (!) 97.4 F (36.3 C)   Resp 16   Ht 5\' 11"  (1.803 m)   Wt 275 lb (124.7 kg)   SpO2 97%   BMI 38.35 kg/m       Lab Results  Component Value Date   WBC 4.2 05/18/2020   HGB 13.2 05/18/2020   HCT 38.4 (L) 05/18/2020   PLT 228 05/18/2020   GLUCOSE 103 (H) 05/18/2020   CHOL 192 02/22/2020   TRIG 81 02/22/2020   HDL 44 02/22/2020   LDLCALC 133 (H) 02/22/2020   ALT 13 05/18/2020   AST 13 (L) 05/18/2020   NA 141 05/18/2020   K 4.0 05/18/2020   CL 106 05/18/2020   CREATININE 0.87 05/18/2020   BUN 15 05/18/2020   CO2 27 05/18/2020   TSH 0.509 02/22/2020   INR 1.0 03/24/2020      Assessment & Plan:   1. S/P lobectomy of lung -Continue chemotherapy as scheduled -Follow-up with Dr Julien Nordmann as scheduled  2. Ventral hernia without obstruction or gangrene -Avoid heavy lifting or straining -Will refer to general surgeon after chemotherapy completed  3. Nausea and vomiting in adult patient - promethazine (PHENERGAN) 25 MG tablet; Take 1 tablet (25 mg total) by mouth every 8 (eight) hours as  needed for nausea or vomiting.  Dispense: 20 tablet; Refill: 0 -Continue Zofran as directed -Push fluids  4. Adenocarcinoma of right lung, stage 2 (HCC) -Continue chemotherapy as directed   Follow-up with chemotherapy as scheduled Take nausea medications as needed We will consult general surgeon concerning ventral hernia after chemotherapy Follow-up in 50-months or sooner as needed  I,Marla I Leal-Borjas,acting as a scribe for [name], NP.,have documented all relevant documentation on the behalf of [name], NP,as directed by  [name], NP while in the presence of [name], NP.   I, [name], NP, have reviewed all documentation for this visit. The documentation on 05/23/20 for the exam, diagnosis, procedures, and orders are all accurate and complete.  Follow-up: 51-months  An After Visit Summary was printed and given to the patient.  [name], NP [place] [phone]

## 2020-05-23 NOTE — Patient Instructions (Signed)
Follow-up with chemotherapy as scheduled Take nausea medications as needed We will consult general surgeon concerning ventral hernia after chemotherapy Follow-up in 63-months or sooner as needed  Ventral Hernia  A ventral hernia is a bulge of tissue from inside the abdomen that pushes through a weak area of the muscles that form the front wall of the abdomen. The tissues inside the abdomen are inside a sac (peritoneum). These tissues include the small intestine, large intestine, and the fatty tissue that covers the intestines (omentum). Sometimes, the bulge that forms a hernia contains intestines. Other hernias contain only fat. Ventral hernias do not go away without surgical treatment. There are several types of ventral hernias. You may have:  A hernia at an incision site from previous abdominal surgery (incisional hernia).  A hernia just above the belly button (epigastric hernia), or at the belly button (umbilical hernia). These types of hernias can develop from heavy lifting or straining.  A hernia that comes and goes (reducible hernia). It may be visible only when you lift or strain. This type of hernia can be pushed back into the abdomen (reduced).  A hernia that traps abdominal tissue inside the hernia (incarcerated hernia). This type of hernia does not reduce.  A hernia that cuts off blood flow to the tissues inside the hernia (strangulated hernia). The tissues can start to die if this happens. This is a very painful bulge that cannot be reduced. A strangulated hernia is a medical emergency. What are the causes? This condition is caused by abdominal tissue putting pressure on an area of weakness in the abdominal muscles. What increases the risk? The following factors may make you more likely to develop this condition:  Being age 56 or older.  Being overweight or obese.  Having had previous abdominal surgery, especially if there was an infection after surgery.  Having had an injury  to the abdominal wall.  Frequently lifting or pushing heavy objects.  Having had several pregnancies.  Having a buildup of fluid inside the abdomen (ascites).  Straining to have a bowel movement or to urinate.  Having frequent coughing episodes. What are the signs or symptoms? The only symptom of a ventral hernia may be a painless bulge in the abdomen. A reducible hernia may be visible only when you strain, cough, or lift. Other symptoms may include:  Dull pain.  A feeling of pressure. Signs and symptoms of a strangulated hernia may include:  Increasing pain.  Nausea and vomiting.  Pain when pressing on the hernia.  The skin over the hernia turning red or purple.  Constipation.  Blood in the stool (feces). How is this diagnosed? This condition may be diagnosed based on:  Your symptoms.  Your medical history.  A physical exam. You may be asked to cough or strain while standing. These actions increase the pressure inside your abdomen and force the hernia through the opening in your muscles. Your health care provider may try to reduce the hernia by gently pushing the hernia back in.  Imaging studies, such as an ultrasound or CT scan. How is this treated? This condition is treated with surgery. If you have a strangulated hernia, surgery is done as soon as possible. If your hernia is small and not incarcerated, you may be asked to lose some weight before surgery. Follow these instructions at home:  Follow instructions from your health care provider about eating or drinking restrictions.  If you are overweight, your health care provider may recommend that you increase  your activity level and eat a healthier diet.  Do not lift anything that is heavier than 10 lb (4.5 kg), or the limit that you are told, until your health care provider says that it is safe.  Return to your normal activities as told by your health care provider. Ask your health care provider what activities  are safe for you. You may need to avoid activities that increase pressure on your hernia.  Take over-the-counter and prescription medicines only as told by your health care provider.  Keep all follow-up visits. This is important. Contact a health care provider if:  Your hernia gets larger.  Your hernia becomes painful. Get help right away if:  Your hernia becomes increasingly painful.  You have pain along with any of the following: ? Changes in skin color in the area of the hernia. ? Nausea. ? Vomiting. ? Fever. These symptoms may represent a serious problem that is an emergency. Do not wait to see if the symptoms will go away. Get medical help right away. Call your local emergency services (911 in the [place] Do not drive yourself to the hospital. Summary  A ventral hernia is a bulge of tissue from inside the abdomen that pushes through a weak area of the muscles that form the front wall of the abdomen.  This condition is treated with surgery, which may be urgent depending on your hernia.  Do not lift anything that is heavier than 10 lb (4.5 kg), and follow activity instructions from your health care provider. This information is not intended to replace advice given to you by your health care provider. Make sure you discuss any questions you have with your health care provider. Document Revised: 08/14/2019 Document Reviewed: 08/14/2019 Elsevier Patient Education  2021 [employer].

## 2020-05-24 ENCOUNTER — Encounter: Payer: Self-pay | Admitting: Nurse Practitioner

## 2020-05-26 ENCOUNTER — Other Ambulatory Visit: Payer: Self-pay | Admitting: Medical Oncology

## 2020-05-26 ENCOUNTER — Encounter: Payer: Self-pay | Admitting: Internal Medicine

## 2020-05-26 ENCOUNTER — Inpatient Hospital Stay: Payer: 59 | Admitting: Internal Medicine

## 2020-05-26 ENCOUNTER — Other Ambulatory Visit: Payer: Self-pay

## 2020-05-26 ENCOUNTER — Inpatient Hospital Stay: Payer: 59 | Admitting: Nutrition

## 2020-05-26 ENCOUNTER — Inpatient Hospital Stay: Payer: 59

## 2020-05-26 ENCOUNTER — Other Ambulatory Visit: Payer: Self-pay | Admitting: Internal Medicine

## 2020-05-26 VITALS — BP 139/79 | HR 71 | Temp 96.8°F | Resp 19 | Ht 71.0 in | Wt 285.1 lb

## 2020-05-26 DIAGNOSIS — R112 Nausea with vomiting, unspecified: Secondary | ICD-10-CM

## 2020-05-26 DIAGNOSIS — C3491 Malignant neoplasm of unspecified part of right bronchus or lung: Secondary | ICD-10-CM

## 2020-05-26 DIAGNOSIS — Z5111 Encounter for antineoplastic chemotherapy: Secondary | ICD-10-CM | POA: Diagnosis not present

## 2020-05-26 LAB — CBC WITH DIFFERENTIAL (CANCER CENTER ONLY)
Abs Immature Granulocytes: 0.23 10*3/uL — ABNORMAL HIGH (ref 0.00–0.07)
Basophils Absolute: 0 10*3/uL (ref 0.0–0.1)
Basophils Relative: 0 %
Eosinophils Absolute: 0 10*3/uL (ref 0.0–0.5)
Eosinophils Relative: 0 %
HCT: 40.3 % (ref 39.0–52.0)
Hemoglobin: 13.7 g/dL (ref 13.0–17.0)
Immature Granulocytes: 3 %
Lymphocytes Relative: 12 %
Lymphs Abs: 1.1 10*3/uL (ref 0.7–4.0)
MCH: 30.8 pg (ref 26.0–34.0)
MCHC: 34 g/dL (ref 30.0–36.0)
MCV: 90.6 fL (ref 80.0–100.0)
Monocytes Absolute: 1.2 10*3/uL — ABNORMAL HIGH (ref 0.1–1.0)
Monocytes Relative: 13 %
Neutro Abs: 6.5 10*3/uL (ref 1.7–7.7)
Neutrophils Relative %: 72 %
Platelet Count: 403 10*3/uL — ABNORMAL HIGH (ref 150–400)
RBC: 4.45 MIL/uL (ref 4.22–5.81)
RDW: 12.8 % (ref 11.5–15.5)
WBC Count: 9.1 10*3/uL (ref 4.0–10.5)
nRBC: 0 % (ref 0.0–0.2)

## 2020-05-26 LAB — CMP (CANCER CENTER ONLY)
ALT: 15 U/L (ref 0–44)
AST: 12 U/L — ABNORMAL LOW (ref 15–41)
Albumin: 3.9 g/dL (ref 3.5–5.0)
Alkaline Phosphatase: 51 U/L (ref 38–126)
Anion gap: 12 (ref 5–15)
BUN: 15 mg/dL (ref 6–20)
CO2: 25 mmol/L (ref 22–32)
Calcium: 9.2 mg/dL (ref 8.9–10.3)
Chloride: 104 mmol/L (ref 98–111)
Creatinine: 0.79 mg/dL (ref 0.61–1.24)
GFR, Estimated: >60 mL/min (ref >60)
Glucose, Bld: 152 mg/dL — ABNORMAL HIGH (ref 70–99)
Potassium: 4 mmol/L (ref 3.5–5.1)
Sodium: 141 mmol/L (ref 135–145)
Total Bilirubin: <0.2 mg/dL — ABNORMAL LOW (ref 0.3–1.2)
Total Protein: 7 g/dL (ref 6.5–8.1)

## 2020-05-26 LAB — MAGNESIUM: Magnesium: 1.7 mg/dL (ref 1.7–2.4)

## 2020-05-26 MED ORDER — MAGNESIUM SULFATE 2 GM/50ML IV SOLN
2.0000 g | Freq: Once | INTRAVENOUS | Status: AC
  Administered 2020-05-26: 2 g via INTRAVENOUS

## 2020-05-26 MED ORDER — SODIUM CHLORIDE 0.9 % IV SOLN
Freq: Once | INTRAVENOUS | Status: AC
  Filled 2020-05-26: qty 250

## 2020-05-26 MED ORDER — SODIUM CHLORIDE 0.9 % IV SOLN
500.0000 mg/m2 | Freq: Once | INTRAVENOUS | Status: AC
  Administered 2020-05-26: 1300 mg via INTRAVENOUS
  Filled 2020-05-26: qty 40

## 2020-05-26 MED ORDER — SODIUM CHLORIDE 0.9 % IV SOLN
75.0000 mg/m2 | Freq: Once | INTRAVENOUS | Status: AC
  Administered 2020-05-26: 192 mg via INTRAVENOUS
  Filled 2020-05-26: qty 192

## 2020-05-26 MED ORDER — SODIUM CHLORIDE 0.9 % IV SOLN
10.0000 mg | Freq: Once | INTRAVENOUS | Status: AC
  Administered 2020-05-26: 10 mg via INTRAVENOUS
  Filled 2020-05-26: qty 10

## 2020-05-26 MED ORDER — SODIUM CHLORIDE 0.9 % IV SOLN
150.0000 mg | Freq: Once | INTRAVENOUS | Status: AC
  Administered 2020-05-26: 150 mg via INTRAVENOUS
  Filled 2020-05-26: qty 150

## 2020-05-26 MED ORDER — ALUM & MAG HYDROXIDE-SIMETH 200-200-20 MG/5ML PO SUSP
ORAL | Status: AC
  Filled 2020-05-26: qty 30

## 2020-05-26 MED ORDER — HEPARIN SOD (PORK) LOCK FLUSH 100 UNIT/ML IV SOLN
500.0000 [IU] | Freq: Once | INTRAVENOUS | Status: DC | PRN
  Filled 2020-05-26: qty 5

## 2020-05-26 MED ORDER — MAGNESIUM SULFATE 2 GM/50ML IV SOLN
INTRAVENOUS | Status: AC
  Filled 2020-05-26: qty 50

## 2020-05-26 MED ORDER — SODIUM CHLORIDE 0.9% FLUSH
10.0000 mL | INTRAVENOUS | Status: DC | PRN
  Filled 2020-05-26: qty 10

## 2020-05-26 MED ORDER — PALONOSETRON HCL INJECTION 0.25 MG/5ML
INTRAVENOUS | Status: AC
  Filled 2020-05-26: qty 5

## 2020-05-26 MED ORDER — PALONOSETRON HCL INJECTION 0.25 MG/5ML
0.2500 mg | Freq: Once | INTRAVENOUS | Status: AC
  Administered 2020-05-26: 0.25 mg via INTRAVENOUS

## 2020-05-26 MED ORDER — POTASSIUM CHLORIDE IN NACL 20-0.9 MEQ/L-% IV SOLN
INTRAVENOUS | Status: DC
  Filled 2020-05-26 (×2): qty 1000

## 2020-05-26 MED ORDER — ALUM & MAG HYDROXIDE-SIMETH 200-200-20 MG/5ML PO SUSP
30.0000 mL | Freq: Once | ORAL | Status: AC
Start: 2020-05-26 — End: 2020-05-26
  Administered 2020-05-26: 30 mL via ORAL

## 2020-05-26 NOTE — Patient Instructions (Signed)
[facility]  Discharge Instructions: Thank you for choosing [facility] to provide your oncology and hematology care.   If you have a lab appointment with the [facility], please go directly to the [facility] and check in at the registration area.   Wear comfortable clothing and clothing appropriate for easy access to any Portacath or PICC line.   We strive to give you quality time with your provider. You may need to reschedule your appointment if you arrive late (15 or more minutes).  Arriving late affects you and other patients whose appointments are after yours.  Also, if you miss three or more appointments without notifying the office, you may be dismissed from the clinic at the provider's discretion.      For prescription refill requests, have your pharmacy contact our office and allow 72 hours for refills to be completed.    Today you received the following chemotherapy and/or immunotherapy agents cisplatin; alimta    To help prevent nausea and vomiting after your treatment, we encourage you to take your nausea medication as directed.  BELOW ARE SYMPTOMS THAT SHOULD BE REPORTED IMMEDIATELY: . *FEVER GREATER THAN 100.4 F (38 C) OR HIGHER . *CHILLS OR SWEATING . *NAUSEA AND VOMITING THAT IS NOT CONTROLLED WITH YOUR NAUSEA MEDICATION . *UNUSUAL SHORTNESS OF BREATH . *UNUSUAL BRUISING OR BLEEDING . *URINARY PROBLEMS (pain or burning when urinating, or frequent urination) . *BOWEL PROBLEMS (unusual diarrhea, constipation, pain near the anus) . TENDERNESS IN MOUTH AND THROAT WITH OR WITHOUT PRESENCE OF ULCERS (sore throat, sores in mouth, or a toothache) . UNUSUAL RASH, SWELLING OR PAIN  . UNUSUAL VAGINAL DISCHARGE OR ITCHING   Items with * indicate a potential emergency and should be followed up as soon as possible or go to the Emergency Department if any problems should occur.  Please show the CHEMOTHERAPY ALERT CARD or IMMUNOTHERAPY  ALERT CARD at check-in to the Emergency Department and triage nurse.  Should you have questions after your visit or need to cancel or reschedule your appointment, please contact [facility]  Dept: [phone]  and follow the prompts.  Office hours are 8:00 a.m. to 4:30 p.m. Monday - Friday. Please note that voicemails left after 4:00 p.m. may not be returned until the following business day.  We are closed weekends and major holidays. You have access to a nurse at all times for urgent questions. Please call the main number to the clinic Dept: [phone] and follow the prompts.   For any non-urgent questions, you may also contact your provider using MyChart. We now offer e-Visits for anyone 67 and older to request care online for non-urgent symptoms. For details visit [email or web address].   Also download the MyChart app! Go to the app store, search "MyChart", open the app, select [facility], and log in with your MyChart username and password.  Due to Covid, a mask is required upon entering the hospital/clinic. If you do not have a mask, one will be given to you upon arrival. For doctor visits, patients may have 1 support person aged 57 or older with them. For treatment visits, patients cannot have anyone with them due to current Covid guidelines and our immunocompromised population.

## 2020-05-26 NOTE — Progress Notes (Signed)
[name] [phone]   [phone]  OFFICE PROGRESS NOTE  [name], NP [place] [place] [address]  DIAGNOSIS: Stage IIA (T2b, N0, M0) non-small cell lung cancer, adenocarcinoma presented with right upper lobe lung mass diagnosed in February 2022.  Biomarker Findings Microsatellite status - MS-Stable Tumor Mutational Burden - 4 Muts/Mb Genomic Findings For a complete list of the genes assayed, please refer to the Appendix. KRAS G12D KEAP1 [id number] 7 Disease relevant genes with no reportable alterations: ALK, BRAF, EGFR, ERBB2, MET, RET, ROS1  PDL1 Expression 5%.  PRIOR THERAPY: Status post robotic assisted right video thoracoscopy with right upper lobectomy and mediastinal lymph node sampling under the care of Dr. Kipp Brood on March 28, 2020  CURRENT THERAPY: Adjuvant systemic chemotherapy with cisplatin 75 Mg/M2 and Alimta 500 Mg/M2 every 3 weeks.  First dose May 05, 2020.  INTERVAL HISTORY: Jerry [name] 45 y.o. male returns to the clinic today for follow-up visit.  The patient is feeling fine today with no concerning complaints.  He has a lot of nausea as well as fatigue and weakness for 1 week after the first cycle of his treatment.  He was started on several nausea medication including Compazine and Zofran as well as Ativan.  He was also started on Zyprexa but could not tolerate it with weird dreams and night sweats.  He discontinued the Zyprexa.  He was giving IV fluid for hydration with IV Zofran 1 week after the treatment and he felt much better. He was seen by his primary care physician and was also given prescription for Phenergan.  The patient denied having any current chest pain, shortness of breath, cough or hemoptysis.  He denied having any fever or chills.  He has no nausea, vomiting, diarrhea or constipation.  He has no headache or visual changes.  The patient is here today for evaluation before starting  cycle #2.  MEDICAL HISTORY: Past Medical History:  Diagnosis Date  . Cancer ([facility])   . 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 Former smoker   . Pneumonia     ALLERGIES:  has No Known Allergies.  MEDICATIONS:  Current Outpatient Medications  Medication Sig Dispense Refill  . dexamethasone (DECADRON) 4 MG tablet 4 mg p.o. twice daily the day before, day of and day after chemotherapy every 3 weeks 40 tablet 0  . folic acid (FOLVITE) 1 MG tablet Take 1 tablet (1 mg total) by mouth daily. 30 tablet 4  . LORazepam (ATIVAN) 0.5 MG tablet Place 1 tablet (0.5 mg total) under the tongue every 8 (eight) hours as needed (Nausea). 30 tablet 0  . ondansetron (ZOFRAN ODT) 8 MG disintegrating tablet Take 1 tablet (8 mg total) by mouth every 8 (eight) hours as needed for nausea or vomiting. Do not use until 3 days after chemotherapy 20 tablet 0  . promethazine (PHENERGAN) 25 MG tablet Take 1 tablet (25 mg total) by mouth every 8 (eight) hours as needed for nausea or vomiting. 20 tablet 0   No current facility-administered medications for this visit.   Facility-Administered Medications Ordered in Other Visits  Medication Dose Route Frequency Provider Last Rate Last Admin  . 0.9 %  sodium chloride infusion   Intravenous Continuous Curt Bears, MD      . 0.9 %  sodium chloride infusion   Intravenous Continuous Curt Bears, MD 900 mL/hr at 05/11/20 0940 New Bag at 05/11/20 0940  . ondansetron (ZOFRAN)  injection 8 mg  8 mg Intravenous Once Ardeen Garland, RN        SURGICAL HISTORY:  Past Surgical History:  Procedure Laterality Date  . INTERCOSTAL NERVE BLOCK Right 03/28/2020   Procedure: INTERCOSTAL NERVE BLOCK;  Surgeon: [name], MD;  Location: [facility];  Service: Thoracic;  Laterality: Right;  . LUNG LOBECTOMY Right 03/30/2020  . LYMPH NODE DISSECTION Right 03/28/2020   Procedure: LYMPH NODE DISSECTION;  Surgeon: [name], MD;  Location: [facility];  Service: Thoracic;  Laterality:  Right;  Marland Kitchen VIDEO BRONCHOSCOPY WITH ENDOBRONCHIAL NAVIGATION Right 03/02/2020   Procedure: VIDEO BRONCHOSCOPY WITH ENDOBRONCHIAL NAVIGATION;  Surgeon: Garner Nash, DO;  Location: [facility];  Service: Pulmonary;  Laterality: Right;  Marland Kitchen VIDEO BRONCHOSCOPY WITH ENDOBRONCHIAL ULTRASOUND Bilateral 03/02/2020   Procedure: VIDEO BRONCHOSCOPY WITH ENDOBRONCHIAL ULTRASOUND;  Surgeon: Garner Nash, DO;  Location: [facility];  Service: Pulmonary;  Laterality: Bilateral;  . WISDOM TOOTH EXTRACTION      REVIEW OF SYSTEMS:  A comprehensive review of systems was negative except for: Constitutional: positive for fatigue   PHYSICAL EXAMINATION: General appearance: alert, cooperative and no distress Head: Normocephalic, without obvious abnormality, atraumatic Neck: no adenopathy, no JVD, supple, symmetrical, trachea midline and thyroid not enlarged, symmetric, no tenderness/mass/nodules Lymph nodes: Cervical, supraclavicular, and axillary nodes normal. Resp: clear to auscultation bilaterally Back: symmetric, no curvature. ROM normal. No CVA tenderness. Cardio: regular rate and rhythm, S1, S2 normal, no murmur, click, rub or gallop GI: soft, non-tender; bowel sounds normal; no masses,  no organomegaly Extremities: extremities normal, atraumatic, no cyanosis or edema  ECOG PERFORMANCE STATUS: 1 - Symptomatic but completely ambulatory  Blood pressure 139/79, pulse 71, temperature (!) 96.8 F (36 C), temperature source Tympanic, resp. rate 19, height _0  (1.803 m), weight 285 lb 1.6 oz (129.3 kg), SpO2 99 %.  LABORATORY DATA: Lab Results  Component Value Date   WBC 9.1 05/26/2020   HGB 13.7 05/26/2020   HCT 40.3 05/26/2020   MCV 90.6 05/26/2020   PLT 403 (H) 05/26/2020      Chemistry      Component Value Date/Time   NA 141 05/18/2020 1108   NA 142 02/22/2020 0948   K 4.0 05/18/2020 1108   CL 106 05/18/2020 1108   CO2 27 05/18/2020 1108   BUN 15 05/18/2020 1108   BUN 11 02/22/2020 0948    CREATININE 0.87 05/18/2020 1108      Component Value Date/Time   CALCIUM 9.0 05/18/2020 1108   ALKPHOS 55 05/18/2020 1108   AST 13 (L) 05/18/2020 1108   ALT 13 05/18/2020 1108   BILITOT <0.2 (L) 05/18/2020 1108       RADIOGRAPHIC STUDIES: DG Chest 2 View  Result Date: 05/13/2020 CLINICAL DATA:  Adenocarcinoma right lung EXAM: CHEST - 2 VIEW COMPARISON:  03/03/2020 FINDINGS: The heart size and mediastinal contours are within normal limits. Both lungs are clear. The visualized skeletal structures are unremarkable. Surgical staples in the right hilar region. IMPRESSION: No active cardiopulmonary disease. Electronically Signed   By: Franchot Gallo M.D.   On: 05/13/2020 11:41    ASSESSMENT AND PLAN: This is a very pleasant 45 years old white male recently diagnosed with a stage IIa (T2b, N0, M0) non-small cell lung cancer, adenocarcinoma status post right upper lobectomy with lymph node sampling under the care of Dr. Kipp Brood on March 28, 2020. The patient had molecular studies by [facility] that showed no actionable mutation and his PD-L1 expression was  5%. The patient started adjuvant systemic chemotherapy with cisplatin 75 Mg/M2 and Alimta 500 Mg/M2 last week.  He has a rough time with this treatment especially with persistent nausea and vomiting that is intractable and not responding to the current medication with Compazine, Zofran, Decadron and Ativan. He was also treated with Zyprexa but could not tolerate it. He felt much better after receiving IV hydration with IV Zofran 1 week after the treatment. I recommended for the patient to proceed with cycle #2 today as planned.  He was also advised to call immediately if he started having any significant nausea and vomiting or lack of hydration.  We will bring the patient to the clinic for IV hydration and antiemetics. The patient would like to have his blood work done at [facility] closer to home. He will come back for follow-up  visit in 3 weeks for evaluation before starting cycle #3. He was advised to call immediately if he has any concerning symptoms in the interval. The patient voices understanding of current disease status and treatment options and is in agreement with the current care plan.  All questions were answered. The patient knows to call the clinic with any problems, questions or concerns. We can certainly see the patient much sooner if necessary.   Disclaimer: This note was dictated with voice recognition software. Similar sounding words can inadvertently be transcribed and may not be corrected upon review.

## 2020-05-31 ENCOUNTER — Other Ambulatory Visit: Payer: Self-pay | Admitting: Medical Oncology

## 2020-05-31 ENCOUNTER — Telehealth: Payer: Self-pay | Admitting: Medical Oncology

## 2020-05-31 DIAGNOSIS — R112 Nausea with vomiting, unspecified: Secondary | ICD-10-CM

## 2020-05-31 NOTE — Telephone Encounter (Signed)
Vomiting-Sunday and again last night > went to [facility] ED for vomiting -got 2 liters of IVF.  Today vomiting and diarrhea again. Took Ativan , phenergan. Zofran, Imodium today . He is unable to keep po fluids down.Appt tomorrow.  Per  Julien Nordmann add on IVF for tomorrow . Schedule message sent.  Orders under sign and held

## 2020-06-01 ENCOUNTER — Inpatient Hospital Stay: Payer: 59

## 2020-06-01 ENCOUNTER — Other Ambulatory Visit: Payer: Self-pay

## 2020-06-01 DIAGNOSIS — C3491 Malignant neoplasm of unspecified part of right bronchus or lung: Secondary | ICD-10-CM

## 2020-06-01 DIAGNOSIS — Z5111 Encounter for antineoplastic chemotherapy: Secondary | ICD-10-CM | POA: Diagnosis not present

## 2020-06-01 DIAGNOSIS — R112 Nausea with vomiting, unspecified: Secondary | ICD-10-CM

## 2020-06-01 LAB — CBC WITH DIFFERENTIAL (CANCER CENTER ONLY)
Abs Immature Granulocytes: 0.03 10*3/uL (ref 0.00–0.07)
Basophils Absolute: 0 10*3/uL (ref 0.0–0.1)
Basophils Relative: 0 %
Eosinophils Absolute: 0 10*3/uL (ref 0.0–0.5)
Eosinophils Relative: 1 %
HCT: 46.8 % (ref 39.0–52.0)
Hemoglobin: 16.7 g/dL (ref 13.0–17.0)
Immature Granulocytes: 1 %
Lymphocytes Relative: 27 %
Lymphs Abs: 1.1 10*3/uL (ref 0.7–4.0)
MCH: 30.5 pg (ref 26.0–34.0)
MCHC: 35.7 g/dL (ref 30.0–36.0)
MCV: 85.6 fL (ref 80.0–100.0)
Monocytes Absolute: 0.3 10*3/uL (ref 0.1–1.0)
Monocytes Relative: 7 %
Neutro Abs: 2.8 10*3/uL (ref 1.7–7.7)
Neutrophils Relative %: 64 %
Platelet Count: 277 10*3/uL (ref 150–400)
RBC: 5.47 MIL/uL (ref 4.22–5.81)
RDW: 12.4 % (ref 11.5–15.5)
WBC Count: 4.2 10*3/uL (ref 4.0–10.5)
nRBC: 0 % (ref 0.0–0.2)

## 2020-06-01 LAB — CMP (CANCER CENTER ONLY)
ALT: 29 U/L (ref 0–44)
AST: 21 U/L (ref 15–41)
Albumin: 4 g/dL (ref 3.5–5.0)
Alkaline Phosphatase: 61 U/L (ref 38–126)
Anion gap: 13 (ref 5–15)
BUN: 22 mg/dL — ABNORMAL HIGH (ref 6–20)
CO2: 22 mmol/L (ref 22–32)
Calcium: 9.6 mg/dL (ref 8.9–10.3)
Chloride: 100 mmol/L (ref 98–111)
Creatinine: 0.83 mg/dL (ref 0.61–1.24)
GFR, Estimated: >60 mL/min (ref >60)
Glucose, Bld: 105 mg/dL — ABNORMAL HIGH (ref 70–99)
Potassium: 4.5 mmol/L (ref 3.5–5.1)
Sodium: 135 mmol/L (ref 135–145)
Total Bilirubin: 1.4 mg/dL — ABNORMAL HIGH (ref 0.3–1.2)
Total Protein: 7.4 g/dL (ref 6.5–8.1)

## 2020-06-01 LAB — MAGNESIUM: Magnesium: 1.9 mg/dL (ref 1.7–2.4)

## 2020-06-01 MED ORDER — SODIUM CHLORIDE 0.9 % IV SOLN: 8.0000 mg | Freq: Once | INTRAVENOUS | Status: DC

## 2020-06-01 MED ORDER — ONDANSETRON HCL 4 MG/2ML IJ SOLN
8.0000 mg | Freq: Once | INTRAMUSCULAR | Status: AC
  Administered 2020-06-01: 8 mg via INTRAVENOUS

## 2020-06-01 MED ORDER — ONDANSETRON HCL 4 MG/2ML IJ SOLN
INTRAMUSCULAR | Status: AC
  Filled 2020-06-01: qty 4

## 2020-06-01 MED ORDER — SODIUM CHLORIDE 0.9 % IV SOLN
INTRAVENOUS | Status: DC
  Filled 2020-06-01: qty 250

## 2020-06-01 NOTE — Patient Instructions (Signed)
Dehydration, Adult Dehydration is a condition in which there is not enough water or other fluids in the body. This happens when a person loses more fluids than he or she takes in. Important organs, such as the kidneys, brain, and heart, cannot function without a proper amount of fluids. Any loss of fluids from the body can lead to dehydration. Dehydration can be mild, moderate, or severe. It should be treated right away to prevent it from becoming severe. What are the causes? Dehydration may be caused by:  Conditions that cause loss of water or other fluids, such as diarrhea, vomiting, or sweating or urinating a lot.  Not drinking enough fluids, especially when you are ill or doing activities that require a lot of energy.  Other illnesses and conditions, such as fever or infection.  Certain medicines, such as medicines that remove excess fluid from the body (diuretics).  Lack of safe drinking water.  Not being able to get enough water and food. What increases the risk? The following factors may make you more likely to develop this condition:  Having a long-term (chronic) illness that has not been treated properly, such as diabetes, heart disease, or kidney disease.  Being 65 years of age or older.  Having a disability.  Living in a place that is high in altitude, where thinner, drier air causes more fluid loss.  Doing exercises that put stress on your body for a long time (endurance sports). What are the signs or symptoms? Symptoms of dehydration depend on how severe it is. Mild or moderate dehydration  Thirst.  Dry lips or dry mouth.  Dizziness or light-headedness, especially when standing up from a seated position.  Muscle cramps.  Dark urine. Urine may be the color of tea.  Less urine or tears produced than usual.  Headache. Severe dehydration  Changes in skin. Your skin may be cold and clammy, blotchy, or pale. Your skin also may not return to normal after being  lightly pinched and released.  Little or no tears, urine, or sweat.  Changes in vital signs, such as rapid breathing and low blood pressure. Your pulse may be weak or may be faster than 100 beats a minute when you are sitting still.  Other changes, such as: ? Feeling very thirsty. ? Sunken eyes. ? Cold hands and feet. ? Confusion. ? Being very tired (lethargic) or having trouble waking from sleep. ? Short-term weight loss. ? Loss of consciousness. How is this diagnosed? This condition is diagnosed based on your symptoms and a physical exam. You may have blood and urine tests to help confirm the diagnosis. How is this treated? Treatment for this condition depends on how severe it is. Treatment should be started right away. Do not wait until dehydration becomes severe. Severe dehydration is an emergency and needs to be treated in a hospital.  Mild or moderate dehydration can be treated at home. You may be asked to: ? Drink more fluids. ? Drink an oral rehydration solution (ORS). This drink helps restore proper amounts of fluids and salts and minerals in the blood (electrolytes).  Severe dehydration can be treated: ? With IV fluids. ? By correcting abnormal levels of electrolytes. This is often done by giving electrolytes through a tube that is passed through your nose and into your stomach (nasogastric tube, or NG tube). ? By treating the underlying cause of dehydration. Follow these instructions at home: Oral rehydration solution If told by your health care provider, drink an ORS:  Make   an ORS by following instructions on the package.  Start by drinking small amounts, about  cup (120 mL) every 5-10 minutes.  Slowly increase how much you drink until you have taken the amount recommended by your health care provider. Eating and drinking  Drink enough clear fluid to keep your urine pale yellow. If you were told to drink an ORS, finish the ORS first and then start slowly drinking  other clear fluids. Drink fluids such as: ? Water. Do not drink only water. Doing that can lead to hyponatremia, which is having too little salt (sodium) in the body. ? Water from ice chips you suck on. ? Fruit juice that you have added water to (diluted fruit juice). ? Low-calorie sports drinks.  Eat foods that contain a healthy balance of electrolytes, such as bananas, oranges, potatoes, tomatoes, and spinach.  Do not drink alcohol.  Avoid the following: ? Drinks that contain a lot of sugar. These include high-calorie sports drinks, fruit juice that is not diluted, and soda. ? Caffeine. ? Foods that are greasy or contain a lot of fat or sugar.         General instructions  Take over-the-counter and prescription medicines only as told by your health care provider.  Do not take sodium tablets. Doing that can lead to having too much sodium in the body (hypernatremia).  Return to your normal activities as told by your health care provider. Ask your health care provider what activities are safe for you.  Keep all follow-up visits as told by your health care provider. This is important. Contact a health care provider if:  You have muscle cramps, pain, or discomfort, such as: ? Pain in your abdomen and the pain gets worse or stays in one area (localizes). ? Stiff neck.  You have a rash.  You are more irritable than usual.  You are sleepier or have a harder time waking than usual.  You feel weak or dizzy.  You feel very thirsty. Get help right away if you have:  Any symptoms of severe dehydration.  Symptoms of vomiting, such as: ? You cannot eat or drink without vomiting. ? Vomiting gets worse or does not go away. ? Vomit includes blood or green matter (bile).  Symptoms that get worse with treatment.  A fever.  A severe headache.  Problems with urination or bowel movements, such as: ? Diarrhea that gets worse or does not go away. ? Blood in your stool (feces).  This may cause stool to look black and tarry. ? Not urinating, or urinating only a small amount of very dark urine, within 6-8 hours.  Trouble breathing. These symptoms may represent a serious problem that is an emergency. Do not wait to see if the symptoms will go away. Get medical help right away. Call your local emergency services (911 in the [place] Do not drive yourself to the hospital. Summary  Dehydration is a condition in which there is not enough water or other fluids in the body. This happens when a person loses more fluids than he or she takes in.  Treatment for this condition depends on how severe it is. Treatment should be started right away. Do not wait until dehydration becomes severe.  Drink enough clear fluid to keep your urine pale yellow. If you were told to drink an oral rehydration solution (ORS), finish the ORS first and then start slowly drinking other clear fluids.  Take over-the-counter and prescription medicines only as told by your health   care provider.  Get help right away if you have any symptoms of severe dehydration. This information is not intended to replace advice given to you by your health care provider. Make sure you discuss any questions you have with your health care provider. Document Revised: 08/07/2018 Document Reviewed: 08/07/2018 [employer] Patient Education  2021 [employer]  

## 2020-06-08 ENCOUNTER — Inpatient Hospital Stay: Payer: 59 | Attending: Internal Medicine

## 2020-06-08 ENCOUNTER — Other Ambulatory Visit: Payer: Self-pay

## 2020-06-08 DIAGNOSIS — R2232 Localized swelling, mass and lump, left upper limb: Secondary | ICD-10-CM | POA: Insufficient documentation

## 2020-06-08 DIAGNOSIS — Z87891 Personal history of nicotine dependence: Secondary | ICD-10-CM | POA: Insufficient documentation

## 2020-06-08 DIAGNOSIS — C3491 Malignant neoplasm of unspecified part of right bronchus or lung: Secondary | ICD-10-CM

## 2020-06-08 DIAGNOSIS — Z79899 Other long term (current) drug therapy: Secondary | ICD-10-CM | POA: Diagnosis not present

## 2020-06-08 DIAGNOSIS — Z5111 Encounter for antineoplastic chemotherapy: Secondary | ICD-10-CM | POA: Diagnosis not present

## 2020-06-08 DIAGNOSIS — R112 Nausea with vomiting, unspecified: Secondary | ICD-10-CM | POA: Diagnosis not present

## 2020-06-08 DIAGNOSIS — C3411 Malignant neoplasm of upper lobe, right bronchus or lung: Secondary | ICD-10-CM | POA: Diagnosis present

## 2020-06-08 LAB — CMP (CANCER CENTER ONLY)
ALT: 17 U/L (ref 0–44)
AST: 14 U/L — ABNORMAL LOW (ref 15–41)
Albumin: 3.7 g/dL (ref 3.5–5.0)
Alkaline Phosphatase: 45 U/L (ref 38–126)
Anion gap: 11 (ref 5–15)
BUN: 13 mg/dL (ref 6–20)
CO2: 23 mmol/L (ref 22–32)
Calcium: 9.1 mg/dL (ref 8.9–10.3)
Chloride: 106 mmol/L (ref 98–111)
Creatinine: 0.72 mg/dL (ref 0.61–1.24)
GFR, Estimated: >60 mL/min (ref >60)
Glucose, Bld: 96 mg/dL (ref 70–99)
Potassium: 4.3 mmol/L (ref 3.5–5.1)
Sodium: 140 mmol/L (ref 135–145)
Total Bilirubin: 0.3 mg/dL (ref 0.3–1.2)
Total Protein: 6.5 g/dL (ref 6.5–8.1)

## 2020-06-08 LAB — CBC WITH DIFFERENTIAL (CANCER CENTER ONLY)
Abs Immature Granulocytes: 0.03 10*3/uL (ref 0.00–0.07)
Basophils Absolute: 0 10*3/uL (ref 0.0–0.1)
Basophils Relative: 0 %
Eosinophils Absolute: 0 10*3/uL (ref 0.0–0.5)
Eosinophils Relative: 1 %
HCT: 39.3 % (ref 39.0–52.0)
Hemoglobin: 13.4 g/dL (ref 13.0–17.0)
Immature Granulocytes: 1 %
Lymphocytes Relative: 36 %
Lymphs Abs: 1.3 10*3/uL (ref 0.7–4.0)
MCH: 30.5 pg (ref 26.0–34.0)
MCHC: 34.1 g/dL (ref 30.0–36.0)
MCV: 89.5 fL (ref 80.0–100.0)
Monocytes Absolute: 0.6 10*3/uL (ref 0.1–1.0)
Monocytes Relative: 17 %
Neutro Abs: 1.7 10*3/uL (ref 1.7–7.7)
Neutrophils Relative %: 45 %
Platelet Count: 153 10*3/uL (ref 150–400)
RBC: 4.39 MIL/uL (ref 4.22–5.81)
RDW: 13 % (ref 11.5–15.5)
WBC Count: 3.6 10*3/uL — ABNORMAL LOW (ref 4.0–10.5)
nRBC: 0 % (ref 0.0–0.2)

## 2020-06-08 LAB — MAGNESIUM: Magnesium: 1.6 mg/dL — ABNORMAL LOW (ref 1.7–2.4)

## 2020-06-13 NOTE — Progress Notes (Signed)
[facility] OFFICE PROGRESS NOTE  [name], NP [place]. [address] [place] [address]  DIAGNOSIS: Stage IIA (T2b, N0, M0) non-small cell lung cancer, adenocarcinoma presented with right upper lobe lung mass diagnosed in February 2022.   Biomarker Findings Microsatellite status - MS-Stable Tumor Mutational Burden - 4 Muts/Mb Genomic Findings For a complete list of the genes assayed, please refer to the Appendix. KRAS G12D KEAP1 P278S 7 Disease relevant genes with no reportable alterations: ALK, BRAF, EGFR, ERBB2, MET, RET, ROS1   PDL1 Expression 5%.  PRIOR THERAPY: Status post robotic assisted right video thoracoscopy with right upper lobectomy and mediastinal lymph node sampling under the care of Dr. Kipp Brood on March 28, 2020.   CURRENT THERAPY: Adjuvant systemic chemotherapy with cisplatin 75 Mg/M2 and Alimta 500 Mg/M2 every 3 weeks.  First dose May 05, 2020. Status pose 2 cycles. Cisplatin was discontined due to uncontrolled nausea/vomiting. Starting from cycle #3, he will be on carboplatin for an AUC of 5 and Alimta 500 mg/m2.   INTERVAL HISTORY: Jerry [name] 45 y.o. male returns to clinic today for a follow-up visit accompanied by his wife. The patient has been having some fatigue, weakness, and significant nausea/vomiting with his treatment which has prompted several ER visits.  He has prescriptions for multiple antiemetics including Compazine, Zofran, Ativan, Phenergan, and Decadron.  The patient was started on Zyprexa but could not tolerate it secondary to weird and vivid dreams.  After cycle #2, the patient was seen in the emergency room and received IV hydration.  Despite alternating his antiemetics every few hours, the patient continues to endorse nausea and vomiting sometimes every 30 minutes for several consecutive days.  He denies any fever, chills, or night sweats.  He is breathing is stable he denies any current chest pain, shortness of  breath, cough, or hemoptysis.  He sometimes has numbness at the surgical site.  He denies any diarrhea.  He sometimes has constipation and uses a stool softener.  He has a bump on his left forearm that was tender and swollen last week which has significantly improved at this time.  There is no significant tenderness to palpation at this time.  There is no overlying skin changes.  There is no heat, warmth, or drainage.  He denies any headache or visual changes.  The patient is here today for evaluation before considering starting cycle #3 of treatment.  MEDICAL HISTORY: Past Medical History:  Diagnosis Date   Cancer [facility])    Disorder due to vaping    Former smoker    Pneumonia     ALLERGIES:  is allergic to olanzapine.  MEDICATIONS:  Current Outpatient Medications  Medication Sig Dispense Refill   dexamethasone (DECADRON) 4 MG tablet 4 mg p.o. twice daily the day before, day of and day after chemotherapy every 3 weeks 40 tablet 0   folic acid (FOLVITE) 1 MG tablet Take 1 tablet (1 mg total) by mouth daily. 30 tablet 4   LORazepam (ATIVAN) 0.5 MG tablet Place 1 tablet (0.5 mg total) under the tongue every 8 (eight) hours as needed (Nausea). 30 tablet 0   magnesium oxide (MAG-OX) 400 (240 Mg) MG tablet Take 1 tablet (400 mg total) by mouth daily. 30 tablet 2   ondansetron (ZOFRAN ODT) 8 MG disintegrating tablet Take 1 tablet (8 mg total) by mouth every 8 (eight) hours as needed for nausea or vomiting. Do not use until 3 days after chemotherapy 20 tablet 0  promethazine (PHENERGAN) 25 MG tablet Take 1 tablet (25 mg total) by mouth every 8 (eight) hours as needed for nausea or vomiting. 20 tablet 0   No current facility-administered medications for this visit.   Facility-Administered Medications Ordered in Other Visits  Medication Dose Route Frequency Provider Last Rate Last Admin   0.9 %  sodium chloride infusion   Intravenous Continuous Curt Bears, MD       0.9 %  sodium chloride  infusion   Intravenous Continuous Curt Bears, MD 900 mL/hr at 05/11/20 0940 New Bag at 05/11/20 0940   ondansetron (ZOFRAN) injection 8 mg  8 mg Intravenous Once Ardeen Garland, RN        SURGICAL HISTORY:  Past Surgical History:  Procedure Laterality Date   INTERCOSTAL NERVE BLOCK Right 03/28/2020   Procedure: INTERCOSTAL NERVE BLOCK;  Surgeon: [name], MD;  Location: [facility];  Service: Thoracic;  Laterality: Right;   LUNG LOBECTOMY Right 03/30/2020   LYMPH NODE DISSECTION Right 03/28/2020   Procedure: LYMPH NODE DISSECTION;  Surgeon: [name], MD;  Location: [facility];  Service: Thoracic;  Laterality: Right;   VIDEO BRONCHOSCOPY WITH ENDOBRONCHIAL NAVIGATION Right 03/02/2020   Procedure: VIDEO BRONCHOSCOPY WITH ENDOBRONCHIAL NAVIGATION;  Surgeon: Garner Nash, DO;  Location: [facility];  Service: Pulmonary;  Laterality: Right;   VIDEO BRONCHOSCOPY WITH ENDOBRONCHIAL ULTRASOUND Bilateral 03/02/2020   Procedure: VIDEO BRONCHOSCOPY WITH ENDOBRONCHIAL ULTRASOUND;  Surgeon: Garner Nash, DO;  Location: [facility];  Service: Pulmonary;  Laterality: Bilateral;   WISDOM TOOTH EXTRACTION      REVIEW OF SYSTEMS:   Review of Systems  Constitutional: Positive for fatigue, generalized weakness, and weight loss.  Negative for chills and ever. HENT: Negative for mouth sores, nosebleeds, sore throat and trouble swallowing.   Eyes: Negative for eye problems and icterus.  Respiratory: Negative for cough, hemoptysis, shortness of breath and wheezing.   Cardiovascular: Negative for chest pain and leg swelling.  Gastrointestinal: Positive for nausea, vomiting, and constipation.  Negative for abdominal pain and diarrhea. Genitourinary: Negative for bladder incontinence, difficulty urinating, dysuria, frequency and hematuria.   Musculoskeletal: Negative for back pain, gait problem, neck pain and neck stiffness.  Skin: Negative for itching and rash.  Neurological: Negative for dizziness,  extremity weakness, gait problem, headaches, light-headedness and seizures.  Hematological: Negative for adenopathy. Does not bruise/bleed easily.  Psychiatric/Behavioral: Negative for confusion, depression and sleep disturbance. The patient is not nervous/anxious.     PHYSICAL EXAMINATION:  Blood pressure (!) 134/91, pulse 87, temperature 97.6 F (36.4 C), temperature source Tympanic, resp. rate 18, height 5' 11"  (1.803 m), weight 281 lb 4.8 oz (127.6 kg), SpO2 94 %.  ECOG PERFORMANCE STATUS: 1 - Symptomatic but completely ambulatory  Physical Exam  Constitutional: Oriented to person, place, and time and well-developed, well-nourished, and in no distress.  HENT:  Head: Normocephalic and atraumatic.  Mouth/Throat: Oropharynx is clear and moist. No oropharyngeal exudate.  Eyes: Conjunctivae are normal. Right eye exhibits no discharge. Left eye exhibits no discharge. No scleral icterus.  Neck: Normal range of motion. Neck supple.  Cardiovascular: Normal rate, regular rhythm, normal heart sounds and intact distal pulses.   Pulmonary/Chest: Effort normal and breath sounds normal. No respiratory distress. No wheezes. No rales.  Abdominal: Soft. Bowel sounds are normal. Exhibits no distension and no mass. There is no tenderness.  Musculoskeletal: Normal range of motion. Exhibits no edema.  Lymphadenopathy:    No cervical adenopathy.  Neurological: Alert and oriented to person,  place, and time. Exhibits normal muscle tone. Gait normal. Coordination normal.  Skin: Small subcutaneous knot on the left forearm.  No redness, tenderness, swelling, or pain.  No warmth.  Not diaphoretic. No erythema. No pallor.  Psychiatric: Mood, memory and judgment normal.  Vitals reviewed.  LABORATORY DATA: Lab Results  Component Value Date   WBC 4.5 06/16/2020   HGB 13.9 06/16/2020   HCT 41.0 06/16/2020   MCV 90.7 06/16/2020   PLT 283 06/16/2020      Chemistry      Component Value Date/Time   NA 138  06/16/2020 0842   NA 142 02/22/2020 0948   K 4.6 06/16/2020 0842   CL 104 06/16/2020 0842   CO2 22 06/16/2020 0842   BUN 17 06/16/2020 0842   BUN 11 02/22/2020 0948   CREATININE 0.81 06/16/2020 0842      Component Value Date/Time   CALCIUM 9.7 06/16/2020 0842   ALKPHOS 51 06/16/2020 0842   AST 16 06/16/2020 0842   ALT 18 06/16/2020 0842   BILITOT 0.4 06/16/2020 0842       RADIOGRAPHIC STUDIES:  No results found.   ASSESSMENT/PLAN:  This is a very pleasant 45 year old Caucasian male diagnosed with stage IIa (T2b, N0, M0) non-small cell lung cancer, adenocarcinoma.  He was diagnosed in March 2022.  His PD-L1 expression is 5%.  He has no actionable mutations.  The patient is status post a right upper lobectomy with lymph node sampling under the care of Dr. Kipp Brood which was performed on March 28, 2020.  The patient is currently undergoing adjuvant cisplatin 75 mg per metered squared and Alimta 500 mg per metered squared.  He is status post 2 cycles.  He has been having some persistent nausea and vomiting which is not responsive to the current medications.  He has antiemetics for Compazine, Zofran, Decadron, Ativan, and Phenergan.  The patient was seen with Dr. Julien Nordmann today.  Labs are reviewed.  Due to the patient's uncontrolled nausea vomiting, Dr. Julien Nordmann will discontinue cisplatin and replace it with carboplatin for an AUC of 5.  We will also arrange for the patient to receive IV hydration and antiemetics with Zofran on Saturday.  Recommend that he cycle #3 today's schedule with carboplatin and Alimta.  His magnesium is slightly low today.  I sent a prescription for magnesium oxide 40 mg p.o. daily to his pharmacy.   We will see him back for follow-up visit in 3 weeks for evaluation before starting cycle #4.   The patient's FMLA will run out around July 20.  The patient's work requesting a letter indicating the time needed to be excused from work.  We will write a letter and  fax it to his work indicating that he should be out of work until July 11 upon completion of his treatment.  I reviewed constipation education with the patient.  The patient was instructed to monitor the subcutaneous skin lesion at this time.  It has already significantly improved compared to last week.  The patient was advised to call immediately if he has any concerning symptoms in the interval. The patient voices understanding of current disease status and treatment options and is in agreement with the current care plan. All questions were answered. The patient knows to call the clinic with any problems, questions or concerns. We can certainly see the patient much sooner if necessary    No orders of the defined types were placed in this encounter.    Cheston Coury L Dailon Sheeran, PA-C  06/16/20  ADDENDUM: Hematology/Oncology Attending: I had a face-to-face encounter with the patient.  I reviewed his records, labs and recommended his care plan.  This is a very pleasant 45 years old white male diagnosed with a stage IIa (T2b,, N0, M0) non-small cell lung cancer, adenocarcinoma in March 2022 with PD-L1 expression of 5% and no actionable mutations.  The patient is status post right upper lobectomy with lymph node sampling under the care of Dr. Kipp Brood on March 28, 2020. He started adjuvant systemic chemotherapy with cisplatin 75 Mg/M2 and Alimta 500 Mg/M2 every 3 weeks status post 2 cycles.  The patient has a rough time with this treatment with significant delayed nausea and vomiting that lasted with him for more than 2 weeks despite all the antiemetics and IV fluids that he has been receiving. I had a lengthy discussion with the patient and his wife today about his condition. I recommended for him to change the treatment regimen to carboplatin for AUC of 5 and Alimta 500 Mg/M2 starting from cycle #3 with the hope for less delayed nausea and vomiting after the chemotherapy that could have been  related to the cisplatin. The patient and his wife are in agreement with the current plan. We will also arrange for the patient to come back for IV fluid 2 days after the treatment. Will continue to monitor his blood count closely during the treatment. He will come back for follow-up visit in 3 weeks for evaluation before starting the last cycle of his treatment. He was advised to call immediately if he has any other concerning symptoms in the interval. The total time spent in the appointment was 35 minutes. Disclaimer: This note was dictated with voice recognition software. Similar sounding words can inadvertently be transcribed and may be missed upon review. Eilleen Kempf, MD 06/17/20

## 2020-06-15 MED FILL — Fosaprepitant Dimeglumine For IV Infusion 150 MG (Base Eq): INTRAVENOUS | Qty: 5 | Status: AC

## 2020-06-15 MED FILL — Dexamethasone Sodium Phosphate Inj 100 MG/10ML: INTRAMUSCULAR | Qty: 1 | Status: AC

## 2020-06-16 ENCOUNTER — Inpatient Hospital Stay: Payer: 59

## 2020-06-16 ENCOUNTER — Inpatient Hospital Stay: Payer: 59 | Admitting: Physician Assistant

## 2020-06-16 ENCOUNTER — Telehealth: Payer: Self-pay | Admitting: Physician Assistant

## 2020-06-16 ENCOUNTER — Other Ambulatory Visit: Payer: Self-pay

## 2020-06-16 VITALS — BP 120/76

## 2020-06-16 DIAGNOSIS — Z5111 Encounter for antineoplastic chemotherapy: Secondary | ICD-10-CM | POA: Diagnosis not present

## 2020-06-16 DIAGNOSIS — C3491 Malignant neoplasm of unspecified part of right bronchus or lung: Secondary | ICD-10-CM

## 2020-06-16 DIAGNOSIS — E860 Dehydration: Secondary | ICD-10-CM

## 2020-06-16 LAB — CMP (CANCER CENTER ONLY)
ALT: 18 U/L (ref 0–44)
AST: 16 U/L (ref 15–41)
Albumin: 4.1 g/dL (ref 3.5–5.0)
Alkaline Phosphatase: 51 U/L (ref 38–126)
Anion gap: 12 (ref 5–15)
BUN: 17 mg/dL (ref 6–20)
CO2: 22 mmol/L (ref 22–32)
Calcium: 9.7 mg/dL (ref 8.9–10.3)
Chloride: 104 mmol/L (ref 98–111)
Creatinine: 0.81 mg/dL (ref 0.61–1.24)
GFR, Estimated: >60 mL/min (ref >60)
Glucose, Bld: 189 mg/dL — ABNORMAL HIGH (ref 70–99)
Potassium: 4.6 mmol/L (ref 3.5–5.1)
Sodium: 138 mmol/L (ref 135–145)
Total Bilirubin: 0.4 mg/dL (ref 0.3–1.2)
Total Protein: 7.3 g/dL (ref 6.5–8.1)

## 2020-06-16 LAB — CBC WITH DIFFERENTIAL (CANCER CENTER ONLY)
Abs Immature Granulocytes: 0.06 10*3/uL (ref 0.00–0.07)
Basophils Absolute: 0 10*3/uL (ref 0.0–0.1)
Basophils Relative: 0 %
Eosinophils Absolute: 0 10*3/uL (ref 0.0–0.5)
Eosinophils Relative: 0 %
HCT: 41 % (ref 39.0–52.0)
Hemoglobin: 13.9 g/dL (ref 13.0–17.0)
Immature Granulocytes: 1 %
Lymphocytes Relative: 13 %
Lymphs Abs: 0.6 10*3/uL — ABNORMAL LOW (ref 0.7–4.0)
MCH: 30.8 pg (ref 26.0–34.0)
MCHC: 33.9 g/dL (ref 30.0–36.0)
MCV: 90.7 fL (ref 80.0–100.0)
Monocytes Absolute: 0.2 10*3/uL (ref 0.1–1.0)
Monocytes Relative: 4 %
Neutro Abs: 3.7 10*3/uL (ref 1.7–7.7)
Neutrophils Relative %: 82 %
Platelet Count: 283 10*3/uL (ref 150–400)
RBC: 4.52 MIL/uL (ref 4.22–5.81)
RDW: 13.4 % (ref 11.5–15.5)
WBC Count: 4.5 10*3/uL (ref 4.0–10.5)
nRBC: 0 % (ref 0.0–0.2)

## 2020-06-16 LAB — MAGNESIUM: Magnesium: 1.6 mg/dL — ABNORMAL LOW (ref 1.7–2.4)

## 2020-06-16 MED ORDER — PALONOSETRON HCL INJECTION 0.25 MG/5ML
0.2500 mg | Freq: Once | INTRAVENOUS | Status: AC
Start: 2020-06-16 — End: 2020-06-16
  Administered 2020-06-16: 0.25 mg via INTRAVENOUS

## 2020-06-16 MED ORDER — SODIUM CHLORIDE 0.9 % IV SOLN
500.0000 mg/m2 | Freq: Once | INTRAVENOUS | Status: AC
  Administered 2020-06-16: 1300 mg via INTRAVENOUS
  Filled 2020-06-16: qty 40

## 2020-06-16 MED ORDER — SODIUM CHLORIDE 0.9 % IV SOLN
750.0000 mg | Freq: Once | INTRAVENOUS | Status: AC
  Administered 2020-06-16: 750 mg via INTRAVENOUS
  Filled 2020-06-16: qty 75

## 2020-06-16 MED ORDER — SODIUM CHLORIDE 0.9 % IV SOLN
10.0000 mg | Freq: Once | INTRAVENOUS | Status: AC
  Administered 2020-06-16: 10 mg via INTRAVENOUS
  Filled 2020-06-16: qty 10

## 2020-06-16 MED ORDER — CYANOCOBALAMIN 1000 MCG/ML IJ SOLN
1000.0000 ug | Freq: Once | INTRAMUSCULAR | Status: AC
Start: 2020-06-16 — End: 2020-06-16
  Administered 2020-06-16: 1000 ug via INTRAMUSCULAR

## 2020-06-16 MED ORDER — SODIUM CHLORIDE 0.9 % IV SOLN
150.0000 mg | Freq: Once | INTRAVENOUS | Status: AC
  Administered 2020-06-16: 150 mg via INTRAVENOUS
  Filled 2020-06-16: qty 5

## 2020-06-16 MED ORDER — PALONOSETRON HCL INJECTION 0.25 MG/5ML
INTRAVENOUS | Status: AC
  Filled 2020-06-16: qty 5

## 2020-06-16 MED ORDER — MAGNESIUM OXIDE -MG SUPPLEMENT 400 (240 MG) MG PO TABS: 400.0000 mg | ORAL_TABLET | Freq: Every day | ORAL | 2 refills | Status: DC

## 2020-06-16 MED ORDER — SODIUM CHLORIDE 0.9 % IV SOLN
Freq: Once | INTRAVENOUS | Status: AC
Start: 2020-06-16 — End: 2020-06-16
  Filled 2020-06-16: qty 250

## 2020-06-16 MED ORDER — CYANOCOBALAMIN 1000 MCG/ML IJ SOLN
INTRAMUSCULAR | Status: AC
  Filled 2020-06-16: qty 1

## 2020-06-16 NOTE — Telephone Encounter (Signed)
Scheduled appointment per 06/09 schedule message. Patient is aware.

## 2020-06-16 NOTE — Patient Instructions (Signed)
[name]  Discharge Instructions: Thank you for choosing [facility] to provide your oncology and hematology care.   If you have a lab appointment with the [facility], please go directly to the [facility] and check in at the registration area.   Wear comfortable clothing and clothing appropriate for easy access to any Portacath or PICC line.   We strive to give you quality time with your provider. You may need to reschedule your appointment if you arrive late (15 or more minutes).  Arriving late affects you and other patients whose appointments are after yours.  Also, if you miss three or more appointments without notifying the office, you may be dismissed from the clinic at the provider's discretion.      For prescription refill requests, have your pharmacy contact our office and allow 72 hours for refills to be completed.    Today you received the following chemotherapy and/or immunotherapy agents alimta, and carboplatin.    To help prevent nausea and vomiting after your treatment, we encourage you to take your nausea medication as directed.  BELOW ARE SYMPTOMS THAT SHOULD BE REPORTED IMMEDIATELY: *FEVER GREATER THAN 100.4 F (38 C) OR HIGHER *CHILLS OR SWEATING *NAUSEA AND VOMITING THAT IS NOT CONTROLLED WITH YOUR NAUSEA MEDICATION *UNUSUAL SHORTNESS OF BREATH *UNUSUAL BRUISING OR BLEEDING *URINARY PROBLEMS (pain or burning when urinating, or frequent urination) *BOWEL PROBLEMS (unusual diarrhea, constipation, pain near the anus) TENDERNESS IN MOUTH AND THROAT WITH OR WITHOUT PRESENCE OF ULCERS (sore throat, sores in mouth, or a toothache) UNUSUAL RASH, SWELLING OR PAIN  UNUSUAL VAGINAL DISCHARGE OR ITCHING   Items with * indicate a potential emergency and should be followed up as soon as possible or go to the Emergency Department if any problems should occur.  Please show the CHEMOTHERAPY ALERT CARD or IMMUNOTHERAPY ALERT CARD at  check-in to the Emergency Department and triage nurse.  Should you have questions after your visit or need to cancel or reschedule your appointment, please contact [facility]  Dept: [phone]  and follow the prompts.  Office hours are 8:00 a.m. to 4:30 p.m. Monday - Friday. Please note that voicemails left after 4:00 p.m. may not be returned until the following business day.  We are closed weekends and major holidays. You have access to a nurse at all times for urgent questions. Please call the main number to the clinic Dept: [phone] and follow the prompts.   For any non-urgent questions, you may also contact your provider using MyChart. We now offer e-Visits for anyone 57 and older to request care online for non-urgent symptoms. For details visit [email or web address].   Also download the MyChart app! Go to the app store, search "MyChart", open the app, select [facility], and log in with your MyChart username and password.  Due to Covid, a mask is required upon entering the hospital/clinic. If you do not have a mask, one will be given to you upon arrival. For doctor visits, patients may have 1 support person aged 89 or older with them. For treatment visits, patients cannot have anyone with them due to current Covid guidelines and our immunocompromised population.  Carboplatin injection What is this medicine? CARBOPLATIN (KAR boe pla tin) is a chemotherapy drug. It targets fast dividing cells, like cancer cells, and causes these cells to die. This medicine is used to treat ovarian cancer and many other cancers. This medicine may be used for other purposes; ask  your health care provider or pharmacist if you have questions. COMMON BRAND NAME(S): Paraplatin What should I tell my health care provider before I take this medicine? They need to know if you have any of these conditions: blood disorders hearing problems kidney disease recent or ongoing radiation  therapy an unusual or allergic reaction to carboplatin, cisplatin, other chemotherapy, other medicines, foods, dyes, or preservatives pregnant or trying to get pregnant breast-feeding How should I use this medicine? This drug is usually given as an infusion into a vein. It is administered in a hospital or clinic by a specially trained health care professional. Talk to your pediatrician regarding the use of this medicine in children. Special care may be needed. Overdosage: If you think you have taken too much of this medicine contact a poison control center or emergency room at once. NOTE: This medicine is only for you. Do not share this medicine with others. What if I miss a dose? It is important not to miss a dose. Call your doctor or health care professional if you are unable to keep an appointment. What may interact with this medicine? medicines for seizures medicines to increase blood counts like filgrastim, pegfilgrastim, sargramostim some antibiotics like amikacin, gentamicin, neomycin, streptomycin, tobramycin vaccines Talk to your doctor or health care professional before taking any of these medicines: acetaminophen aspirin ibuprofen ketoprofen naproxen This list may not describe all possible interactions. Give your health care provider a list of all the medicines, herbs, non-prescription drugs, or dietary supplements you use. Also tell them if you smoke, drink alcohol, or use illegal drugs. Some items may interact with your medicine. What should I watch for while using this medicine? Your condition will be monitored carefully while you are receiving this medicine. You will need important blood work done while you are taking this medicine. This drug may make you feel generally unwell. This is not uncommon, as chemotherapy can affect healthy cells as well as cancer cells. Report any side effects. Continue your course of treatment even though you feel ill unless your doctor tells you  to stop. In some cases, you may be given additional medicines to help with side effects. Follow all directions for their use. Call your doctor or health care professional for advice if you get a fever, chills or sore throat, or other symptoms of a cold or flu. Do not treat yourself. This drug decreases your body's ability to fight infections. Try to avoid being around people who are sick. This medicine may increase your risk to bruise or bleed. Call your doctor or health care professional if you notice any unusual bleeding. Be careful brushing and flossing your teeth or using a toothpick because you may get an infection or bleed more easily. If you have any dental work done, tell your dentist you are receiving this medicine. Avoid taking products that contain aspirin, acetaminophen, ibuprofen, naproxen, or ketoprofen unless instructed by your doctor. These medicines may hide a fever. Do not become pregnant while taking this medicine. Women should inform their doctor if they wish to become pregnant or think they might be pregnant. There is a potential for serious side effects to an unborn child. Talk to your health care professional or pharmacist for more information. Do not breast-feed an infant while taking this medicine. What side effects may I notice from receiving this medicine? Side effects that you should report to your doctor or health care professional as soon as possible: allergic reactions like skin rash, itching or hives, swelling  of the face, lips, or tongue signs of infection - fever or chills, cough, sore throat, pain or difficulty passing urine signs of decreased platelets or bleeding - bruising, pinpoint red spots on the skin, black, tarry stools, nosebleeds signs of decreased red blood cells - unusually weak or tired, fainting spells, lightheadedness breathing problems changes in hearing changes in vision chest pain high blood pressure low blood counts - This drug may decrease the  number of white blood cells, red blood cells and platelets. You may be at increased risk for infections and bleeding. nausea and vomiting pain, swelling, redness or irritation at the injection site pain, tingling, numbness in the hands or feet problems with balance, talking, walking trouble passing urine or change in the amount of urine Side effects that usually do not require medical attention (report to your doctor or health care professional if they continue or are bothersome): hair loss loss of appetite metallic taste in the mouth or changes in taste This list may not describe all possible side effects. Call your doctor for medical advice about side effects. You may report side effects to FDA at 1-800-FDA-1088. Where should I keep my medicine? This drug is given in a hospital or clinic and will not be stored at home. NOTE: This sheet is a summary. It may not cover all possible information. If you have questions about this medicine, talk to your doctor, pharmacist, or health care provider.  2021 Elsevier/Gold Standard (2007-04-01 14:38:05)

## 2020-06-17 ENCOUNTER — Telehealth: Payer: Self-pay | Admitting: *Deleted

## 2020-06-17 ENCOUNTER — Encounter: Payer: Self-pay | Admitting: Internal Medicine

## 2020-06-17 NOTE — Telephone Encounter (Signed)
Called pt to see how he did with his treatment yesterday.  He reports doing well.  He hasn't needed to take anything for nausea but reviewed with pt to take for least bit of nausea as prescribed.  He reports understanding. He denies any other problems & is drinking & eating OK & knows to call with any problems.

## 2020-06-17 NOTE — Telephone Encounter (Signed)
-----   Message from Rennis Harding, RN sent at 06/16/2020  2:04 PM EDT ----- Regarding: Dr. Julien Nordmann 1st time Carbo 1st time Carboplatin - Pt tolerated well

## 2020-06-18 ENCOUNTER — Other Ambulatory Visit: Payer: Self-pay

## 2020-06-18 ENCOUNTER — Inpatient Hospital Stay: Payer: 59

## 2020-06-18 DIAGNOSIS — Z5111 Encounter for antineoplastic chemotherapy: Secondary | ICD-10-CM | POA: Diagnosis not present

## 2020-06-18 DIAGNOSIS — E860 Dehydration: Secondary | ICD-10-CM

## 2020-06-18 MED ORDER — ONDANSETRON HCL 4 MG/2ML IJ SOLN
INTRAMUSCULAR | Status: AC
  Filled 2020-06-18: qty 4

## 2020-06-18 MED ORDER — SODIUM CHLORIDE 0.9 % IV SOLN
Freq: Once | INTRAVENOUS | Status: AC
  Filled 2020-06-18: qty 250

## 2020-06-18 MED ORDER — ONDANSETRON HCL 40 MG/20ML IJ SOLN
8.0000 mg | Freq: Once | INTRAMUSCULAR | Status: AC
Start: 2020-06-18 — End: 2020-06-18
  Administered 2020-06-18: 8 mg via INTRAVENOUS

## 2020-06-18 MED ORDER — ONDANSETRON HCL 8 MG PO TABS
ORAL_TABLET | ORAL | Status: AC
  Filled 2020-06-18: qty 1

## 2020-06-18 NOTE — Patient Instructions (Signed)

## 2020-06-22 ENCOUNTER — Telehealth: Payer: Self-pay | Admitting: Medical Oncology

## 2020-06-22 ENCOUNTER — Other Ambulatory Visit: Payer: Self-pay | Admitting: Medical Oncology

## 2020-06-22 ENCOUNTER — Inpatient Hospital Stay: Payer: 59

## 2020-06-22 ENCOUNTER — Other Ambulatory Visit: Payer: Self-pay

## 2020-06-22 DIAGNOSIS — C3491 Malignant neoplasm of unspecified part of right bronchus or lung: Secondary | ICD-10-CM

## 2020-06-22 DIAGNOSIS — R112 Nausea with vomiting, unspecified: Secondary | ICD-10-CM

## 2020-06-22 DIAGNOSIS — Z5111 Encounter for antineoplastic chemotherapy: Secondary | ICD-10-CM | POA: Diagnosis not present

## 2020-06-22 LAB — CMP (CANCER CENTER ONLY)
ALT: 17 U/L (ref 0–44)
AST: 16 U/L (ref 15–41)
Albumin: 4.5 g/dL (ref 3.5–5.0)
Alkaline Phosphatase: 66 U/L (ref 38–126)
Anion gap: 15 (ref 5–15)
BUN: 25 mg/dL — ABNORMAL HIGH (ref 6–20)
CO2: 26 mmol/L (ref 22–32)
Calcium: 10.5 mg/dL — ABNORMAL HIGH (ref 8.9–10.3)
Chloride: 94 mmol/L — ABNORMAL LOW (ref 98–111)
Creatinine: 1.02 mg/dL (ref 0.61–1.24)
GFR, Estimated: >60 mL/min (ref >60)
Glucose, Bld: 109 mg/dL — ABNORMAL HIGH (ref 70–99)
Potassium: 4.7 mmol/L (ref 3.5–5.1)
Sodium: 135 mmol/L (ref 135–145)
Total Bilirubin: 1.7 mg/dL — ABNORMAL HIGH (ref 0.3–1.2)
Total Protein: 8.2 g/dL — ABNORMAL HIGH (ref 6.5–8.1)

## 2020-06-22 LAB — CBC WITH DIFFERENTIAL (CANCER CENTER ONLY)
Abs Immature Granulocytes: 0 10*3/uL (ref 0.00–0.07)
Basophils Absolute: 0 10*3/uL (ref 0.0–0.1)
Basophils Relative: 1 %
Eosinophils Absolute: 0 10*3/uL (ref 0.0–0.5)
Eosinophils Relative: 1 %
HCT: 46 % (ref 39.0–52.0)
Hemoglobin: 16.3 g/dL (ref 13.0–17.0)
Immature Granulocytes: 0 %
Lymphocytes Relative: 43 %
Lymphs Abs: 1.2 10*3/uL (ref 0.7–4.0)
MCH: 30.8 pg (ref 26.0–34.0)
MCHC: 35.4 g/dL (ref 30.0–36.0)
MCV: 86.8 fL (ref 80.0–100.0)
Monocytes Absolute: 0.3 10*3/uL (ref 0.1–1.0)
Monocytes Relative: 11 %
Neutro Abs: 1.2 10*3/uL — ABNORMAL LOW (ref 1.7–7.7)
Neutrophils Relative %: 44 %
Platelet Count: 302 10*3/uL (ref 150–400)
RBC: 5.3 MIL/uL (ref 4.22–5.81)
RDW: 13 % (ref 11.5–15.5)
WBC Count: 2.6 10*3/uL — ABNORMAL LOW (ref 4.0–10.5)
nRBC: 0 % (ref 0.0–0.2)

## 2020-06-22 LAB — MAGNESIUM: Magnesium: 1.9 mg/dL (ref 1.7–2.4)

## 2020-06-22 MED ORDER — ONDANSETRON HCL 4 MG/2ML IJ SOLN
INTRAMUSCULAR | Status: AC
  Filled 2020-06-22: qty 4

## 2020-06-22 MED ORDER — LORAZEPAM 2 MG/ML IJ SOLN
INTRAMUSCULAR | Status: AC
  Filled 2020-06-22: qty 1

## 2020-06-22 MED ORDER — ONDANSETRON HCL 4 MG/2ML IJ SOLN
8.0000 mg | Freq: Once | INTRAMUSCULAR | Status: AC
Start: 2020-06-22 — End: 2020-06-22
  Administered 2020-06-22: 8 mg via INTRAVENOUS

## 2020-06-22 MED ORDER — SODIUM CHLORIDE 0.9 % IV SOLN
Freq: Once | INTRAVENOUS | Status: AC
  Filled 2020-06-22: qty 250

## 2020-06-22 MED ORDER — SODIUM CHLORIDE 0.9 % IV SOLN: 8.0000 mg | Freq: Once | INTRAVENOUS | Status: DC

## 2020-06-22 MED ORDER — LORAZEPAM 2 MG/ML IJ SOLN
1.0000 mg | Freq: Once | INTRAMUSCULAR | Status: AC
  Administered 2020-06-22: 1 mg via INTRAVENOUS

## 2020-06-22 NOTE — Telephone Encounter (Signed)
Pt reports intractable nausea and vomiting. He said he has not taken any antiemetic because he feels like he will vomit if he takes a pill. Schedule message sent. Orders under sign and held.

## 2020-06-22 NOTE — Patient Instructions (Signed)

## 2020-06-29 ENCOUNTER — Other Ambulatory Visit: Payer: Self-pay

## 2020-06-29 ENCOUNTER — Inpatient Hospital Stay: Payer: 59

## 2020-06-29 DIAGNOSIS — C3491 Malignant neoplasm of unspecified part of right bronchus or lung: Secondary | ICD-10-CM

## 2020-06-29 DIAGNOSIS — Z5111 Encounter for antineoplastic chemotherapy: Secondary | ICD-10-CM | POA: Diagnosis not present

## 2020-06-29 LAB — CBC WITH DIFFERENTIAL (CANCER CENTER ONLY)
Abs Immature Granulocytes: 0.04 10*3/uL (ref 0.00–0.07)
Basophils Absolute: 0 10*3/uL (ref 0.0–0.1)
Basophils Relative: 0 %
Eosinophils Absolute: 0 10*3/uL (ref 0.0–0.5)
Eosinophils Relative: 0 %
HCT: 33.7 % — ABNORMAL LOW (ref 39.0–52.0)
Hemoglobin: 12 g/dL — ABNORMAL LOW (ref 13.0–17.0)
Immature Granulocytes: 1 %
Lymphocytes Relative: 33 %
Lymphs Abs: 1.2 10*3/uL (ref 0.7–4.0)
MCH: 31.4 pg (ref 26.0–34.0)
MCHC: 35.6 g/dL (ref 30.0–36.0)
MCV: 88.2 fL (ref 80.0–100.0)
Monocytes Absolute: 0.7 10*3/uL (ref 0.1–1.0)
Monocytes Relative: 18 %
Neutro Abs: 1.8 10*3/uL (ref 1.7–7.7)
Neutrophils Relative %: 48 %
Platelet Count: 133 10*3/uL — ABNORMAL LOW (ref 150–400)
RBC: 3.82 MIL/uL — ABNORMAL LOW (ref 4.22–5.81)
RDW: 13.2 % (ref 11.5–15.5)
WBC Count: 3.7 10*3/uL — ABNORMAL LOW (ref 4.0–10.5)
nRBC: 0 % (ref 0.0–0.2)

## 2020-06-29 LAB — CMP (CANCER CENTER ONLY)
ALT: 15 U/L (ref 0–44)
AST: 15 U/L (ref 15–41)
Albumin: 3.8 g/dL (ref 3.5–5.0)
Alkaline Phosphatase: 48 U/L (ref 38–126)
Anion gap: 10 (ref 5–15)
BUN: 14 mg/dL (ref 6–20)
CO2: 25 mmol/L (ref 22–32)
Calcium: 9.4 mg/dL (ref 8.9–10.3)
Chloride: 105 mmol/L (ref 98–111)
Creatinine: 0.72 mg/dL (ref 0.61–1.24)
GFR, Estimated: >60 mL/min (ref >60)
Glucose, Bld: 91 mg/dL (ref 70–99)
Potassium: 4.2 mmol/L (ref 3.5–5.1)
Sodium: 140 mmol/L (ref 135–145)
Total Bilirubin: 0.3 mg/dL (ref 0.3–1.2)
Total Protein: 6.5 g/dL (ref 6.5–8.1)

## 2020-06-29 LAB — MAGNESIUM: Magnesium: 1.5 mg/dL — ABNORMAL LOW (ref 1.7–2.4)

## 2020-06-30 ENCOUNTER — Telehealth: Payer: Self-pay

## 2020-06-30 NOTE — Telephone Encounter (Signed)
Spoke with pt per MD [facility] regarding his recent lab results. Pt verbalizes that he has magnesium oxide at home but has not been taking it "the last few days". Pt verbalizes understanding that he should take his home supply of magnesium oxide 3 times daily per MD [facility]. Pt will call [facility] with concerns.

## 2020-06-30 NOTE — Telephone Encounter (Signed)
-----   Message from Curt Bears, MD sent at 06/29/2020  4:22 PM EDT ----- Please make sure the patient is taking magnesium oxide at home.  If not we can prescribe him magnesium oxide 400 mg p.o. 3 times daily.  Thank you. ----- Message ----- From: Buel Ream, Lab In [facility] Sent: 06/29/2020  11:10 AM EDT To: Curt Bears, MD

## 2020-07-01 ENCOUNTER — Encounter: Payer: Self-pay | Admitting: Internal Medicine

## 2020-07-04 NOTE — Progress Notes (Signed)
[facility] OFFICE PROGRESS NOTE  [name], NP [facility]. [address] [place] [address]  DIAGNOSIS: Stage IIA (T2b, N0, M0) non-small cell lung cancer, adenocarcinoma presented with right upper lobe lung mass diagnosed in February 2022.   Biomarker Findings Microsatellite status - MS-Stable Tumor Mutational Burden - 4 Muts/Mb Genomic Findings For a complete list of the genes assayed, please refer to the Appendix. KRAS G12D KEAP1 P278S 7 Disease relevant genes with no reportable alterations: ALK, BRAF, EGFR, ERBB2, MET, RET, ROS1   PDL1 Expression 5%.  PRIOR THERAPY: Status post robotic assisted right video thoracoscopy with right upper lobectomy and mediastinal lymph node sampling under the care of Dr. Kipp Brood on March 28, 2020.   CURRENT THERAPY: Adjuvant systemic chemotherapy with cisplatin 75 Mg/M2 and Alimta 500 Mg/M2 every 3 weeks.  First dose May 05, 2020. Status pose 3 cycles. Cisplatin was discontined due to uncontrolled nausea/vomiting. Starting from cycle #3, he will be on carboplatin for an AUC of 5 and Alimta 500 mg/m2.   INTERVAL HISTORY: Waylin Dorko 45 y.o. male returns to the clinic today for a follow up visit accompanied by his wife. The patient had significant nausea/vomiting, fatigue, and generalized weakness with cisplatin and Alimta. Therefore, at his last appointment, the Cisplatin was changed to carboplatin. He tolerated this cycle rather poorly reporting similar symptoms that he states lasted a few days longer than the cisplatin. He has continued to take his daily magnesium.  He has prescriptions for multiple antiemetics including Compazine, Zofran, Ativan, Phenergan, and Decadron.  The patient was started on Zyprexa but could not tolerate it secondary to weird and vivid dreams. He came to the clinic in the interval since his last appointments for IVF and antiemetics.   He reports frequent nausea and vomiting lasting 10 days following  the last treatment. He states it was as often as every 30 minutes -2 hours some days and reports mostly dry heaving. He states he has not been taking any of his prescribed antiemetics regularly reporting they will immediatly induce vomiting. He reports the sublingual Zofran also causes vomiting. Despite this, he is willing to continue to try the different mediations more consistently to see if that is effective. During the 10 days he reports ongoing fatigue and appetite loss. His weight is stable compared to his last visit. After the 10 days, the patient has recovered and remained stable. His energy levels and appetite returned, and he denies any nausea/vomiting since those 10 days. He is interested in returning to the clinic again for IVF and antiemetics following this round of treatment considering his previous symptoms.   He denies any fever, chills, or night sweats.  His breathing is stable, and he denies any current chest pain, shortness of breath, or hemoptysis.  He reports no change in his baseline cough. He denies any constipation as that has improved since the previous visit. He denies any headache or visual changes. He denies any numbness/tingling. He continues to have a slight bump on the left forearm that he reports has no change. The patient is here today for evaluation before considering starting cycle #4 of treatment.  MEDICAL HISTORY: Past Medical History:  Diagnosis Date   Cancer [facility])    Disorder due to vaping    Former smoker    Pneumonia     ALLERGIES:  is allergic to olanzapine.  MEDICATIONS:  Current Outpatient Medications  Medication Sig Dispense Refill   dexamethasone (DECADRON) 4 MG tablet 4  mg p.o. twice daily the day before, day of and day after chemotherapy every 3 weeks 40 tablet 0   folic acid (FOLVITE) 1 MG tablet Take 1 tablet (1 mg total) by mouth daily. 30 tablet 4   LORazepam (ATIVAN) 0.5 MG tablet Place 1 tablet (0.5 mg total) under the tongue every 8 (eight)  hours as needed (Nausea). 30 tablet 0   magnesium oxide (MAG-OX) 400 MG tablet Take 1 tablet by mouth daily.     ondansetron (ZOFRAN ODT) 8 MG disintegrating tablet Take 1 tablet (8 mg total) by mouth every 8 (eight) hours as needed for nausea or vomiting. Do not use until 3 days after chemotherapy 20 tablet 0   promethazine (PHENERGAN) 25 MG tablet Take 1 tablet (25 mg total) by mouth every 8 (eight) hours as needed for nausea or vomiting. 20 tablet 0   No current facility-administered medications for this visit.   Facility-Administered Medications Ordered in Other Visits  Medication Dose Route Frequency Provider Last Rate Last Admin   0.9 %  sodium chloride infusion   Intravenous Continuous Curt Bears, MD       0.9 %  sodium chloride infusion   Intravenous Continuous Curt Bears, MD 900 mL/hr at 05/11/20 0940 New Bag at 05/11/20 0940   CARBOplatin (PARAPLATIN) 600 mg in sodium chloride 0.9 % 250 mL chemo infusion  600 mg Intravenous Once Curt Bears, MD       dexamethasone (DECADRON) 10 mg in sodium chloride 0.9 % 50 mL IVPB  10 mg Intravenous Once Curt Bears, MD 204 mL/hr at 07/07/20 1013 10 mg at 07/07/20 1013   fosaprepitant (EMEND) 150 mg in sodium chloride 0.9 % 145 mL IVPB  150 mg Intravenous Once Curt Bears, MD       ondansetron [facility]) injection 8 mg  8 mg Intravenous Once Ardeen Garland, RN       PEMEtrexed (ALIMTA) 1,300 mg in sodium chloride 0.9 % 100 mL chemo infusion  500 mg/m2 (Treatment Plan Recorded) Intravenous Once Curt Bears, MD        SURGICAL HISTORY:  Past Surgical History:  Procedure Laterality Date   INTERCOSTAL NERVE BLOCK Right 03/28/2020   Procedure: INTERCOSTAL NERVE BLOCK;  Surgeon: [name], MD;  Location: [facility];  Service: Thoracic;  Laterality: Right;   LUNG LOBECTOMY Right 03/30/2020   LYMPH NODE DISSECTION Right 03/28/2020   Procedure: LYMPH NODE DISSECTION;  Surgeon: [name], MD;  Location: [facility];   Service: Thoracic;  Laterality: Right;   VIDEO BRONCHOSCOPY WITH ENDOBRONCHIAL NAVIGATION Right 03/02/2020   Procedure: VIDEO BRONCHOSCOPY WITH ENDOBRONCHIAL NAVIGATION;  Surgeon: Garner Nash, DO;  Location: [facility];  Service: Pulmonary;  Laterality: Right;   VIDEO BRONCHOSCOPY WITH ENDOBRONCHIAL ULTRASOUND Bilateral 03/02/2020   Procedure: VIDEO BRONCHOSCOPY WITH ENDOBRONCHIAL ULTRASOUND;  Surgeon: Garner Nash, DO;  Location: [facility];  Service: Pulmonary;  Laterality: Bilateral;   WISDOM TOOTH EXTRACTION      REVIEW OF SYSTEMS:   Review of Systems  Constitutional: Positive fatigue, appetite changes. Negative for chills, fever and unexpected weight change.  HENT:  Negative for mouth sores, nosebleeds, sore throat and trouble swallowing.   Eyes: Negative for eye problems and icterus.  Respiratory: Positive cough. Negative for hemoptysis, shortness of breath and wheezing.   Cardiovascular: Negative for chest pain and leg swelling.  Gastrointestinal: Positive nausea and vomiting (resolved at this time). Negative for abdominal pain, constipation, diarrhea Genitourinary: Negative for bladder incontinence, difficulty urinating, dysuria, frequency and  hematuria.   Musculoskeletal: Negative for back pain, gait problem, neck pain and neck stiffness.  Skin: Negative for itching and rash.  Neurological: Negative for dizziness, extremity weakness, gait problem, headaches, light-headedness and seizures.  Hematological: Negative for adenopathy. Does not bruise/bleed easily.  Psychiatric/Behavioral: Negative for confusion, depression and sleep disturbance. The patient is not nervous/anxious.     PHYSICAL EXAMINATION:  Blood pressure (!) 142/91, pulse 86, temperature (!) 97.1 F (36.2 C), temperature source Tympanic, resp. rate 19, height 5' 11"  (1.803 m), weight 284 lb 3.2 oz (128.9 kg), SpO2 97 %.  ECOG PERFORMANCE STATUS: 1  Physical Exam  Constitutional: Oriented to person, place, and time and  well-developed, well-nourished, and in no distress.  HENT:  Head: Normocephalic and atraumatic.  Mouth/Throat: Oropharynx is clear and moist. No oropharyngeal exudate.  Eyes: Conjunctivae are normal. Right eye exhibits no discharge. Left eye exhibits no discharge. No scleral icterus.  Neck: Normal range of motion. Neck supple.  Cardiovascular: Normal rate, regular rhythm, normal heart sounds and intact distal pulses.   Pulmonary/Chest: Effort normal and breath sounds normal. No respiratory distress. No wheezes. No rales.  Abdominal: Soft. Bowel sounds are normal. Exhibits no distension and no mass. There is no tenderness.  Musculoskeletal: Normal range of motion. Exhibits no edema.  Lymphadenopathy:    No cervical adenopathy.  Neurological: Alert and oriented to person, place, and time. Exhibits normal muscle tone. Gait normal. Coordination normal.  Skin: Skin is warm and dry. No rash noted. Not diaphoretic. No erythema. No pallor.  Psychiatric: Mood, memory and judgment normal.  Vitals reviewed.  LABORATORY DATA: Lab Results  Component Value Date   WBC 4.3 07/07/2020   HGB 13.4 07/07/2020   HCT 38.9 (L) 07/07/2020   MCV 91.5 07/07/2020   PLT 269 07/07/2020      Chemistry      Component Value Date/Time   NA 137 07/07/2020 0838   NA 142 02/22/2020 0948   K 4.8 07/07/2020 0838   CL 105 07/07/2020 0838   CO2 20 (L) 07/07/2020 0838   BUN 21 (H) 07/07/2020 0838   BUN 11 02/22/2020 0948   CREATININE 0.84 07/07/2020 0838      Component Value Date/Time   CALCIUM 10.0 07/07/2020 0838   ALKPHOS 55 07/07/2020 0838   AST 19 07/07/2020 0838   ALT 28 07/07/2020 0838   BILITOT 0.4 07/07/2020 0838       RADIOGRAPHIC STUDIES:  No results found.   ASSESSMENT/PLAN:  This is a very pleasant 45 year old Caucasian male diagnosed with stage IIa (T2b, N0, M0) non-small cell lung cancer, adenocarcinoma.  He was diagnosed in March 2022.  His PD-L1 expression is 5%.  He has no actionable  mutations.   The patient is status post a right upper lobectomy with lymph node sampling under the care of Dr. Kipp Brood which was performed on March 28, 2020.  The patient is currently undergoing adjuvant cisplatin 75 mg per metered squared and Alimta 500 mg per metered squared.  He is status post 3 cycles.  He has been having some persistent nausea and vomiting which is not responsive to the current medications.  He has antiemetics for Compazine, Zofran, Decadron, Ativan, and Phenergan although he is not taking his anti-emetics consistently. Starting from cycle #3, the patient's cisplatin was changed to carboplatin.   The patient was seen with Dr. Julien Nordmann today. Given his symptoms, Dr. Julien Nordmann will reduce his carboplatin to an AUC of 4. Labs were reviewed. Recommend that he proceed with  cycle #4 today as scheduled. This will be his last cycle of adjuvant chemotherapy.   I will arrange for a restaging CT scan of the chest prior to his next appointment in 4 weeks.   He will continue to take magnesium 1x per day. We will monitor his labs closely weekly for the next 3 weeks.   We will see him back for a follow up visit in 4 weeks for evaluation and to review his restaging CT scan.   We will follow up on the return to work paperwork that was given to the clinic.   I will arrange for IV hydration with 1 L of fluid and zofran to be given on 07/09/20.   We will monitor the bump on his left forearm for now.   He was advised to try to take his anti-emetics to see if that helps with his nausea/decreased appetite.   The patient was advised to call immediately if he has any concerning symptoms in the interval. The patient voices understanding of current disease status and treatment options and is in agreement with the current care plan. All questions were answered. The patient knows to call the clinic with any problems, questions or concerns. We can certainly see the patient much sooner if  necessary         Orders Placed This Encounter  Procedures   CT Chest W Contrast    Standing Status:   Future    Standing Expiration Date:   07/07/2021    Order Specific Question:   If indicated for the ordered procedure, I authorize the administration of contrast media per Radiology protocol    Answer:   Yes    Order Specific Question:   Preferred imaging location?    Answer:   [facility], PA-C 07/07/20  ADDENDUM: Hematology/Oncology Attending: I had a face-to-face encounter with the patient today.  I reviewed his record, lab and recommended his care plan.  This is a very pleasant 45 years old white male diagnosed with a stage IIa (T2b, N0, M0) non-small cell lung cancer, adenocarcinoma with PD-L1 expression of 5% and no actionable mutation.  He is status post right upper lobectomy with lymph node dissection on March 28, 2020. The patient started adjuvant treatment with systemic chemotherapy with cisplatin and Alimta status post 2 cycles but this was complicated with significant delayed nausea and vomiting as well as dehydration and weakness.  We changed his treatment to carboplatin and Alimta starting from cycle #3 and he continues to have significant delayed nausea and vomiting after this cycle as well.  He received IV hydration with normal saline as well as antiemetics in the clinic. He is here today for evaluation before starting the last cycle of his treatment. I recommended for him to proceed with cycle #4 with carboplatin but I will reduce the dose to AUC of 4 and he will continue with the same dose of Alimta. We will arrange for the patient to receive IV hydration with normal saline 2 days after his treatment. He will come back for follow-up visit in 1 months for evaluation and repeat CT scan of the chest for restaging of his disease.  He was also advised to take his antiemetics as prescribed and to call if he has any concerning symptoms  in the interval.  Disclaimer: This note was dictated with voice recognition software. Similar sounding words can inadvertently be transcribed and may be missed upon review. Julien Nordmann  Fanny Bien, MD 07/07/20

## 2020-07-07 ENCOUNTER — Inpatient Hospital Stay: Payer: 59

## 2020-07-07 ENCOUNTER — Other Ambulatory Visit: Payer: Self-pay

## 2020-07-07 ENCOUNTER — Telehealth: Payer: Self-pay

## 2020-07-07 ENCOUNTER — Inpatient Hospital Stay: Payer: 59 | Admitting: Physician Assistant

## 2020-07-07 VITALS — BP 142/91 | HR 86 | Temp 97.1°F | Resp 19 | Ht 71.0 in | Wt 284.2 lb

## 2020-07-07 VITALS — BP 134/91 | HR 85

## 2020-07-07 DIAGNOSIS — R112 Nausea with vomiting, unspecified: Secondary | ICD-10-CM | POA: Diagnosis not present

## 2020-07-07 DIAGNOSIS — C3491 Malignant neoplasm of unspecified part of right bronchus or lung: Secondary | ICD-10-CM | POA: Diagnosis not present

## 2020-07-07 DIAGNOSIS — Z5111 Encounter for antineoplastic chemotherapy: Secondary | ICD-10-CM

## 2020-07-07 LAB — CBC WITH DIFFERENTIAL (CANCER CENTER ONLY)
Abs Immature Granulocytes: 0.13 10*3/uL — ABNORMAL HIGH (ref 0.00–0.07)
Basophils Absolute: 0 10*3/uL (ref 0.0–0.1)
Basophils Relative: 0 %
Eosinophils Absolute: 0 10*3/uL (ref 0.0–0.5)
Eosinophils Relative: 0 %
HCT: 38.9 % — ABNORMAL LOW (ref 39.0–52.0)
Hemoglobin: 13.4 g/dL (ref 13.0–17.0)
Immature Granulocytes: 3 %
Lymphocytes Relative: 13 %
Lymphs Abs: 0.6 10*3/uL — ABNORMAL LOW (ref 0.7–4.0)
MCH: 31.5 pg (ref 26.0–34.0)
MCHC: 34.4 g/dL (ref 30.0–36.0)
MCV: 91.5 fL (ref 80.0–100.0)
Monocytes Absolute: 0.2 10*3/uL (ref 0.1–1.0)
Monocytes Relative: 4 %
Neutro Abs: 3.4 10*3/uL (ref 1.7–7.7)
Neutrophils Relative %: 80 %
Platelet Count: 269 10*3/uL (ref 150–400)
RBC: 4.25 MIL/uL (ref 4.22–5.81)
RDW: 14.3 % (ref 11.5–15.5)
WBC Count: 4.3 10*3/uL (ref 4.0–10.5)
nRBC: 0 % (ref 0.0–0.2)

## 2020-07-07 LAB — CMP (CANCER CENTER ONLY)
ALT: 28 U/L (ref 0–44)
AST: 19 U/L (ref 15–41)
Albumin: 4.2 g/dL (ref 3.5–5.0)
Alkaline Phosphatase: 55 U/L (ref 38–126)
Anion gap: 12 (ref 5–15)
BUN: 21 mg/dL — ABNORMAL HIGH (ref 6–20)
CO2: 20 mmol/L — ABNORMAL LOW (ref 22–32)
Calcium: 10 mg/dL (ref 8.9–10.3)
Chloride: 105 mmol/L (ref 98–111)
Creatinine: 0.84 mg/dL (ref 0.61–1.24)
GFR, Estimated: >60 mL/min (ref >60)
Glucose, Bld: 175 mg/dL — ABNORMAL HIGH (ref 70–99)
Potassium: 4.8 mmol/L (ref 3.5–5.1)
Sodium: 137 mmol/L (ref 135–145)
Total Bilirubin: 0.4 mg/dL (ref 0.3–1.2)
Total Protein: 7.5 g/dL (ref 6.5–8.1)

## 2020-07-07 LAB — MAGNESIUM: Magnesium: 1.8 mg/dL (ref 1.7–2.4)

## 2020-07-07 MED ORDER — PALONOSETRON HCL INJECTION 0.25 MG/5ML
0.2500 mg | Freq: Once | INTRAVENOUS | Status: AC
  Administered 2020-07-07: 0.25 mg via INTRAVENOUS

## 2020-07-07 MED ORDER — SODIUM CHLORIDE 0.9 % IV SOLN
600.0000 mg | Freq: Once | INTRAVENOUS | Status: AC
  Administered 2020-07-07: 600 mg via INTRAVENOUS
  Filled 2020-07-07: qty 60

## 2020-07-07 MED ORDER — SODIUM CHLORIDE 0.9 % IV SOLN
500.0000 mg/m2 | Freq: Once | INTRAVENOUS | Status: AC
  Administered 2020-07-07: 1300 mg via INTRAVENOUS
  Filled 2020-07-07: qty 40

## 2020-07-07 MED ORDER — PALONOSETRON HCL INJECTION 0.25 MG/5ML
INTRAVENOUS | Status: AC
  Filled 2020-07-07: qty 5

## 2020-07-07 MED ORDER — FOSAPREPITANT DIMEGLUMINE INJECTION 150 MG
150.0000 mg | Freq: Once | INTRAVENOUS | Status: AC
  Administered 2020-07-07: 150 mg via INTRAVENOUS
  Filled 2020-07-07: qty 150

## 2020-07-07 MED ORDER — SODIUM CHLORIDE 0.9 % IV SOLN
Freq: Once | INTRAVENOUS | Status: AC
  Filled 2020-07-07: qty 250

## 2020-07-07 MED ORDER — DEXAMETHASONE SODIUM PHOSPHATE 100 MG/10ML IJ SOLN
10.0000 mg | Freq: Once | INTRAMUSCULAR | Status: AC
  Administered 2020-07-07: 10 mg via INTRAVENOUS
  Filled 2020-07-07: qty 10

## 2020-07-07 NOTE — Patient Instructions (Signed)
[facility]  Discharge Instructions: Thank you for choosing [facility] to provide your oncology and hematology care.   If you have a lab appointment with the [facility], please go directly to the [facility] and check in at the registration area.   Wear comfortable clothing and clothing appropriate for easy access to any Portacath or PICC line.   We strive to give you quality time with your provider. You may need to reschedule your appointment if you arrive late (15 or more minutes).  Arriving late affects you and other patients whose appointments are after yours.  Also, if you miss three or more appointments without notifying the office, you may be dismissed from the clinic at the provider's discretion.      For prescription refill requests, have your pharmacy contact our office and allow 72 hours for refills to be completed.    Today you received the following chemotherapy and/or immunotherapy agents: Pemetrexed (Alimta) and Carboplatin.   To help prevent nausea and vomiting after your treatment, we encourage you to take your nausea medication as directed.  BELOW ARE SYMPTOMS THAT SHOULD BE REPORTED IMMEDIATELY: *FEVER GREATER THAN 100.4 F (38 C) OR HIGHER *CHILLS OR SWEATING *NAUSEA AND VOMITING THAT IS NOT CONTROLLED WITH YOUR NAUSEA MEDICATION *UNUSUAL SHORTNESS OF BREATH *UNUSUAL BRUISING OR BLEEDING *URINARY PROBLEMS (pain or burning when urinating, or frequent urination) *BOWEL PROBLEMS (unusual diarrhea, constipation, pain near the anus) TENDERNESS IN MOUTH AND THROAT WITH OR WITHOUT PRESENCE OF ULCERS (sore throat, sores in mouth, or a toothache) UNUSUAL RASH, SWELLING OR PAIN  UNUSUAL VAGINAL DISCHARGE OR ITCHING   Items with * indicate a potential emergency and should be followed up as soon as possible or go to the Emergency Department if any problems should occur.  Please show the CHEMOTHERAPY ALERT CARD or IMMUNOTHERAPY  ALERT CARD at check-in to the Emergency Department and triage nurse.  Should you have questions after your visit or need to cancel or reschedule your appointment, please contact [facility]  Dept: [phone]  and follow the prompts.  Office hours are 8:00 a.m. to 4:30 p.m. Monday - Friday. Please note that voicemails left after 4:00 p.m. may not be returned until the following business day.  We are closed weekends and major holidays. You have access to a nurse at all times for urgent questions. Please call the main number to the clinic Dept: [phone] and follow the prompts.   For any non-urgent questions, you may also contact your provider using MyChart. We now offer e-Visits for anyone 68 and older to request care online for non-urgent symptoms. For details visit [email or web address].   Also download the MyChart app! Go to the app store, search "MyChart", open the app, select [facility], and log in with your MyChart username and password.  Due to Covid, a mask is required upon entering the hospital/clinic. If you do not have a mask, one will be given to you upon arrival. For doctor visits, patients may have 1 support person aged 76 or older with them. For treatment visits, patients cannot have anyone with them due to current Covid guidelines and our immunocompromised population.

## 2020-07-07 NOTE — Telephone Encounter (Signed)
Return to work forms signed by Provider and sent  as requested to [employer]. Original copies picked up by Patient.

## 2020-07-09 ENCOUNTER — Other Ambulatory Visit: Payer: Self-pay | Admitting: Physician Assistant

## 2020-07-09 ENCOUNTER — Other Ambulatory Visit: Payer: Self-pay

## 2020-07-09 ENCOUNTER — Inpatient Hospital Stay: Payer: 59 | Attending: Internal Medicine

## 2020-07-09 VITALS — BP 135/75 | HR 73 | Temp 98.2°F | Resp 19 | Ht 71.0 in

## 2020-07-09 DIAGNOSIS — E860 Dehydration: Secondary | ICD-10-CM

## 2020-07-09 DIAGNOSIS — Z79899 Other long term (current) drug therapy: Secondary | ICD-10-CM | POA: Diagnosis not present

## 2020-07-09 DIAGNOSIS — C3411 Malignant neoplasm of upper lobe, right bronchus or lung: Secondary | ICD-10-CM | POA: Diagnosis present

## 2020-07-09 DIAGNOSIS — R112 Nausea with vomiting, unspecified: Secondary | ICD-10-CM

## 2020-07-09 DIAGNOSIS — C3491 Malignant neoplasm of unspecified part of right bronchus or lung: Secondary | ICD-10-CM

## 2020-07-09 MED ORDER — SODIUM CHLORIDE 0.9 % IV SOLN
Freq: Once | INTRAVENOUS | Status: AC
  Filled 2020-07-09: qty 250

## 2020-07-09 MED ORDER — ONDANSETRON HCL 4 MG/2ML IJ SOLN
8.0000 mg | Freq: Once | INTRAMUSCULAR | Status: AC
  Administered 2020-07-09: 8 mg via INTRAVENOUS

## 2020-07-09 MED ORDER — PROMETHAZINE HCL 25 MG PO TABS: 25.0000 mg | ORAL_TABLET | Freq: Three times a day (TID) | ORAL | 1 refills | Status: DC | PRN

## 2020-07-09 MED ORDER — ONDANSETRON HCL 4 MG/2ML IJ SOLN
INTRAMUSCULAR | Status: AC
  Filled 2020-07-09: qty 4

## 2020-07-09 NOTE — Patient Instructions (Signed)

## 2020-07-13 ENCOUNTER — Telehealth: Payer: Self-pay | Admitting: Physician Assistant

## 2020-07-13 NOTE — Telephone Encounter (Signed)
Scheduled per los. Called and spoke with patient. Confirmed appts  

## 2020-07-15 ENCOUNTER — Inpatient Hospital Stay: Payer: 59

## 2020-07-15 ENCOUNTER — Other Ambulatory Visit: Payer: Self-pay

## 2020-07-15 DIAGNOSIS — C3411 Malignant neoplasm of upper lobe, right bronchus or lung: Secondary | ICD-10-CM | POA: Diagnosis not present

## 2020-07-15 DIAGNOSIS — C3491 Malignant neoplasm of unspecified part of right bronchus or lung: Secondary | ICD-10-CM

## 2020-07-15 LAB — CBC WITH DIFFERENTIAL (CANCER CENTER ONLY)
Abs Immature Granulocytes: 0.03 10*3/uL (ref 0.00–0.07)
Basophils Absolute: 0 10*3/uL (ref 0.0–0.1)
Basophils Relative: 1 %
Eosinophils Absolute: 0 10*3/uL (ref 0.0–0.5)
Eosinophils Relative: 1 %
HCT: 36 % — ABNORMAL LOW (ref 39.0–52.0)
Hemoglobin: 12.7 g/dL — ABNORMAL LOW (ref 13.0–17.0)
Immature Granulocytes: 1 %
Lymphocytes Relative: 40 %
Lymphs Abs: 1.3 10*3/uL (ref 0.7–4.0)
MCH: 31.6 pg (ref 26.0–34.0)
MCHC: 35.3 g/dL (ref 30.0–36.0)
MCV: 89.6 fL (ref 80.0–100.0)
Monocytes Absolute: 0.4 10*3/uL (ref 0.1–1.0)
Monocytes Relative: 11 %
Neutro Abs: 1.6 10*3/uL — ABNORMAL LOW (ref 1.7–7.7)
Neutrophils Relative %: 46 %
Platelet Count: 165 10*3/uL (ref 150–400)
RBC: 4.02 MIL/uL — ABNORMAL LOW (ref 4.22–5.81)
RDW: 13.3 % (ref 11.5–15.5)
WBC Count: 3.3 10*3/uL — ABNORMAL LOW (ref 4.0–10.5)
nRBC: 0 % (ref 0.0–0.2)

## 2020-07-15 LAB — CMP (CANCER CENTER ONLY)
ALT: 24 U/L (ref 0–44)
AST: 19 U/L (ref 15–41)
Albumin: 3.9 g/dL (ref 3.5–5.0)
Alkaline Phosphatase: 52 U/L (ref 38–126)
Anion gap: 10 (ref 5–15)
BUN: 21 mg/dL — ABNORMAL HIGH (ref 6–20)
CO2: 28 mmol/L (ref 22–32)
Calcium: 9.1 mg/dL (ref 8.9–10.3)
Chloride: 101 mmol/L (ref 98–111)
Creatinine: 0.81 mg/dL (ref 0.61–1.24)
GFR, Estimated: >60 mL/min (ref >60)
Glucose, Bld: 92 mg/dL (ref 70–99)
Potassium: 3.8 mmol/L (ref 3.5–5.1)
Sodium: 139 mmol/L (ref 135–145)
Total Bilirubin: 0.3 mg/dL (ref 0.3–1.2)
Total Protein: 6.8 g/dL (ref 6.5–8.1)

## 2020-07-15 LAB — MAGNESIUM: Magnesium: 1.6 mg/dL — ABNORMAL LOW (ref 1.7–2.4)

## 2020-07-22 ENCOUNTER — Inpatient Hospital Stay: Payer: 59

## 2020-07-22 ENCOUNTER — Other Ambulatory Visit: Payer: Self-pay

## 2020-07-22 DIAGNOSIS — C3491 Malignant neoplasm of unspecified part of right bronchus or lung: Secondary | ICD-10-CM

## 2020-07-22 DIAGNOSIS — C3411 Malignant neoplasm of upper lobe, right bronchus or lung: Secondary | ICD-10-CM | POA: Diagnosis not present

## 2020-07-22 LAB — CMP (CANCER CENTER ONLY)
ALT: 39 U/L (ref 0–44)
AST: 30 U/L (ref 15–41)
Albumin: 3.9 g/dL (ref 3.5–5.0)
Alkaline Phosphatase: 52 U/L (ref 38–126)
Anion gap: 8 (ref 5–15)
BUN: 11 mg/dL (ref 6–20)
CO2: 28 mmol/L (ref 22–32)
Calcium: 9.4 mg/dL (ref 8.9–10.3)
Chloride: 105 mmol/L (ref 98–111)
Creatinine: 0.83 mg/dL (ref 0.61–1.24)
GFR, Estimated: >60 mL/min (ref >60)
Glucose, Bld: 117 mg/dL — ABNORMAL HIGH (ref 70–99)
Potassium: 4.6 mmol/L (ref 3.5–5.1)
Sodium: 141 mmol/L (ref 135–145)
Total Bilirubin: 0.3 mg/dL (ref 0.3–1.2)
Total Protein: 6.6 g/dL (ref 6.5–8.1)

## 2020-07-22 LAB — CBC WITH DIFFERENTIAL (CANCER CENTER ONLY)
Abs Immature Granulocytes: 0.02 10*3/uL (ref 0.00–0.07)
Basophils Absolute: 0 10*3/uL (ref 0.0–0.1)
Basophils Relative: 0 %
Eosinophils Absolute: 0 10*3/uL (ref 0.0–0.5)
Eosinophils Relative: 1 %
HCT: 33.4 % — ABNORMAL LOW (ref 39.0–52.0)
Hemoglobin: 11.5 g/dL — ABNORMAL LOW (ref 13.0–17.0)
Immature Granulocytes: 1 %
Lymphocytes Relative: 30 %
Lymphs Abs: 0.8 10*3/uL (ref 0.7–4.0)
MCH: 31.8 pg (ref 26.0–34.0)
MCHC: 34.4 g/dL (ref 30.0–36.0)
MCV: 92.3 fL (ref 80.0–100.0)
Monocytes Absolute: 0.5 10*3/uL (ref 0.1–1.0)
Monocytes Relative: 19 %
Neutro Abs: 1.3 10*3/uL — ABNORMAL LOW (ref 1.7–7.7)
Neutrophils Relative %: 49 %
Platelet Count: 154 10*3/uL (ref 150–400)
RBC: 3.62 MIL/uL — ABNORMAL LOW (ref 4.22–5.81)
RDW: 15.1 % (ref 11.5–15.5)
WBC Count: 2.7 10*3/uL — ABNORMAL LOW (ref 4.0–10.5)
nRBC: 0 % (ref 0.0–0.2)

## 2020-07-22 LAB — MAGNESIUM: Magnesium: 1.8 mg/dL (ref 1.7–2.4)

## 2020-07-29 ENCOUNTER — Inpatient Hospital Stay: Payer: 59

## 2020-07-29 ENCOUNTER — Other Ambulatory Visit: Payer: Self-pay

## 2020-07-29 DIAGNOSIS — E860 Dehydration: Secondary | ICD-10-CM

## 2020-07-29 DIAGNOSIS — C3411 Malignant neoplasm of upper lobe, right bronchus or lung: Secondary | ICD-10-CM | POA: Diagnosis not present

## 2020-07-29 LAB — CBC WITH DIFFERENTIAL (CANCER CENTER ONLY)
Abs Immature Granulocytes: 0.02 K/uL (ref 0.00–0.07)
Basophils Absolute: 0 K/uL (ref 0.0–0.1)
Basophils Relative: 1 %
Eosinophils Absolute: 0 K/uL (ref 0.0–0.5)
Eosinophils Relative: 1 %
HCT: 35.2 % — ABNORMAL LOW (ref 39.0–52.0)
Hemoglobin: 12.1 g/dL — ABNORMAL LOW (ref 13.0–17.0)
Immature Granulocytes: 1 %
Lymphocytes Relative: 30 %
Lymphs Abs: 1 K/uL (ref 0.7–4.0)
MCH: 32.4 pg (ref 26.0–34.0)
MCHC: 34.4 g/dL (ref 30.0–36.0)
MCV: 94.1 fL (ref 80.0–100.0)
Monocytes Absolute: 0.8 K/uL (ref 0.1–1.0)
Monocytes Relative: 26 %
Neutro Abs: 1.4 K/uL — ABNORMAL LOW (ref 1.7–7.7)
Neutrophils Relative %: 41 %
Platelet Count: 248 K/uL (ref 150–400)
RBC: 3.74 MIL/uL — ABNORMAL LOW (ref 4.22–5.81)
RDW: 15.3 % (ref 11.5–15.5)
WBC Count: 3.2 K/uL — ABNORMAL LOW (ref 4.0–10.5)
nRBC: 0 % (ref 0.0–0.2)

## 2020-07-29 LAB — MAGNESIUM: Magnesium: 2 mg/dL (ref 1.7–2.4)

## 2020-07-29 LAB — CMP (CANCER CENTER ONLY)
ALT: 55 U/L — ABNORMAL HIGH (ref 0–44)
AST: 40 U/L (ref 15–41)
Albumin: 4.6 g/dL (ref 3.5–5.0)
Alkaline Phosphatase: 44 U/L (ref 38–126)
Anion gap: 11 (ref 5–15)
BUN: 14 mg/dL (ref 6–20)
CO2: 26 mmol/L (ref 22–32)
Calcium: 9.4 mg/dL (ref 8.9–10.3)
Chloride: 105 mmol/L (ref 98–111)
Creatinine: 0.84 mg/dL (ref 0.61–1.24)
GFR, Estimated: >60 mL/min (ref >60)
Glucose, Bld: 102 mg/dL — ABNORMAL HIGH (ref 70–99)
Potassium: 4.4 mmol/L (ref 3.5–5.1)
Sodium: 142 mmol/L (ref 135–145)
Total Bilirubin: 0.7 mg/dL (ref 0.3–1.2)
Total Protein: 7 g/dL (ref 6.5–8.1)

## 2020-08-04 NOTE — Progress Notes (Signed)
[name] OFFICE PROGRESS NOTE  [name], NP [name]. [address] [place] [address]  DIAGNOSIS: Stage IIA (T2b, N0, M0) non-small cell lung cancer, adenocarcinoma presented with right upper lobe lung mass diagnosed in February 2022.   Biomarker Findings Microsatellite status - MS-Stable Tumor Mutational Burden - 4 Muts/Mb Genomic Findings For a complete list of the genes assayed, please refer to the Appendix. KRAS G12D KEAP1 P278S 7 Disease relevant genes with no reportable alterations: ALK, BRAF, EGFR, ERBB2, MET, RET, ROS1   PDL1 Expression 5%.  PRIOR THERAPY:  1) Status post robotic assisted right video thoracoscopy with right upper lobectomy and mediastinal lymph node sampling under the care of Dr. Kipp Brood on March 28, 2020.  2) Adjuvant systemic chemotherapy with cisplatin 75 Mg/M2 and Alimta 500 Mg/M2 every 3 weeks. Status pose 4 cycles. last dose on 07/07/20. Cisplatin was discontined due to uncontrolled nausea/vomiting. Starting from cycle #3, he was  on carboplatin for an AUC of 5 and Alimta 500 mg/m2.   CURRENT THERAPY: Observation   INTERVAL HISTORY: Jerry [name] 45 y.o. male returns to the clinic today for a follow-up visit accompanied by his wife.  The patient completed 4 cycles of adjuvant systemic chemotherapy.  He had a challenging time with treatment secondary to significant decreased appetite, nausea/vomiting, fatigue, and generalized weakness.  His last dose of treatment was on 07/07/2020.  He reports that he mostly experiences the symptoms approximately 10 days following treatment.  His symptoms are resolved at this time. He started going back to work in July 2022.   He denies any recent fever, chills, or night sweats.  He gained back about 14 labs since his appointment 1 month ago. Denies leg swelling. His fingers are a little swollen. He reports his breathing is stable and he denies any current chest pain, shortness of breath, or  hemoptysis.  Denies cough.  He denies any diarrhea or constipation.  He denies any headache or visual changes.  The patient recently had a restaging CT scan performed.  The patient is here today for evaluation to review his scan results.     MEDICAL HISTORY: Past Medical History:  Diagnosis Date   Cancer [facility])    Disorder due to vaping    Former smoker    Pneumonia     ALLERGIES:  is allergic to olanzapine.  MEDICATIONS:  Current Outpatient Medications  Medication Sig Dispense Refill   folic acid (FOLVITE) 1 MG tablet Take 1 tablet (1 mg total) by mouth daily. 30 tablet 4   magnesium oxide (MAG-OX) 400 MG tablet Take 1 tablet by mouth daily.     No current facility-administered medications for this visit.   Facility-Administered Medications Ordered in Other Visits  Medication Dose Route Frequency Provider Last Rate Last Admin   0.9 %  sodium chloride infusion   Intravenous Continuous Curt Bears, MD       0.9 %  sodium chloride infusion   Intravenous Continuous Curt Bears, MD 900 mL/hr at 05/11/20 0940 New Bag at 05/11/20 0940   ondansetron (ZOFRAN) injection 8 mg  8 mg Intravenous Once Ardeen Garland, RN        SURGICAL HISTORY:  Past Surgical History:  Procedure Laterality Date   INTERCOSTAL NERVE BLOCK Right 03/28/2020   Procedure: INTERCOSTAL NERVE BLOCK;  Surgeon: [name], MD;  Location: [facility];  Service: Thoracic;  Laterality: Right;   LUNG LOBECTOMY Right 03/30/2020   LYMPH NODE DISSECTION Right 03/28/2020  Procedure: LYMPH NODE DISSECTION;  Surgeon: [name], MD;  Location: [facility];  Service: Thoracic;  Laterality: Right;   VIDEO BRONCHOSCOPY WITH ENDOBRONCHIAL NAVIGATION Right 03/02/2020   Procedure: VIDEO BRONCHOSCOPY WITH ENDOBRONCHIAL NAVIGATION;  Surgeon: Garner Nash, DO;  Location: [facility];  Service: Pulmonary;  Laterality: Right;   VIDEO BRONCHOSCOPY WITH ENDOBRONCHIAL ULTRASOUND Bilateral 03/02/2020   Procedure: VIDEO  BRONCHOSCOPY WITH ENDOBRONCHIAL ULTRASOUND;  Surgeon: Garner Nash, DO;  Location: [facility];  Service: Pulmonary;  Laterality: Bilateral;   WISDOM TOOTH EXTRACTION      REVIEW OF SYSTEMS:   Review of Systems  Constitutional: Positive for weight gain. Negative for appetite change, chills, fatigue, fever and unexpected weight change.  HENT: Negative for mouth sores, nosebleeds, sore throat and trouble swallowing.   Eyes: Negative for eye problems and icterus.  Respiratory: Negative for cough, hemoptysis, shortness of breath and wheezing.   Cardiovascular: Negative for chest pain and leg swelling.  Gastrointestinal: Negative for abdominal pain, constipation, diarrhea, nausea and vomiting.  Genitourinary: Negative for bladder incontinence, difficulty urinating, dysuria, frequency and hematuria.   Musculoskeletal: Negative for back pain, gait problem, neck pain and neck stiffness.  Skin: Positive for mild bilateral finger swelling. Negative for itching and rash.  Neurological: Negative for dizziness, extremity weakness, gait problem, headaches, light-headedness and seizures.  Hematological: Negative for adenopathy. Does not bruise/bleed easily.  Psychiatric/Behavioral: Negative for confusion, depression and sleep disturbance. The patient is not nervous/anxious.     PHYSICAL EXAMINATION:  Blood pressure (!) 146/82, pulse 66, temperature 98 F (36.7 C), temperature source Oral, resp. rate 17, weight 299 lb 12.8 oz (136 kg), SpO2 100 %.  ECOG PERFORMANCE STATUS:  0  Physical Exam  Constitutional: Oriented to person, place, and time and well-developed, well-nourished, and in no distress. HENT:  Head: Normocephalic and atraumatic.  Mouth/Throat: Oropharynx is clear and moist. No oropharyngeal exudate.  Eyes: Conjunctivae are normal. Right eye exhibits no discharge. Left eye exhibits no discharge. No scleral icterus.  Neck: Normal range of motion. Neck supple.  Cardiovascular: Normal rate,  regular rhythm, normal heart sounds and intact distal pulses.   Pulmonary/Chest: Effort normal and breath sounds normal. No respiratory distress. No wheezes. No rales.  Abdominal: Soft. Bowel sounds are normal. Exhibits no distension and no mass. There is no tenderness.  Musculoskeletal: Normal range of motion. Exhibits no edema.  Lymphadenopathy:    No cervical adenopathy.  Neurological: Alert and oriented to person, place, and time. Exhibits normal muscle tone. Gait normal. Coordination normal.  Skin: Skin is warm and dry. No rash noted. Not diaphoretic. No erythema. No pallor.  Psychiatric: Mood, memory and judgment normal.  Vitals reviewed.  LABORATORY DATA: Lab Results  Component Value Date   WBC 3.2 (L) 07/29/2020   HGB 12.1 (L) 07/29/2020   HCT 35.2 (L) 07/29/2020   MCV 94.1 07/29/2020   PLT 248 07/29/2020      Chemistry      Component Value Date/Time   NA 142 07/29/2020 0830   NA 142 02/22/2020 0948   K 4.4 07/29/2020 0830   CL 105 07/29/2020 0830   CO2 26 07/29/2020 0830   BUN 14 07/29/2020 0830   BUN 11 02/22/2020 0948   CREATININE 0.84 07/29/2020 0830      Component Value Date/Time   CALCIUM 9.4 07/29/2020 0830   ALKPHOS 44 07/29/2020 0830   AST 40 07/29/2020 0830   ALT 55 (H) 07/29/2020 0830   BILITOT 0.7 07/29/2020 0830  RADIOGRAPHIC STUDIES:  CT Chest W Contrast  Result Date: 08/08/2020 CLINICAL DATA:  45 year old male with history of non-small cell lung cancer (adenocarcinoma) status post right upper lobectomy and chemotherapy. Evaluate treatment response. EXAM: CT CHEST WITH CONTRAST TECHNIQUE: Multidetector CT imaging of the chest was performed during intravenous contrast administration. CONTRAST:  46m OMNIPAQUE IOHEXOL 350 MG/ML SOLN COMPARISON:  Chest CT 03/01/2020. FINDINGS: Cardiovascular: Heart size is normal. There is no significant pericardial fluid, thickening or pericardial calcification. There is aortic atherosclerosis, as well as  atherosclerosis of the great vessels of the mediastinum and the coronary arteries, including calcified atherosclerotic plaque in the left anterior descending coronary artery. Mediastinum/Nodes: No pathologically enlarged mediastinal or hilar lymph nodes. Esophagus is unremarkable in appearance. No axillary lymphadenopathy. Lungs/Pleura: Status post right upper lobectomy. Compensatory hyperexpansion of the right middle and lower lobes. No definite suspicious appearing pulmonary nodules or masses are noted. No acute consolidative airspace disease. No pleural effusions. Upper Abdomen: Mild diffuse low attenuation throughout the visualized hepatic parenchyma suggestive of hepatic steatosis (difficult to say for certain on today's contrast enhanced CT examination). Musculoskeletal: There are no aggressive appearing lytic or blastic lesions noted in the visualized portions of the skeleton. IMPRESSION: 1. Status post right upper lobectomy. No findings to suggest recurrent or metastatic disease in the thorax. 2. Aortic atherosclerosis, in addition to left anterior descending coronary artery disease. Please note that although the presence of coronary artery calcium documents the presence of coronary artery disease, the severity of this disease and any potential stenosis cannot be assessed on this non-gated CT examination. Assessment for potential risk factor modification, dietary therapy or pharmacologic therapy may be warranted, if clinically indicated. 3. Probable mild hepatic steatosis. Aortic Atherosclerosis (ICD10-I70.0). Electronically Signed   By: DVinnie LangtonM.D.   On: 08/08/2020 07:58     ASSESSMENT/PLAN:  This is a very pleasant 45year old Caucasian male diagnosed with stage IIa (T2b, N0, M0) non-small cell lung cancer, adenocarcinoma.  He was diagnosed in March 2022.  His PD-L1 expression is 5%.  He has no actionable mutations.   The patient is status post a right upper lobectomy with lymph node  sampling under the care of Dr. LKipp Broodwhich was performed on March 28, 2020.   The patient completed adjuvant cisplatin 75 mg per metered squared and Alimta 500 mg per metered squared.  He is status post 4 cycles.  He had a challenging time with treatment. Starting from cycle #3, the patient's cisplatin was changed to carboplatin. Last dose 07/07/20.    The patient recently had a restaging CT scan performed.  The patient was seen with Dr. MJulien Nordmanntoday. Dr. MJulien Nordmannpersonally and independently reviewed the scan results and discussed the results with the patient. The scan did not show evidence for disease progression. Dr. MJulien Nordmannrecommends that he continue on observation with a restaging CT scan in 4 months.  We will see him back for follow-up visit at that time for evaluation to review his scan results.  His scan incidentally noted atherosclerosis and mild hepatic steatosis. Dr. MJulien Nordmannencouraged the patient to have routine follow ups with his PCP for health maintenance and encouraged healthy lifestyle habits such as diet and exercise.   The patient was advised to call immediately if he has any concerning symptoms in the interval. The patient voices understanding of current disease status and treatment options and is in agreement with the current care plan. All questions were answered. The patient knows to call the clinic with any problems,  questions or concerns. We can certainly see the patient much sooner if necessary        Orders Placed This Encounter  Procedures   CT Chest W Contrast    Standing Status:   Future    Standing Expiration Date:   08/08/2021    Order Specific Question:   If indicated for the ordered procedure, I authorize the administration of contrast media per Radiology protocol    Answer:   Yes    Order Specific Question:   Preferred imaging location?    Answer:   [facility]   CBC with Differential ([place] Only)    Standing Status:   Future     Standing Expiration Date:   08/08/2021   CMP ([place] only)    Standing Status:   Future    Standing Expiration Date:   08/08/2021      Jerry Sos Brennan Karam, PA-C 08/08/20  ADDENDUM: Hematology/Oncology Attending: I had a face-to-face encounter with the patient today.  I reviewed his record, labs and scans and recommended his care plan.  This is a very pleasant 45 years old white male diagnosed with stage IIa (T2b, N0, M0) non-small cell lung cancer, adenocarcinoma in March 2022 with PD-L1 expression of 5% and no actionable mutation.  He is status post right upper lobectomy with lymph node dissection on March 28, 2020.  The patient underwent adjuvant systemic chemotherapy initially with cisplatin and Alimta for 2 cycles but he could not tolerate the systemic chemotherapy and he switched to carboplatin and Alimta for the last 2 cycles of his treatment.  He tolerated his treatment fairly well but he has a rough time with delayed nausea and vomiting requiring IV hydration and IV Zofran. The patient is here today for evaluation with repeat CT scan of the chest for restaging of his disease. I personally and independently reviewed the scans and discussed the results with the patient and his wife. His scan showed no concerning findings for disease recurrence or metastasis. I recommended for the patient to continue on observation and close monitoring. We will arrange for the patient to have repeat CT scan of the chest in 3 months. The patient is very active and he is currently back to work. Regarding the coronary atherosclerosis, he was advised to follow-up with his primary care physician for recommendation, dietary modification as well as treatment for any dyslipidemia. The patient was advised to call immediately if he has any concerning symptoms in the interval.  Disclaimer: This note was dictated with voice recognition software. Similar sounding words can inadvertently be transcribed and may  be missed upon review. Eilleen Kempf, MD 08/08/20

## 2020-08-05 ENCOUNTER — Ambulatory Visit (HOSPITAL_COMMUNITY)
Admission: RE | Admit: 2020-08-05 | Discharge: 2020-08-05 | Disposition: A | Discharge status: Home / Self Care | Payer: 59 | Source: Ambulatory Visit | Attending: Physician Assistant | Admitting: Physician Assistant

## 2020-08-05 ENCOUNTER — Encounter (HOSPITAL_COMMUNITY): Payer: Self-pay

## 2020-08-05 ENCOUNTER — Other Ambulatory Visit: Payer: Self-pay

## 2020-08-05 DIAGNOSIS — I700 Atherosclerosis of aorta: Secondary | ICD-10-CM

## 2020-08-05 DIAGNOSIS — C3491 Malignant neoplasm of unspecified part of right bronchus or lung: Secondary | ICD-10-CM

## 2020-08-05 DIAGNOSIS — I2510 Atherosclerotic heart disease of native coronary artery without angina pectoris: Secondary | ICD-10-CM

## 2020-08-05 HISTORY — DX: Atherosclerotic heart disease of native coronary artery without angina pectoris: I25.10

## 2020-08-05 HISTORY — DX: Atherosclerosis of aorta: I70.0

## 2020-08-05 MED ORDER — IOHEXOL 350 MG/ML SOLN
75.0000 mL | Freq: Once | INTRAVENOUS | Status: AC | PRN
  Administered 2020-08-05: 75 mL via INTRAVENOUS

## 2020-08-08 ENCOUNTER — Inpatient Hospital Stay: Payer: 59 | Attending: Internal Medicine | Admitting: Physician Assistant

## 2020-08-08 ENCOUNTER — Other Ambulatory Visit: Payer: Self-pay

## 2020-08-08 ENCOUNTER — Encounter: Payer: Self-pay | Admitting: Physician Assistant

## 2020-08-08 VITALS — BP 146/82 | HR 66 | Temp 98.0°F | Resp 17 | Wt 299.8 lb

## 2020-08-08 DIAGNOSIS — Z9221 Personal history of antineoplastic chemotherapy: Secondary | ICD-10-CM | POA: Diagnosis not present

## 2020-08-08 DIAGNOSIS — C3491 Malignant neoplasm of unspecified part of right bronchus or lung: Secondary | ICD-10-CM

## 2020-08-08 DIAGNOSIS — C3411 Malignant neoplasm of upper lobe, right bronchus or lung: Secondary | ICD-10-CM | POA: Insufficient documentation

## 2020-08-08 DIAGNOSIS — Z902 Acquired absence of lung [part of]: Secondary | ICD-10-CM | POA: Insufficient documentation

## 2020-08-08 DIAGNOSIS — Z87891 Personal history of nicotine dependence: Secondary | ICD-10-CM | POA: Diagnosis not present

## 2020-08-08 DIAGNOSIS — I2510 Atherosclerotic heart disease of native coronary artery without angina pectoris: Secondary | ICD-10-CM | POA: Diagnosis not present

## 2020-08-10 ENCOUNTER — Telehealth: Payer: Self-pay | Admitting: Physician Assistant

## 2020-08-10 NOTE — Telephone Encounter (Signed)
Scheduled per los. Called and left msg. Mailed printout  °

## 2020-08-22 NOTE — Progress Notes (Signed)
Subjective:  Patient ID: Jerry [name], male    DOB: 1975-02-21  Age: 45 y.o. MRN: [id number]  CC: S/P Right lobectomy Aortic atherosclerosis  HPI Jerry [name] is a 45 year old Caucasian male that presents for routine follow-up after right upper lobectomy for lung cancer. He has completed chemotherapy and returned to work full-time. Chest CT on 08/05/20 reveals no cancer but aortic atherosclerosis, left anterior descending CAD, and probable hepatic steatosis. He is not currently on statin therapy or followed by cardiology. He denies chest pain. He admits to some mild dyspnea with physical activity. Jerry [name] tells me that he is able to walk on a treadmill with an incline for 1-hour regularly without difficulty. Jerry [name] states he is experiencing mild insomnia and nocturia. He is not currently taking any treatments. He also tells me that he has some intermittent bilateral hand numbness when making a fist. He states he was told numbness was side effect of chemotherapy.   Current Outpatient Medications on File Prior to Visit  Medication Sig Dispense Refill   folic acid (FOLVITE) 1 MG tablet Take 1 tablet (1 mg total) by mouth daily. 30 tablet 4   magnesium oxide (MAG-OX) 400 MG tablet Take 1 tablet by mouth daily.     Current Facility-Administered Medications on File Prior to Visit  Medication Dose Route Frequency Provider Last Rate Last Admin   0.9 %  sodium chloride infusion   Intravenous Continuous Curt Bears, MD       0.9 %  sodium chloride infusion   Intravenous Continuous Curt Bears, MD 900 mL/hr at 05/11/20 0940 New Bag at 05/11/20 0940   ondansetron (ZOFRAN) injection 8 mg  8 mg Intravenous Once Ardeen Garland, RN       Past Medical History:  Diagnosis Date   Cancer [facility])    Disorder due to vaping    Former smoker    Pneumonia    Past Surgical History:  Procedure Laterality Date   INTERCOSTAL NERVE BLOCK Right 03/28/2020   Procedure: INTERCOSTAL NERVE BLOCK;  Surgeon: [name], MD;  Location: [facility];  Service: Thoracic;  Laterality: Right;   LUNG LOBECTOMY Right 03/30/2020   LYMPH NODE DISSECTION Right 03/28/2020   Procedure: LYMPH NODE DISSECTION;  Surgeon: [name], MD;  Location: [facility];  Service: Thoracic;  Laterality: Right;   VIDEO BRONCHOSCOPY WITH ENDOBRONCHIAL NAVIGATION Right 03/02/2020   Procedure: VIDEO BRONCHOSCOPY WITH ENDOBRONCHIAL NAVIGATION;  Surgeon: Garner Nash, DO;  Location: [facility];  Service: Pulmonary;  Laterality: Right;   VIDEO BRONCHOSCOPY WITH ENDOBRONCHIAL ULTRASOUND Bilateral 03/02/2020   Procedure: VIDEO BRONCHOSCOPY WITH ENDOBRONCHIAL ULTRASOUND;  Surgeon: Garner Nash, DO;  Location: [facility];  Service: Pulmonary;  Laterality: Bilateral;   WISDOM TOOTH EXTRACTION      Family History  Problem Relation Age of Onset   Stroke Father    Hypertension Father    Hyperlipidemia Father    Arrhythmia Father    Lung cancer Paternal Aunt    Social History   Socioeconomic History   Marital status: Married    Spouse name: Not on file   Number of children: 6   Years of education: Not on file   Highest education level: Not on file  Occupational History   Occupation: Clinical biochemist  Tobacco Use   Smoking status: Former    Packs/day: 1.00    Years: 17.00    Pack years: 17.00    Types: Cigarettes    Start date: 03/01/1991    Quit date: 02/29/2008  Years since quitting: 12.4   Smokeless tobacco: Never  Vaping Use   Vaping Use: Former   Start date: 02/08/2013   Quit date: 02/29/2020   Substances: Nicotine  Substance and Sexual Activity   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   Drug use: Never   Sexual activity: Yes    Partners: Female  Other Topics Concern   Not on file  Social History Narrative   Not on file   Social Determinants of Health   Financial Resource Strain: Not on file  Food Insecurity: Not on file  Transportation Needs: Not on file  Physical Activity: Not on file  Stress: Not  on file  Social Connections: Not on file    Review of Systems  Constitutional:  Negative for appetite change, fatigue and fever.  HENT:  Negative for congestion, ear pain and sore throat.   Eyes: Negative.   Respiratory:  Negative for cough and shortness of breath.   Cardiovascular:  Positive for leg swelling (mild edema to bilateral ankles after working). Negative for chest pain.  Gastrointestinal:  Negative for abdominal pain, constipation, diarrhea, nausea and vomiting.  Endocrine: Negative.   Genitourinary:  Negative for dysuria and frequency.  Musculoskeletal:  Negative for arthralgias and myalgias.  Skin: Negative.   Allergic/Immunologic: Negative.   Neurological:  Positive for numbness (bilateral hands when making fist). Negative for dizziness and headaches.  Hematological: Negative.   Psychiatric/Behavioral:  Positive for sleep disturbance (insomnia). Negative for dysphoric mood. The patient is not nervous/anxious.     Objective:  There were no vitals taken for this visit.  BP/Weight 08/08/2020 07/09/2020 3/66/4403  Systolic BP 474 259 563  Diastolic BP 82 75 91  Wt. (Lbs) 299.8 - -  BMI 41.81 39.64 -    Physical Exam Vitals reviewed.  Constitutional:      Appearance: Normal appearance.  HENT:     Right Ear: Tympanic membrane and external ear normal.     Left Ear: Tympanic membrane and external ear normal.     Nose: Nose normal.     Mouth/Throat:     Mouth: Mucous membranes are moist.  Eyes:     Pupils: Pupils are equal, round, and reactive to light.  Cardiovascular:     Rate and Rhythm: Normal rate and regular rhythm.     Pulses: Normal pulses.     Heart sounds: Normal heart sounds.  Pulmonary:     Effort: Pulmonary effort is normal.     Breath sounds: Normal breath sounds.  Abdominal:     General: Bowel sounds are normal.     Palpations: Abdomen is soft.  Musculoskeletal:        General: Normal range of motion.     Cervical back: Normal range of motion.   Skin:    General: Skin is warm and dry.     Capillary Refill: Capillary refill takes less than 2 seconds.  Neurological:     General: No focal deficit present.     Mental Status: He is alert and oriented to person, place, and time.  Psychiatric:        Mood and Affect: Mood normal.        Behavior: Behavior normal.     Lab Results  Component Value Date   WBC 3.2 (L) 07/29/2020   HGB 12.1 (L) 07/29/2020   HCT 35.2 (L) 07/29/2020   PLT 248 07/29/2020   GLUCOSE 102 (H) 07/29/2020   CHOL 192 02/22/2020  TRIG 81 02/22/2020   HDL 44 02/22/2020   LDLCALC 133 (H) 02/22/2020   ALT 55 (H) 07/29/2020   AST 40 07/29/2020   NA 142 07/29/2020   K 4.4 07/29/2020   CL 105 07/29/2020   CREATININE 0.84 07/29/2020   BUN 14 07/29/2020   CO2 26 07/29/2020   TSH 0.509 02/22/2020   INR 1.0 03/24/2020      Assessment & Plan:     1. Aortic atherosclerosis (HCC) - CBC with Differential/Platelet - Comprehensive metabolic panel - Lipid panel - TSH -will begin statin therapy and refer to cardiology after lab results  2. Coronary artery disease involving native coronary artery of native heart without angina pectoris - CBC with Differential/Platelet - Comprehensive metabolic panel - Lipid panel -heart healthy diet -continue physical activity  3. S/P lobectomy of lung - CBC with Differential/Platelet - Comprehensive metabolic panel - Lipid panel - TSH  4. History of lung cancer -follow-up with oncology as scheduled  5. BMI 40.0-44.9, adult (HCC) - CBC with Differential/Platelet - Comprehensive metabolic panel - Lipid panel - TSH  6. S/P chemotherapy, time since 4-12 weeks - CBC with Differential/Platelet - Comprehensive metabolic panel - Lipid panel - TSH    Recommend routine eye and dental exam Continue heart healthy diet and exercise Recommend compression socks while working We will call you with lab results Follow-up 64-months, fasting     Follow-up: 72-months,  fasting  An After Visit Summary was printed and given to the patient.  I,Lauren M Auman,acting as a Education administrator for [employer], NP.,have documented all relevant documentation on the behalf of [name], NP,as directed by  [name], NP while in the presence of [name], NP.   I, [name], NP, have reviewed all documentation for this visit. The documentation on 08/23/20 for the exam, diagnosis, procedures, and orders are all accurate and complete.   [name], NP [place] [phone]

## 2020-08-23 ENCOUNTER — Other Ambulatory Visit: Payer: Self-pay

## 2020-08-23 ENCOUNTER — Ambulatory Visit (INDEPENDENT_AMBULATORY_CARE_PROVIDER_SITE_OTHER): Payer: 59 | Admitting: Nurse Practitioner

## 2020-08-23 ENCOUNTER — Encounter: Payer: Self-pay | Admitting: Nurse Practitioner

## 2020-08-23 VITALS — BP 132/60 | HR 67 | Temp 97.9°F | Resp 18 | Ht 71.0 in | Wt 299.0 lb

## 2020-08-23 DIAGNOSIS — Z6841 Body Mass Index (BMI) 40.0 and over, adult: Secondary | ICD-10-CM

## 2020-08-23 DIAGNOSIS — Z902 Acquired absence of lung [part of]: Secondary | ICD-10-CM

## 2020-08-23 DIAGNOSIS — Z85118 Personal history of other malignant neoplasm of bronchus and lung: Secondary | ICD-10-CM

## 2020-08-23 DIAGNOSIS — I700 Atherosclerosis of aorta: Secondary | ICD-10-CM | POA: Diagnosis not present

## 2020-08-23 DIAGNOSIS — I2510 Atherosclerotic heart disease of native coronary artery without angina pectoris: Secondary | ICD-10-CM | POA: Diagnosis not present

## 2020-08-23 DIAGNOSIS — Z9221 Personal history of antineoplastic chemotherapy: Secondary | ICD-10-CM

## 2020-08-23 NOTE — Patient Instructions (Addendum)
Recommend routine eye and dental exam Continue heart healthy diet and exercise Recommend compression socks while working We will call you with lab results Follow-up 65-months, fasting  Atherosclerosis  Atherosclerosis is narrowing and hardening of the arteries. Arteries are blood vessels that carry blood from the heart to all parts of the body. This blood contains oxygen. Arteries can become narrow or blocked from inflammation or from a buildup of fat, cholesterol, calcium, and other substances (plaque). Plaque decreases the amount of blood that can flow through the artery. Atherosclerosis can affect any artery in your body, including: Heart arteries. Damage to these arteries may lead to coronary artery disease, which can cause a heart attack. Brain arteries. Damage to these arteries may cause a stroke. Leg, arm, and pelvis arteries. Peripheral artery disease (PAD) may result from damage to these arteries. Kidney arteries. Kidney (renal) failure may result from damage to kidney arteries. Treatment may slow the disease and prevent further damage to your heart, brain,peripheral arteries, and kidneys. What are the causes? This condition develops slowly over many years. The inner layers of your arteries become damaged and allow the gradual buildup of plaque. The exact cause of atherosclerosis is not fully understood. Symptoms of atherosclerosisdo not occur until an artery becomes narrow or blocked. What increases the risk? The following factors may make you more likely to develop this condition: Being middle-aged or older. Certain medical conditions, including: High blood pressure. High cholesterol. High blood fats (triglycerides). Diabetes. Sleep apnea. A family history of atherosclerosis. Being overweight. Using products that contain tobacco or nicotine. Not exercising enough (sedentary lifestyle). Having a substance in your blood called C-reactive protein (CRP). This is a sign of  increased levels of inflammation in your body. Being stressed. Drinking too much alcohol or using drugs, such as cocaine or methamphetamine. What are the signs or symptoms? Symptoms of atherosclerosis may not be obvious until there is damage to an area of your body that is not getting enough blood. Sometimes, atherosclerosis doesnot cause symptoms. Symptoms of this condition include: Coronary artery disease. This may cause chest pain and shortness of breath. Decreased blood supply to your brain, which may cause a stroke. Signs of a stroke may include sudden: Weakness or numbness in your face, arm, or leg, especially on one side of your body. Trouble walking or difficulty moving your arms or legs. Loss of balance or coordination. Confusion. Slurred speech (dysarthria). Trouble speaking, or trouble understanding speech, or both (aphasia). Vision changes in one or both eyes. This may be double vision, blurred vision, or loss of vision. Severe headache with no known cause. The headache is often described as the worst headache ever experienced. PAD, which may cause pain and numbness, often in your legs and hips. Renal failure. This may cause tiredness, problems with urination, swelling, and itchy skin. How is this diagnosed? This condition is diagnosed based on your medical history and a physical exam. During the exam, your health care provider will: Check your pulse in different places. Listen for a "whooshing" sound over your arteries (bruit). You may also have tests, such as: Blood tests to check your levels of cholesterol, triglycerides, and CRP. Electrocardiogram (ECG) to check for heart damage. Chest X-ray to see if you have an enlarged heart, which is a sign of heart failure. Stress test to see how your heart reacts to exercise. Echocardiogram to get images of the inside of your heart. Ankle-brachial index to compare blood pressure in your arms to blood pressure in your  ankles. Ultrasound of your peripheral arteries to check blood flow. CT scan to check for damage to your heart or brain. X-rays of blood vessels after dye has been injected (angiogram) to check blood flow. How is this treated? This condition is treated with lifestyle changes as the first step. These may include: Changing your diet. Losing weight. Reducing stress. Exercising and being physically active more regularly. Quitting smoking. You may also need medicine to: Lower triglycerides and cholesterol. Control blood pressure. Prevent blood clots. Lower inflammation in your body. Control your blood sugar. Sometimes, surgery is needed to: Remove plaque from an artery (endarterectomy). Open or widen a narrowed heart artery (angioplasty). Create a new path for your blood with one of these procedures: Heart (coronary) artery bypass graft surgery. Peripheral artery bypass graft surgery. Follow these instructions at home: Eating and drinking  Eat a heart-healthy diet. Talk with your health care provider or a diet and nutrition specialist (dietitian) if you need help. A heart-healthy diet involves: Limiting unhealthy fats and increasing healthy fats. Some examples of healthy fats are olive oil and canola oil. Eating plant-based foods, such as fruits, vegetables, nuts, whole grains, and legumes (such as peas and lentils). If you drink alcohol: Limit how much you use to: 0-1 drink a day for nonpregnant women. 0-2 drinks a day for men. Be aware of how much alcohol is in your drink. In the [place] one drink equals one 12 oz bottle of beer (355 mL), one 5 oz glass of wine (148 mL), or one 1 oz glass of hard liquor (44 mL).  Lifestyle  Maintain a healthy weight. Lose weight if your health care provider says that you need to do that. Follow an exercise program as told by your health care provider. Do not use any products that contain nicotine or tobacco, such as cigarettes, e-cigarettes, and  chewing tobacco. If you need help quitting, ask your health care provider. Do not use drugs.  General instructions Take over-the-counter and prescription medicines only as told by your health care provider. Manage other health conditions as told. Keep all follow-up visits as told by your health care provider. This is important. Contact a health care provider if you have: An irregular heartbeat. Unexplained fatigue. Trouble urinating, or you are producing less urine or foamy urine. Swelling of your hands or feet, or itchy skin. Unexplained pain or numbness in your legs or hips. Get help right away if: You have any symptoms of a heart attack. These may be: Chest pain. This includes squeezing chest pain that may feel like indigestion (angina). Shortness of breath. Pain in your neck, jaw, arms, back, or stomach. Cold sweat. Nausea. Light-headedness. You have any symptoms of a stroke. "BE FAST" is an easy way to remember the main warning signs of a stroke: B - Balance. Signs are dizziness, sudden trouble walking, or loss of balance. E - Eyes. Signs are trouble seeing or a sudden change in vision. F - Face. Signs are sudden weakness or numbness of the face, or the face or eyelid drooping on one side. A - Arms. Signs are weakness or numbness in an arm. This happens suddenly and usually on one side of the body. S - Speech. Signs are sudden trouble speaking, slurred speech, or trouble understanding what people say. T - Time. Time to call emergency services. Write down what time symptoms started. You have other signs of a stroke, such as: A sudden, severe headache with no known cause. Nausea or vomiting. Seizure. These  symptoms may represent a serious problem that is an emergency. Do not wait to see if the symptoms will go away. Get medical help right away. Call your local emergency services (911 in the [place] Do not drive yourself to the hospital. Summary Atherosclerosis is narrowing and  hardening of the arteries. Arteries can become narrow from inflammation or from a buildup of fat, cholesterol, calcium, and other substances (plaque). This condition may not cause any symptoms. If you do have symptoms, they are caused by damage to an area of your body that is not getting enough blood. Treatment starts with lifestyle changes and may include medicines. In some cases, surgery is needed. Get help right away if you have any symptoms of a heart attack or stroke. This information is not intended to replace advice given to you by your health care provider. Make sure you discuss any questions you have with your healthcare provider. Cholesterol Content in Foods Cholesterol is a waxy, fat-like substance that helps to carry fat in the blood. The body needs cholesterol in small amounts, but too much cholesterol can causedamage to the arteries and heart. Most people should eat less than 200 milligrams (mg) of cholesterol a day. Foods with cholesterol  Cholesterol is found in animal-based foods, such as meat, seafood, and dairy. Generally, low-fat dairy and lean meats have less cholesterol than full-fat dairy and fatty meats. The milligrams of cholesterol per serving (mg per serving) of common cholesterol-containing foods are listed below. Meat and other proteins Egg -- one large whole egg has 186 mg. Veal shank -- 4 oz has 141 mg. Lean ground [place] (93% lean) -- 4 oz has 118 mg. Fat-trimmed lamb loin -- 4 oz has 106 mg. Lean ground beef (90% lean) -- 4 oz has 100 mg. Lobster -- 3.5 oz has 90 mg. Pork loin chops -- 4 oz has 86 mg. Canned salmon -- 3.5 oz has 83 mg. Fat-trimmed beef top loin -- 4 oz has 78 mg. Frankfurter -- 1 frank (3.5 oz) has 77 mg. Crab -- 3.5 oz has 71 mg. Roasted chicken without skin, white meat -- 4 oz has 66 mg. Light bologna -- 2 oz has 45 mg. Deli-cut [place] -- 2 oz has 31 mg. Canned tuna -- 3.5 oz has 31 mg. Berniece Salines -- 1 oz has 29 mg. Oysters and mussels (raw)  -- 3.5 oz has 25 mg. Mackerel -- 1 oz has 22 mg. Trout -- 1 oz has 20 mg. Pork sausage -- 1 link (1 oz) has 17 mg. Salmon -- 1 oz has 16 mg. Tilapia -- 1 oz has 14 mg. Dairy Soft-serve ice cream --  cup (4 oz) has 103 mg. Whole-milk yogurt -- 1 cup (8 oz) has 29 mg. Cheddar cheese -- 1 oz has 28 mg. American cheese -- 1 oz has 28 mg. Whole milk -- 1 cup (8 oz) has 23 mg. 2% milk -- 1 cup (8 oz) has 18 mg. Cream cheese -- 1 tablespoon (Tbsp) has 15 mg. Cottage cheese --  cup (4 oz) has 14 mg. Low-fat (1%) milk -- 1 cup (8 oz) has 10 mg. Sour cream -- 1 Tbsp has 8.5 mg. Low-fat yogurt -- 1 cup (8 oz) has 8 mg. Nonfat Greek yogurt -- 1 cup (8 oz) has 7 mg. Half-and-half cream -- 1 Tbsp has 5 mg. Fats and oils Cod liver oil -- 1 tablespoon (Tbsp) has 82 mg. Butter -- 1 Tbsp has 15 mg. Lard -- 1 Tbsp has 14 mg. Berniece Salines  grease -- 1 Tbsp has 14 mg. Mayonnaise -- 1 Tbsp has 5-10 mg. Margarine -- 1 Tbsp has 3-10 mg. Exact amounts of cholesterol in these foods may vary depending on specificingredients and brands. Foods without cholesterol Most plant-based foods do not have cholesterol unless you combine them with a food that has cholesterol. Foods without cholesterol include: Grains and cereals. Vegetables. Fruits. Vegetable oils, such as olive, canola, and sunflower oil. Legumes, such as peas, beans, and lentils. Nuts and seeds. Egg whites. Summary The body needs cholesterol in small amounts, but too much cholesterol can cause damage to the arteries and heart. Most people should eat less than 200 milligrams (mg) of cholesterol a day. This information is not intended to replace advice given to you by your health care provider. Make sure you discuss any questions you have with your healthcare provider. Document Revised: 04/07/2019 Document Reviewed: 05/18/2019 Elsevier Patient Education  [place] Revised: 11/03/2018 Document Reviewed: 11/03/2018 Elsevier Patient  Education  2022 [employer].

## 2020-08-24 ENCOUNTER — Encounter: Payer: Self-pay | Admitting: Internal Medicine

## 2020-08-24 LAB — CBC WITH DIFFERENTIAL/PLATELET
Basophils Absolute: 0 10*3/uL (ref 0.0–0.2)
Basos: 1 %
EOS (ABSOLUTE): 0 10*3/uL (ref 0.0–0.4)
Eos: 1 %
Hematocrit: 38.2 % (ref 37.5–51.0)
Hemoglobin: 13 g/dL (ref 13.0–17.7)
Immature Grans (Abs): 0 10*3/uL (ref 0.0–0.1)
Immature Granulocytes: 0 %
Lymphocytes Absolute: 1.1 10*3/uL (ref 0.7–3.1)
Lymphs: 26 %
MCH: 32.7 pg (ref 26.6–33.0)
MCHC: 34 g/dL (ref 31.5–35.7)
MCV: 96 fL (ref 79–97)
Monocytes Absolute: 0.4 10*3/uL (ref 0.1–0.9)
Monocytes: 10 %
Neutrophils Absolute: 2.7 10*3/uL (ref 1.4–7.0)
Neutrophils: 62 %
Platelets: 199 10*3/uL (ref 150–450)
RBC: 3.97 x10E6/uL — ABNORMAL LOW (ref 4.14–5.80)
RDW: 13.2 % (ref 11.6–15.4)
WBC: 4.3 10*3/uL (ref 3.4–10.8)

## 2020-08-24 LAB — COMPREHENSIVE METABOLIC PANEL
ALT: 23 IU/L (ref 0–44)
AST: 24 IU/L (ref 0–40)
Albumin/Globulin Ratio: 2.8 — ABNORMAL HIGH (ref 1.2–2.2)
Albumin: 4.8 g/dL (ref 4.0–5.0)
Alkaline Phosphatase: 52 IU/L (ref 44–121)
BUN/Creatinine Ratio: 19 (ref 9–20)
BUN: 18 mg/dL (ref 6–24)
Bilirubin Total: 0.4 mg/dL (ref 0.0–1.2)
CO2: 23 mmol/L (ref 20–29)
Calcium: 9.4 mg/dL (ref 8.7–10.2)
Chloride: 106 mmol/L (ref 96–106)
Creatinine, Ser: 0.93 mg/dL (ref 0.76–1.27)
Globulin, Total: 1.7 g/dL (ref 1.5–4.5)
Glucose: 99 mg/dL (ref 65–99)
Potassium: 4.4 mmol/L (ref 3.5–5.2)
Sodium: 143 mmol/L (ref 134–144)
Total Protein: 6.5 g/dL (ref 6.0–8.5)
eGFR: 104 mL/min/{1.73_m2} (ref >59)

## 2020-08-24 LAB — LIPID PANEL
Chol/HDL Ratio: 5.4 ratio — ABNORMAL HIGH (ref 0.0–5.0)
Cholesterol, Total: 209 mg/dL — ABNORMAL HIGH (ref 100–199)
HDL: 39 mg/dL — ABNORMAL LOW (ref >39)
LDL Chol Calc (NIH): 140 mg/dL — ABNORMAL HIGH (ref 0–99)
Triglycerides: 166 mg/dL — ABNORMAL HIGH (ref 0–149)
VLDL Cholesterol Cal: 30 mg/dL (ref 5–40)

## 2020-08-24 LAB — CARDIOVASCULAR RISK ASSESSMENT

## 2020-08-24 LAB — TSH: TSH: 0.948 u[IU]/mL (ref 0.450–4.500)

## 2020-11-18 ENCOUNTER — Ambulatory Visit: Payer: 59 | Admitting: Thoracic Surgery (Cardiothoracic Vascular Surgery)

## 2020-11-25 ENCOUNTER — Ambulatory Visit: Payer: 59 | Admitting: Nurse Practitioner

## 2020-11-25 ENCOUNTER — Other Ambulatory Visit: Payer: Self-pay

## 2020-11-25 ENCOUNTER — Encounter: Payer: Self-pay | Admitting: Nurse Practitioner

## 2020-11-25 VITALS — BP 118/78 | HR 71 | Temp 96.7°F | Ht 71.0 in | Wt 296.0 lb

## 2020-11-25 DIAGNOSIS — I700 Atherosclerosis of aorta: Secondary | ICD-10-CM | POA: Diagnosis not present

## 2020-11-25 DIAGNOSIS — Z2821 Immunization not carried out because of patient refusal: Secondary | ICD-10-CM

## 2020-11-25 DIAGNOSIS — Z85118 Personal history of other malignant neoplasm of bronchus and lung: Secondary | ICD-10-CM

## 2020-11-25 NOTE — Patient Instructions (Addendum)
We will call you with lab results Continue heart healthy diet Increase physical activity Recommend routine eye exam  Follow-up 43-months  Cholesterol Content in Foods Cholesterol is a waxy, fat-like substance that helps to carry fat in the blood. The body needs cholesterol in small amounts, but too much cholesterol can cause damage to the arteries and heart. What foods have cholesterol? Cholesterol is found in animal-based foods, such as meat, seafood, and dairy. Generally, low-fat dairy and lean meats have less cholesterol than full-fat dairy and fatty meats. The milligrams of cholesterol per serving (mg per serving) of common cholesterol-containing foods are listed below. Meats and other proteins Egg -- one large whole egg has 186 mg. Veal shank -- 4 oz (113 g) has 141 mg. Lean ground [place] (93% lean) -- 4 oz (113 g) has 118 mg. Fat-trimmed lamb loin -- 4 oz (113 g) has 106 mg. Lean ground beef (90% lean) -- 4 oz (113 g) has 100 mg. Lobster -- 3.5 oz (99 g) has 90 mg. Pork loin chops -- 4 oz (113 g) has 86 mg. Canned salmon -- 3.5 oz (99 g) has 83 mg. Fat-trimmed beef top loin -- 4 oz (113 g) has 78 mg. Frankfurter -- 1 frank (3.5 oz or 99 g) has 77 mg. Crab -- 3.5 oz (99 g) has 71 mg. Roasted chicken without skin, white meat -- 4 oz (113 g) has 66 mg. Light bologna -- 2 oz (57 g) has 45 mg. Deli-cut [place] -- 2 oz (57 g) has 31 mg. Canned tuna -- 3.5 oz (99 g) has 31 mg. Berniece Salines -- 1 oz (28 g) has 29 mg. Oysters and mussels (raw) -- 3.5 oz (99 g) has 25 mg. Mackerel -- 1 oz (28 g) has 22 mg. Trout -- 1 oz (28 g) has 20 mg. Pork sausage -- 1 link (1 oz or 28 g) has 17 mg. Salmon -- 1 oz (28 g) has 16 mg. Tilapia -- 1 oz (28 g) has 14 mg. Dairy Soft-serve ice cream --  cup (4 oz or 86 g) has 103 mg. Whole-milk yogurt -- 1 cup (8 oz or 245 g) has 29 mg. Cheddar cheese -- 1 oz (28 g) has 28 mg. American cheese -- 1 oz (28 g) has 28 mg. Whole milk -- 1 cup (8 oz or 250 mL) has 23  mg. 2% milk -- 1 cup (8 oz or 250 mL) has 18 mg. Cream cheese -- 1 tablespoon (Tbsp) (14.5 g) has 15 mg. Cottage cheese --  cup (4 oz or 113 g) has 14 mg. Low-fat (1%) milk -- 1 cup (8 oz or 250 mL) has 10 mg. Sour cream -- 1 Tbsp (12 g) has 8.5 mg. Low-fat yogurt -- 1 cup (8 oz or 245 g) has 8 mg. Nonfat Greek yogurt -- 1 cup (8 oz or 228 g) has 7 mg. Half-and-half cream -- 1 Tbsp (15 mL) has 5 mg. Fats and oils Cod liver oil -- 1 tablespoon (Tbsp) (13.6 g) has 82 mg. Butter -- 1 Tbsp (14 g) has 15 mg. Lard -- 1 Tbsp (12.8 g) has 14 mg. Bacon grease -- 1 Tbsp (12.9 g) has 14 mg. Mayonnaise -- 1 Tbsp (13.8 g) has 5-10 mg. Margarine -- 1 Tbsp (14 g) has 3-10 mg. The items listed above may not be a complete list of foods with cholesterol. Exact amounts of cholesterol in these foods may vary depending on specific ingredients and brands. Contact a dietitian for more information. What  foods do not have cholesterol? Most plant-based foods do not have cholesterol unless you combine them with a food that has cholesterol. Foods without cholesterol include: Grains and cereals. Vegetables. Fruits. Vegetable oils, such as olive, canola, and sunflower oil. Legumes, such as peas, beans, and lentils. Nuts and seeds. Egg whites. The items listed above may not be a complete list of foods that do not have cholesterol. Contact a dietitian for more information. Summary The body needs cholesterol in small amounts, but too much cholesterol can cause damage to the arteries and heart. Cholesterol is found in animal-based foods, such as meat, seafood, and dairy. Generally, low-fat dairy and lean meats have less cholesterol than full-fat dairy and fatty meats. This information is not intended to replace advice given to you by your health care provider. Make sure you discuss any questions you have with your health care provider. Document Revised: 05/06/2020 Document Reviewed: 05/06/2020 Elsevier Patient  Education  2022 [place].   Preventing High Cholesterol Cholesterol is a white, waxy substance similar to fat that the human body needs to help build cells. The liver makes all the cholesterol that a person's body needs. Having high cholesterol (hypercholesterolemia) increases your risk for heart disease and stroke. Extra or excess cholesterol comes from the food that you eat. High cholesterol can often be prevented with diet and lifestyle changes. If you already have high cholesterol, you can control it with diet, lifestyle changes, and medicines. How can high cholesterol affect me? If you have high cholesterol, fatty deposits (plaques) may build up on the walls of your blood vessels. The blood vessels that carry blood away from your heart are called arteries. Plaques make the arteries narrower and stiffer. This in turn can: Restrict or block blood flow and cause blood clots to form. Increase your risk for heart attack and stroke. What can increase my risk for high cholesterol? This condition is more likely to develop in people who: Eat foods that are high in saturated fat or cholesterol. Saturated fat is mostly found in foods that come from animal sources. Are overweight. Are not getting enough exercise. Use products that contain nicotine or tobacco, such as cigarettes, e-cigarettes, and chewing tobacco. Have a family history of high cholesterol (familial hypercholesterolemia). What actions can I take to prevent this? Nutrition  Eat less saturated fat. Avoid trans fats (partially hydrogenated oils). These are often found in margarine and in some baked goods, fried foods, and snacks bought in packages. Avoid precooked or cured meat, such as bacon, sausages, or meat loaves. Avoid foods and drinks that have added sugars. Eat more fruits, vegetables, and whole grains. Choose healthy sources of protein, such as fish, poultry, lean cuts of red meat, beans, peas, lentils, and nuts. Choose  healthy sources of fat, such as: Nuts. Vegetable oils, especially olive oil. Fish that have healthy fats, such as omega-3 fatty acids. These fish include mackerel or salmon. Lifestyle Lose weight if you are overweight. Maintaining a healthy body mass index (BMI) can help prevent or control high cholesterol. It can also lower your risk for diabetes and high blood pressure. Ask your health care provider to help you with a diet and exercise plan to lose weight safely. Do not use any products that contain nicotine or tobacco. These products include cigarettes, chewing tobacco, and vaping devices, such as e-cigarettes. If you need help quitting, ask your health care provider. Alcohol use Do not drink alcohol if: Your health care provider tells you not to drink. You  are pregnant, may be pregnant, or are planning to become pregnant. If you drink alcohol: Limit how much you have to: 0-1 drink a day for women. 0-2 drinks a day for men. Know how much alcohol is in your drink. In the [place] one drink equals one 12 oz bottle of beer (355 mL), one 5 oz glass of wine (148 mL), or one 1 oz glass of hard liquor (44 mL). Activity  Get enough exercise. Do exercises as told by your health care provider. Each week, do at least 150 minutes of exercise that takes a medium level of effort (moderate-intensity exercise). This kind of exercise: Makes your heart beat faster while allowing you to still be able to talk. Can be done in short sessions several times a day or longer sessions a few times a week. For example, on 5 days each week, you could walk fast or ride your bike 3 times a day for 10 minutes each time. Medicines Your health care provider may recommend medicines to help lower cholesterol. This may be a medicine to lower the amount of cholesterol that your liver makes. You may need medicine if: Diet and lifestyle changes have not lowered your cholesterol enough. You have high cholesterol and other risk  factors for heart disease or stroke. Take over-the-counter and prescription medicines only as told by your health care provider. General information Manage your risk factors for high cholesterol. Talk with your health care provider about all your risk factors and how to lower your risk. Manage other conditions that you have, such as diabetes or high blood pressure (hypertension). Have blood tests to check your cholesterol levels at regular points in time as told by your health care provider. Keep all follow-up visits. This is important. Where to find more information American Heart Association: www.heart.org National Heart, Lung, and Blood Institute: https://wilson-eaton.com/ Summary High cholesterol increases your risk for heart disease and stroke. By keeping your cholesterol level low, you can reduce your risk for these conditions. High cholesterol can often be prevented with diet and lifestyle changes. Work with your health care provider to manage your risk factors, and have your blood tested regularly. This information is not intended to replace advice given to you by your health care provider. Make sure you discuss any questions you have with your health care provider. Document Revised: 02/29/2020 Document Reviewed: 02/29/2020 Elsevier Patient Education  2022 [employer].

## 2020-11-25 NOTE — Progress Notes (Signed)
Subjective:  Patient ID: Jerry [name], male    DOB: 08/29/1975  Age: 45 y.o. MRN: [id number]  CC: Aortic atherosclerosis  HPI: Elison is a 45 year old Caucasian male that presents for follow-up of hyperlipidemia. He is not currently taking statin therapy. He finished chemotherapy for right upper lung cancer, s/p right upper lobectomy. States he is feeling well. Denies fatigue or dyspnea. He has returned to work full-time. States he has mild intermittent RUQ abdominal pain with bending over and straining. States pain is located to right upper lobectomy surgery site incision. He tells me that he is experiencing mild bilateral lower leg swelling after working. States he spends long hours walking on rocks at [employer]  He has declined flu vaccine today.     No current outpatient medications on file prior to visit.   Current Facility-Administered Medications on File Prior to Visit  Medication Dose Route Frequency Provider Last Rate Last Admin   0.9 %  sodium chloride infusion   Intravenous Continuous Curt Bears, MD       0.9 %  sodium chloride infusion   Intravenous Continuous Curt Bears, MD 900 mL/hr at 05/11/20 0940 New Bag at 05/11/20 0940   ondansetron (ZOFRAN) injection 8 mg  8 mg Intravenous Once Ardeen Garland, RN       Past Medical History:  Diagnosis Date   Aortic atherosclerosis ([place]) 08/05/2020   per chest CT   CAD (coronary artery disease), native coronary artery 08/05/2020   Left anterior descending CAD noted on chest CT   Cancer [facility])    Disorder due to vaping    Former smoker    Pneumonia    Past Surgical History:  Procedure Laterality Date   INTERCOSTAL NERVE BLOCK Right 03/28/2020   Procedure: INTERCOSTAL NERVE BLOCK;  Surgeon: [name], MD;  Location: [place];  Service: Thoracic;  Laterality: Right;   LUNG LOBECTOMY Right 03/30/2020   LYMPH NODE DISSECTION Right 03/28/2020   Procedure: LYMPH NODE DISSECTION;  Surgeon: [name], MD;   Location: [place];  Service: Thoracic;  Laterality: Right;   VIDEO BRONCHOSCOPY WITH ENDOBRONCHIAL NAVIGATION Right 03/02/2020   Procedure: VIDEO BRONCHOSCOPY WITH ENDOBRONCHIAL NAVIGATION;  Surgeon: Garner Nash, DO;  Location: [place];  Service: Pulmonary;  Laterality: Right;   VIDEO BRONCHOSCOPY WITH ENDOBRONCHIAL ULTRASOUND Bilateral 03/02/2020   Procedure: VIDEO BRONCHOSCOPY WITH ENDOBRONCHIAL ULTRASOUND;  Surgeon: Garner Nash, DO;  Location: [place];  Service: Pulmonary;  Laterality: Bilateral;   WISDOM TOOTH EXTRACTION      Family History  Problem Relation Age of Onset   Stroke Father    Hypertension Father    Hyperlipidemia Father    Arrhythmia Father    Lung cancer Paternal Aunt    Social History   Socioeconomic History   Marital status: Married    Spouse name: Not on file   Number of children: 6   Years of education: Not on file   Highest education level: Not on file  Occupational History   Occupation: Clinical biochemist  Tobacco Use   Smoking status: Former    Packs/day: 1.00    Years: 17.00    Pack years: 17.00    Types: Cigarettes    Start date: 03/01/1991    Quit date: 02/29/2008    Years since quitting: 12.7   Smokeless tobacco: Never  Vaping Use   Vaping Use: Former   Start date: 02/08/2013   Quit date: 02/29/2020   Substances: Nicotine  Substance and Sexual Activity  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   Drug use: Never   Sexual activity: Yes    Partners: Female  Other Topics Concern   Not on file  Social History Narrative   Not on file   Social Determinants of Health   Financial Resource Strain: Not on file  Food Insecurity: Not on file  Transportation Needs: Not on file  Physical Activity: Not on file  Stress: Not on file  Social Connections: Not on file    Review of Systems  Constitutional:  Negative for chills, diaphoresis, fatigue and fever.  HENT:  Negative for congestion, ear pain and sore throat.   Eyes: Negative.    Respiratory:  Negative for cough and shortness of breath.   Cardiovascular:  Negative for chest pain and leg swelling.  Gastrointestinal:  Positive for abdominal pain (intermittent, RUQ at surgical incision site). Negative for constipation, diarrhea, nausea and vomiting.  Endocrine: Negative.   Genitourinary:  Negative for dysuria and urgency.  Musculoskeletal:  Negative for arthralgias and myalgias.  Skin: Negative.   Allergic/Immunologic: Negative.   Neurological:  Negative for dizziness and headaches.  Hematological: Negative.   Psychiatric/Behavioral:  Negative for dysphoric mood.     Objective:  Pulse 71   Temp (!) 96.7 F (35.9 C)   Ht 5\' 11"  (1.803 m)   Wt 296 lb (134.3 kg)   SpO2 98%   BMI 41.28 kg/m   BP/Weight 11/25/2020 5/36/1443 01/12/4006  Systolic BP - 676 195  Diastolic BP - 60 82  Wt. (Lbs) 296 299 299.8  BMI 41.28 41.7 41.81    Physical Exam Vitals reviewed.  Constitutional:      Appearance: Normal appearance.  HENT:     Head: Normocephalic.     Right Ear: Tympanic membrane normal.     Left Ear: Tympanic membrane normal.     Nose: Nose normal.     Mouth/Throat:     Mouth: Mucous membranes are moist.  Eyes:     Pupils: Pupils are equal, round, and reactive to light.  Cardiovascular:     Rate and Rhythm: Normal rate and regular rhythm.     Pulses: Normal pulses.     Heart sounds: Normal heart sounds.  Pulmonary:     Effort: Pulmonary effort is normal.     Breath sounds: Normal breath sounds.  Abdominal:     General: Bowel sounds are normal.     Palpations: Abdomen is soft.  Musculoskeletal:        General: Normal range of motion.     Cervical back: Neck supple.  Skin:    General: Skin is warm and dry.     Capillary Refill: Capillary refill takes less than 2 seconds.  Neurological:     General: No focal deficit present.     Mental Status: He is alert and oriented to person, place, and time.  Psychiatric:        Mood and Affect: Mood normal.         Behavior: Behavior normal.        Lab Results  Component Value Date   WBC 4.3 08/23/2020   HGB 13.0 08/23/2020   HCT 38.2 08/23/2020   PLT 199 08/23/2020   GLUCOSE 99 08/23/2020   CHOL 209 (H) 08/23/2020   TRIG 166 (H) 08/23/2020   HDL 39 (L) 08/23/2020   LDLCALC 140 (H) 08/23/2020   ALT 23 08/23/2020   AST 24 08/23/2020   NA 143 08/23/2020  K 4.4 08/23/2020   CL 106 08/23/2020   CREATININE 0.93 08/23/2020   BUN 18 08/23/2020   CO2 23 08/23/2020   TSH 0.948 08/23/2020   INR 1.0 03/24/2020      Assessment & Plan:    1. Aortic atherosclerosis (H[facility]) - CBC with Differential/Platelet - Comprehensive metabolic panel - Lipid panel  2. History of lung cancer  3. Refused influenza vaccine      We will call you with lab results Continue heart healthy diet Increase physical activity Recommend routine eye exam  Follow-up 79-months    Follow-up: 27-months  An After Visit Summary was printed and given to the patient.  I, [name], NP, have reviewed all documentation for this visit. The documentation on 11/25/20 for the exam, diagnosis, procedures, and orders are all accurate and complete.    Signed, [name], NP [place] [phone]

## 2020-11-26 ENCOUNTER — Encounter: Payer: Self-pay | Admitting: Internal Medicine

## 2020-11-26 LAB — CBC WITH DIFFERENTIAL/PLATELET
Basophils Absolute: 0 10*3/uL (ref 0.0–0.2)
Basos: 1 %
EOS (ABSOLUTE): 0.1 10*3/uL (ref 0.0–0.4)
Eos: 1 %
Hematocrit: 45.4 % (ref 37.5–51.0)
Hemoglobin: 15.2 g/dL (ref 13.0–17.7)
Immature Grans (Abs): 0 10*3/uL (ref 0.0–0.1)
Immature Granulocytes: 0 %
Lymphocytes Absolute: 1.5 10*3/uL (ref 0.7–3.1)
Lymphs: 17 %
MCH: 30.5 pg (ref 26.6–33.0)
MCHC: 33.5 g/dL (ref 31.5–35.7)
MCV: 91 fL (ref 79–97)
Monocytes Absolute: 0.9 10*3/uL (ref 0.1–0.9)
Monocytes: 10 %
Neutrophils Absolute: 6.4 10*3/uL (ref 1.4–7.0)
Neutrophils: 71 %
Platelets: 255 10*3/uL (ref 150–450)
RBC: 4.98 x10E6/uL (ref 4.14–5.80)
RDW: 11.7 % (ref 11.6–15.4)
WBC: 8.9 10*3/uL (ref 3.4–10.8)

## 2020-11-26 LAB — LIPID PANEL
Chol/HDL Ratio: 5.5 ratio — ABNORMAL HIGH (ref 0.0–5.0)
Cholesterol, Total: 232 mg/dL — ABNORMAL HIGH (ref 100–199)
HDL: 42 mg/dL (ref >39)
LDL Chol Calc (NIH): 161 mg/dL — ABNORMAL HIGH (ref 0–99)
Triglycerides: 158 mg/dL — ABNORMAL HIGH (ref 0–149)
VLDL Cholesterol Cal: 29 mg/dL (ref 5–40)

## 2020-11-26 LAB — COMPREHENSIVE METABOLIC PANEL
ALT: 27 IU/L (ref 0–44)
AST: 21 IU/L (ref 0–40)
Albumin/Globulin Ratio: 2.1 (ref 1.2–2.2)
Albumin: 4.9 g/dL (ref 4.0–5.0)
Alkaline Phosphatase: 76 IU/L (ref 44–121)
BUN/Creatinine Ratio: 14 (ref 9–20)
BUN: 17 mg/dL (ref 6–24)
Bilirubin Total: 0.2 mg/dL (ref 0.0–1.2)
CO2: 27 mmol/L (ref 20–29)
Calcium: 9.7 mg/dL (ref 8.7–10.2)
Chloride: 103 mmol/L (ref 96–106)
Creatinine, Ser: 1.18 mg/dL (ref 0.76–1.27)
Globulin, Total: 2.3 g/dL (ref 1.5–4.5)
Glucose: 107 mg/dL — ABNORMAL HIGH (ref 70–99)
Potassium: 4.9 mmol/L (ref 3.5–5.2)
Sodium: 143 mmol/L (ref 134–144)
Total Protein: 7.2 g/dL (ref 6.0–8.5)
eGFR: 79 mL/min/{1.73_m2} (ref >59)

## 2020-11-26 LAB — CARDIOVASCULAR RISK ASSESSMENT

## 2020-11-28 ENCOUNTER — Other Ambulatory Visit: Payer: Self-pay

## 2020-11-28 MED ORDER — ROSUVASTATIN CALCIUM 5 MG PO TABS: 5.0000 mg | ORAL_TABLET | Freq: Every day | ORAL | 0 refills | Status: DC

## 2020-11-29 LAB — SPECIMEN STATUS REPORT

## 2020-11-29 LAB — HGB A1C W/O EAG: Hgb A1c MFr Bld: 5.6 % (ref 4.8–5.6)

## 2020-12-07 ENCOUNTER — Other Ambulatory Visit: Payer: Self-pay

## 2020-12-07 ENCOUNTER — Encounter (HOSPITAL_COMMUNITY): Payer: Self-pay

## 2020-12-07 ENCOUNTER — Ambulatory Visit (HOSPITAL_COMMUNITY)
Admission: RE | Admit: 2020-12-07 | Discharge: 2020-12-07 | Disposition: A | Discharge status: Home / Self Care | Payer: 59 | Source: Ambulatory Visit | Attending: Physician Assistant | Admitting: Physician Assistant

## 2020-12-07 DIAGNOSIS — C3491 Malignant neoplasm of unspecified part of right bronchus or lung: Secondary | ICD-10-CM | POA: Diagnosis not present

## 2020-12-07 MED ORDER — SODIUM CHLORIDE (PF) 0.9 % IJ SOLN
INTRAMUSCULAR | Status: AC
  Filled 2020-12-07: qty 50

## 2020-12-07 MED ORDER — IOHEXOL 350 MG/ML SOLN
60.0000 mL | Freq: Once | INTRAVENOUS | Status: AC | PRN
  Administered 2020-12-07: 60 mL via INTRAVENOUS

## 2020-12-09 ENCOUNTER — Inpatient Hospital Stay: Payer: 59

## 2020-12-09 NOTE — Progress Notes (Signed)
[place] OFFICE PROGRESS NOTE  [name], NP [place]. [address] [place] [address]  DIAGNOSIS: Stage IIA (T2b, N0, M0) non-small cell lung cancer, adenocarcinoma presented with right upper lobe lung mass diagnosed in February 2022.   Biomarker Findings Microsatellite status - MS-Stable Tumor Mutational Burden - 4 Muts/Mb Genomic Findings For a complete list of the genes assayed, please refer to the Appendix. KRAS G12D KEAP1 P278S 7 Disease relevant genes with no reportable alterations: ALK, BRAF, EGFR, ERBB2, MET, RET, ROS1   PDL1 Expression 5%.  PRIOR THERAPY: 1) Status post robotic assisted right video thoracoscopy with right upper lobectomy and mediastinal lymph node sampling under the care of Dr. Kipp Brood on March 28, 2020.  2) Adjuvant systemic chemotherapy with cisplatin 75 Mg/M2 and Alimta 500 Mg/M2 every 3 weeks. Status pose 4 cycles. last dose on 07/07/20. Cisplatin was discontined due to uncontrolled nausea/vomiting. Starting from cycle #3, he was  on carboplatin for an AUC of 5 and Alimta 500 mg/m2.   CURRENT THERAPY: Observation  INTERVAL HISTORY: Jerry [name] 45 y.o. male returns to the clinic today for a follow-up visit. The patient completed 4 cycles of adjuvant systemic chemotherapy. He had a challenging time with treatment secondary to significant decreased appetite, nausea/vomiting, fatigue, and generalized weakness.  His last dose of treatment was on 07/07/2020. He has recovered following treatment.   He is here today for 95-monthfollow-up visit and feeling fairly well.  He denies any recent fever, chills, or night sweats. His appetite has improved and he has gained weight. He denies any chest pain, or hemoptysis. He might have some mild shortness of breath if going up an incline. Denies any cough.  Denies any nausea, vomiting, diarrhea, or constipation.  Denies any headache or visual changes.  The patient recently had a restaging CT scan  performed.  He is here today for evaluation to review his scan results.    MEDICAL HISTORY: Past Medical History:  Diagnosis Date   Aortic atherosclerosis ([place] 08/05/2020   per chest CT   CAD (coronary artery disease), native coronary artery 08/05/2020   Left anterior descending CAD noted on chest CT   [facility]cer [facility]    Disorder due to vaping    Former smoker    Pneumonia     ALLERGIES:  is allergic to olanzapine.  MEDICATIONS:  Current Outpatient Medications  Medication Sig Dispense Refill   rosuvastatin (CRESTOR) 5 MG tablet Take 1 tablet (5 mg total) by mouth daily. 90 tablet 0   No current facility-administered medications for this visit.   Facility-Administered Medications Ordered in Other Visits  Medication Dose Route Frequency Provider Last Rate Last Admin   0.9 %  sodium chloride infusion   Intravenous Continuous MCurt Bears MD       0.9 %  sodium chloride infusion   Intravenous Continuous MCurt Bears MD 900 mL/hr at 05/11/20 0940 New Bag at 05/11/20 0940   ondansetron (ZOFRAN) injection 8 mg  8 mg Intravenous Once BArdeen Garland RN        SURGICAL HISTORY:  Past Surgical History:  Procedure Laterality Date   INTERCOSTAL NERVE BLOCK Right 03/28/2020   Procedure: INTERCOSTAL NERVE BLOCK;  Surgeon: LLajuana Matte MD;  [facility]  Service: Thoracic;  Laterality: Right;   LUNG LOBECTOMY Right 03/30/2020   LYMPH NODE DISSECTION Right 03/28/2020   Procedure: LYMPH NODE DISSECTION;  Surgeon: LLajuana Matte MD;  [place] [facility]  Service: Thoracic;  Laterality: Right;   VIDEO BRONCHOSCOPY WITH ENDOBRONCHIAL NAVIGATION Right 03/02/2020   Procedure: VIDEO BRONCHOSCOPY WITH ENDOBRONCHIAL NAVIGATION;  Surgeon: Garner Nash, DO;  Location: [place];  Service: Pulmonary;  Laterality: Right;   VIDEO BRONCHOSCOPY WITH ENDOBRONCHIAL ULTRASOUND Bilateral 03/02/2020   Procedure: VIDEO BRONCHOSCOPY WITH ENDOBRONCHIAL ULTRASOUND;  Surgeon: Garner Nash,  DO;  Location: [place];  Service: Pulmonary;  Laterality: Bilateral;   WISDOM TOOTH EXTRACTION      REVIEW OF SYSTEMS:   Review of Systems  Constitutional: Negative for appetite change, chills, fatigue, fever and unexpected weight change.  HENT:   Negative for mouth sores, nosebleeds, sore throat and trouble swallowing.   Eyes: Negative for eye problems and icterus.  Respiratory: Positive for mild dyspnea on exertion. Negative for cough, hemoptysis, and wheezing.   Cardiovascular: Negative for chest pain and leg swelling.  Gastrointestinal: Negative for abdominal pain, constipation, diarrhea, nausea and vomiting.  Genitourinary: Negative for bladder incontinence, difficulty urinating, dysuria, frequency and hematuria.   Musculoskeletal: Negative for back pain, gait problem, neck pain and neck stiffness.  Skin: Negative for itching and rash.  Neurological: Negative for dizziness, extremity weakness, gait problem, headaches, light-headedness and seizures.  Hematological: Negative for adenopathy. Does not bruise/bleed easily.  Psychiatric/Behavioral: Negative for confusion, depression and sleep disturbance. The patient is not nervous/anxious.     PHYSICAL EXAMINATION:  Blood pressure 135/83, pulse 79, temperature 98.6 F (37 C), temperature source Tympanic, weight (!) 311 lb 5 oz (141.2 kg), SpO2 98 %.  ECOG PERFORMANCE STATUS: 1  Physical Exam  Constitutional: Oriented to person, place, and time and well-developed, well-nourished, and in no distress.  HENT:  Head: Normocephalic and atraumatic.  Mouth/Throat: Oropharynx is clear and moist. No oropharyngeal exudate.  Eyes: Conjunctivae are normal. Right eye exhibits no discharge. Left eye exhibits no discharge. No scleral icterus.  Neck: Normal range of motion. Neck supple.  Cardiovascular: Normal rate, regular rhythm, normal heart sounds and intact distal pulses.   Pulmonary/Chest: Effort normal and breath sounds normal. No respiratory  distress. No wheezes. No rales.  Abdominal: Soft. Bowel sounds are normal. Exhibits no distension and no mass. There is no tenderness.  Musculoskeletal: Normal range of motion. Exhibits no edema.  Lymphadenopathy:    No cervical adenopathy.  Neurological: Alert and oriented to person, place, and time. Exhibits normal muscle tone. Gait normal. Coordination normal.  Skin: Skin is warm and dry. No rash noted. Not diaphoretic. No erythema. No pallor.  Psychiatric: Mood, memory and judgment normal.  Vitals reviewed.  LABORATORY DATA: Lab Results  Component Value Date   WBC 6.4 12/12/2020   HGB 14.1 12/12/2020   HCT 40.9 12/12/2020   MCV 89.7 12/12/2020   PLT 192 12/12/2020      Chemistry      Component Value Date/Time   NA 139 12/12/2020 1423   NA 143 11/25/2020 0832   K 4.2 12/12/2020 1423   CL 103 12/12/2020 1423   CO2 26 12/12/2020 1423   BUN 16 12/12/2020 1423   BUN 17 11/25/2020 0832   CREATININE 0.92 12/12/2020 1423      Component Value Date/Time   CALCIUM 9.2 12/12/2020 1423   ALKPHOS 57 12/12/2020 1423   AST 34 12/12/2020 1423   ALT 50 (H) 12/12/2020 1423   BILITOT 0.6 12/12/2020 1423       RADIOGRAPHIC STUDIES:  CT Chest W Contrast  Result Date: 12/07/2020 CLINICAL DATA:  Primary Cancer Type: Lung Imaging Indication: Routine surveillance Interval therapy since last imaging?  No Initial Cancer Diagnosis Date: 03/02/2020; Established by: Biopsy-proven Detailed Pathology: Stage IIA non-small cell lung cancer, adenocarcinoma. Primary Tumor location: Right upper lobe. Surgeries: Right upper lobectomy 03/28/2020. Chemotherapy: Yes; Ongoing? No; Most recent administration: 07/07/2020 Immunotherapy? No Radiation therapy? No EXAM: CT CHEST WITH CONTRAST TECHNIQUE: Multidetector CT imaging of the chest was performed during intravenous contrast administration. CONTRAST:  80m OMNIPAQUE IOHEXOL 350 MG/ML SOLN COMPARISON:  Most recent CT chest 08/05/2020.  03/11/2020 PET-CT.  FINDINGS: Cardiovascular: Aortic atherosclerosis. Normal heart size, without pericardial effusion. Left main and LAD coronary artery calcification. No central pulmonary embolism, on this non-dedicated study. Mediastinum/Nodes: No supraclavicular adenopathy. No mediastinal or definite hilar adenopathy, given limitations of unenhanced CT. Lungs/Pleura: No pleural fluid.  Status post right upper lobectomy. Upper Abdomen: Moderate hepatic steatosis, increased. Caudate lobe enlargement. More focal hypoattenuation adjacent the gallbladder is relatively subtle on 144/2. Similar to on the prior and likely due to focal steatosis. Normal imaged portions of the spleen, stomach, pancreas, adrenal glands, kidneys. Musculoskeletal: Mild bilateral gynecomastia. No acute osseous abnormality. IMPRESSION: 1. Status post right upper lobectomy, without recurrent or metastatic disease. 2. Mild hepatic steatosis. 3. Age advanced coronary artery atherosclerosis. Recommend assessment of coronary risk factors and consideration of medical therapy. 4.  Aortic Atherosclerosis (ICD10-I70.0). 5. Mild bilateral gynecomastia. Electronically Signed   By: KAbigail MiyamotoM.D.   On: 12/07/2020 10:55     ASSESSMENT/PLAN:  This is a very pleasant 45year old Caucasian male diagnosed with stage IIa (T2b, N0, M0) non-small cell lung cancer, adenocarcinoma.  He was diagnosed in March 2022.  His PD-L1 expression is 5%.  He has no actionable mutations.   The patient is status post a right upper lobectomy with lymph node sampling under the care of Dr. LKipp Broodwhich was performed on March 28, 2020.  The patient completed adjuvant cisplatin 75 mg per metered squared and Alimta 500 mg per metered squared.  He is status post 4 cycles.  He had a challenging time with treatment. Starting from cycle #3, the patient's cisplatin was changed to carboplatin. Last dose 07/07/20.   The patient recently had a restaging CT scan performed.  The patient is here today  for evaluation to review his scan results.  Dr. MJulien Nordmannpersonally and independently reviewed the patient scan and discussed the results with the patient today.  The scan did not show any evidence for disease progression.  Dr. MJulien Nordmannrecommends that he continue on observation with a restaging CT scan in 6 months.  We will see the patient back for a follow-up visit in 6 months for evaluation to review his scan results.  His scan incidentally notes atherosclerosis and mild hepatic steatosis.  Previously encouraged the patient to have routine follow ups with his PCP for health maintenance and encouraged healthy lifestyle habits such as diet and exercise. His scan also notes CAD. He was prescribed a statin a few weeks ago but has not started this yet. Was encouraged to start this as instructed.   The patient was advised to call immediately if he has any concerning symptoms in the interval. The patient voices understanding of current disease status and treatment options and is in agreement with the current care plan. All questions were answered. The patient knows to call the clinic with any problems, questions or concerns. We can certainly see the patient much sooner if necessary      Orders Placed This Encounter  Procedures   CT Chest W Contrast    Standing Status:   Future  Standing Expiration Date:   12/12/2021    Order Specific Question:   If indicated for the ordered procedure, I authorize the administration of contrast media per Radiology protocol    Answer:   Yes    Order Specific Question:   Preferred imaging location?    Answer:   [facility]   CBC with Differential ([place] Only)    Standing Status:   Future    Standing Expiration Date:   12/12/2021   CMP ([place] only)    Standing Status:   Future    Standing Expiration Date:   12/12/2021      Tobe Sos Dedee Liss, PA-C 12/12/20  ADDENDUM: Hematology/Oncology Attending: I had a face-to-face  encounter with the patient today.  I reviewed his record, lab, scan and recommended his care plan.  This is a very pleasant 45 years old white male diagnosed with a stage IIa (22B, N0, M0) non-small cell lung cancer, adenocarcinoma with no actionable mutation and PD-L1 expression of 5% in March 2022.  The patient is status post right upper lobectomy with lymph node sampling under the care of Dr. Kipp Brood on March 2022 followed by adjuvant systemic chemotherapy initially with cisplatin and Alimta but he has a rough time with this treatment and cisplatin with changing to carboplatin starting from cycle #3 for 2 more cycles.  The patient has a rough time with his treatment with significant nausea and vomiting but he completed the course of treatment. He has been in observation since that time and he is feeling much better now and gaining weight.  He is here today for evaluation with repeat CT scan of the chest for restaging of his disease. I personally and independently reviewed the scan and discussed the results with the patient today.  His scan showed no concerning findings for disease recurrence or metastasis. I recommended for him to continue on observation with repeat CT scan of the chest in 6 months. The patient was advised to call immediately if he has any other concerning symptoms in the interval. Disclaimer: This note was dictated with voice recognition software. Similar sounding words can inadvertently be transcribed and may be missed upon review. Eilleen Kempf, MD 12/12/20

## 2020-12-12 ENCOUNTER — Other Ambulatory Visit: Payer: Self-pay

## 2020-12-12 ENCOUNTER — Inpatient Hospital Stay: Payer: 59 | Attending: Internal Medicine | Admitting: Physician Assistant

## 2020-12-12 ENCOUNTER — Inpatient Hospital Stay: Payer: 59

## 2020-12-12 VITALS — BP 135/83 | HR 79 | Temp 98.6°F | Wt 311.3 lb

## 2020-12-12 DIAGNOSIS — Z902 Acquired absence of lung [part of]: Secondary | ICD-10-CM | POA: Insufficient documentation

## 2020-12-12 DIAGNOSIS — R112 Nausea with vomiting, unspecified: Secondary | ICD-10-CM | POA: Diagnosis not present

## 2020-12-12 DIAGNOSIS — R635 Abnormal weight gain: Secondary | ICD-10-CM | POA: Insufficient documentation

## 2020-12-12 DIAGNOSIS — C3491 Malignant neoplasm of unspecified part of right bronchus or lung: Secondary | ICD-10-CM | POA: Diagnosis not present

## 2020-12-12 DIAGNOSIS — I2510 Atherosclerotic heart disease of native coronary artery without angina pectoris: Secondary | ICD-10-CM | POA: Diagnosis not present

## 2020-12-12 DIAGNOSIS — Z87891 Personal history of nicotine dependence: Secondary | ICD-10-CM | POA: Diagnosis not present

## 2020-12-12 DIAGNOSIS — N62 Hypertrophy of breast: Secondary | ICD-10-CM | POA: Insufficient documentation

## 2020-12-12 DIAGNOSIS — I700 Atherosclerosis of aorta: Secondary | ICD-10-CM | POA: Insufficient documentation

## 2020-12-12 DIAGNOSIS — Z79899 Other long term (current) drug therapy: Secondary | ICD-10-CM | POA: Insufficient documentation

## 2020-12-12 DIAGNOSIS — C3411 Malignant neoplasm of upper lobe, right bronchus or lung: Secondary | ICD-10-CM | POA: Diagnosis not present

## 2020-12-12 DIAGNOSIS — R531 Weakness: Secondary | ICD-10-CM | POA: Insufficient documentation

## 2020-12-12 DIAGNOSIS — K760 Fatty (change of) liver, not elsewhere classified: Secondary | ICD-10-CM | POA: Diagnosis not present

## 2020-12-12 LAB — CBC WITH DIFFERENTIAL (CANCER CENTER ONLY)
Abs Immature Granulocytes: 0.06 10*3/uL (ref 0.00–0.07)
Basophils Absolute: 0 10*3/uL (ref 0.0–0.1)
Basophils Relative: 1 %
Eosinophils Absolute: 0.1 10*3/uL (ref 0.0–0.5)
Eosinophils Relative: 1 %
HCT: 40.9 % (ref 39.0–52.0)
Hemoglobin: 14.1 g/dL (ref 13.0–17.0)
Immature Granulocytes: 1 %
Lymphocytes Relative: 13 %
Lymphs Abs: 0.8 10*3/uL (ref 0.7–4.0)
MCH: 30.9 pg (ref 26.0–34.0)
MCHC: 34.5 g/dL (ref 30.0–36.0)
MCV: 89.7 fL (ref 80.0–100.0)
Monocytes Absolute: 0.7 10*3/uL (ref 0.1–1.0)
Monocytes Relative: 12 %
Neutro Abs: 4.6 10*3/uL (ref 1.7–7.7)
Neutrophils Relative %: 72 %
Platelet Count: 192 10*3/uL (ref 150–400)
RBC: 4.56 MIL/uL (ref 4.22–5.81)
RDW: 12.2 % (ref 11.5–15.5)
WBC Count: 6.4 10*3/uL (ref 4.0–10.5)
nRBC: 0 % (ref 0.0–0.2)

## 2020-12-12 LAB — CMP (CANCER CENTER ONLY)
ALT: 50 U/L — ABNORMAL HIGH (ref 0–44)
AST: 34 U/L (ref 15–41)
Albumin: 4.2 g/dL (ref 3.5–5.0)
Alkaline Phosphatase: 57 U/L (ref 38–126)
Anion gap: 10 (ref 5–15)
BUN: 16 mg/dL (ref 6–20)
CO2: 26 mmol/L (ref 22–32)
Calcium: 9.2 mg/dL (ref 8.9–10.3)
Chloride: 103 mmol/L (ref 98–111)
Creatinine: 0.92 mg/dL (ref 0.61–1.24)
GFR, Estimated: >60 mL/min (ref >60)
Glucose, Bld: 85 mg/dL (ref 70–99)
Potassium: 4.2 mmol/L (ref 3.5–5.1)
Sodium: 139 mmol/L (ref 135–145)
Total Bilirubin: 0.6 mg/dL (ref 0.3–1.2)
Total Protein: 7.1 g/dL (ref 6.5–8.1)

## 2020-12-16 ENCOUNTER — Telehealth: Payer: Self-pay | Admitting: Internal Medicine

## 2020-12-16 NOTE — Telephone Encounter (Signed)
Sch per 12/6 los, pt aware

## 2021-03-02 ENCOUNTER — Encounter: Payer: Self-pay | Admitting: Nurse Practitioner

## 2021-03-02 NOTE — Progress Notes (Signed)
Subjective:  Patient ID: Jerry [name], male    DOB: 24-Aug-1975  Age: 46 y.o. MRN: [id number]  CC: Hyperlipidemia History of lung cancer     HPI Jerry [name] is a 46 year old Caucasian male that presents for follow-up of hyperlipidemia. He was prescribed Crestor 5 mg on 11/28/20 but states he did not begin prescription due to concerns for myalgias and arthralgias. States he has modified diet and increased physical activity. He tells me that if his lipid panel remains elevated he will begin statin therapy. Personal and family history of aortic atherosclerosis.   Jerry [name] has history of non-small cell lung cancer, stage 2 adenocarcinoma. Underwent right upper lung lobectomy 03/29/2020. He is followed by Dr Julien Nordmann, oncologist. Reports he is due to have repeat lung scan June 2023. Denies dyspnea, cp, or weight loss. He no longer smokes cigarettes or vapes nicotine.  Jerry [name] tells me that he experienced an episode of severe abdominal pain while plunging a toilet several weeks ago. States he nearly passed out. He tells me he has a palpable abdominal bulge  that causes intermittent pain.   Lipid/Cholesterol, Follow-up  Last lipid panel Other pertinent labs  Lab Results  Component Value Date   CHOL 232 (H) 11/25/2020   HDL 42 11/25/2020   LDLCALC 161 (H) 11/25/2020   TRIG 158 (H) 11/25/2020   CHOLHDL 5.5 (H) 11/25/2020   Lab Results  Component Value Date   ALT 50 (H) 12/12/2020   AST 34 12/12/2020   PLT 192 12/12/2020   TSH 0.948 08/23/2020     He was last seen for this 3 months ago.  Management since that visit includes diet modification and increased physical activity.  He reports good compliance with treatment. He is not having side effects.   Symptoms: No chest pain No chest pressure/discomfort  No dyspnea No lower extremity edema  No numbness or tingling of extremity No orthopnea  No palpitations No paroxysmal nocturnal dyspnea  No speech difficulty No syncope   Current diet: in  general, a "healthy" diet   Current exercise: walking  The 10-year ASCVD risk score (Arnett DK, et al., 2019) is: 2.5%    Current Outpatient Medications on File Prior to Visit  Medication Sig Dispense Refill   rosuvastatin (CRESTOR) 5 MG tablet Take 1 tablet (5 mg total) by mouth daily. 90 tablet 0   Current Facility-Administered Medications on File Prior to Visit  Medication Dose Route Frequency Provider Last Rate Last Admin   0.9 %  sodium chloride infusion   Intravenous Continuous Curt Bears, MD       0.9 %  sodium chloride infusion   Intravenous Continuous Curt Bears, MD 900 mL/hr at 05/11/20 0940 New Bag at 05/11/20 0940   ondansetron (ZOFRAN) injection 8 mg  8 mg Intravenous Once Ardeen Garland, RN       Past Medical History:  Diagnosis Date   Aortic atherosclerosis ([place]) 08/05/2020   per chest CT   CAD (coronary artery disease), native coronary artery 08/05/2020   Left anterior descending CAD noted on chest CT   Cancer [facility])    Disorder due to vaping    Former smoker    Pneumonia    Past Surgical History:  Procedure Laterality Date   INTERCOSTAL NERVE BLOCK Right 03/28/2020   Procedure: INTERCOSTAL NERVE BLOCK;  Surgeon: [name], MD;  Location: [place];  Service: Thoracic;  Laterality: Right;   LUNG LOBECTOMY Right 03/30/2020   LYMPH NODE DISSECTION Right 03/28/2020   Procedure: LYMPH  NODE DISSECTION;  Surgeon: [name], MD;  Location: [place];  Service: Thoracic;  Laterality: Right;   VIDEO BRONCHOSCOPY WITH ENDOBRONCHIAL NAVIGATION Right 03/02/2020   Procedure: VIDEO BRONCHOSCOPY WITH ENDOBRONCHIAL NAVIGATION;  Surgeon: Garner Nash, DO;  Location: [place];  Service: Pulmonary;  Laterality: Right;   VIDEO BRONCHOSCOPY WITH ENDOBRONCHIAL ULTRASOUND Bilateral 03/02/2020   Procedure: VIDEO BRONCHOSCOPY WITH ENDOBRONCHIAL ULTRASOUND;  Surgeon: Garner Nash, DO;  Location: [place];  Service: Pulmonary;  Laterality: Bilateral;   WISDOM TOOTH  EXTRACTION      Family History  Problem Relation Age of Onset   Stroke Father    Hypertension Father    Hyperlipidemia Father    Arrhythmia Father    Lung cancer Paternal Aunt    Social History   Socioeconomic History   Marital status: Married    Spouse name: Not on file   Number of children: 6   Years of education: Not on file   Highest education level: Not on file  Occupational History   Occupation: Clinical biochemist  Tobacco Use   Smoking status: Former    Packs/day: 1.00    Years: 17.00    Pack years: 17.00    Types: Cigarettes    Start date: 03/01/1991    Quit date: 02/29/2008    Years since quitting: 13.0   Smokeless tobacco: Never  Vaping Use   Vaping Use: Former   Start date: 02/08/2013   Quit date: 02/29/2020   Substances: Nicotine  Substance and Sexual Activity   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   Drug use: Never   Sexual activity: Yes    Partners: Female  Other Topics Concern   Not on file  Social History Narrative   Not on file   Social Determinants of Health   Financial Resource Strain: Not on file  Food Insecurity: Not on file  Transportation Needs: Not on file  Physical Activity: Not on file  Stress: Not on file  Social Connections: Not on file    Review of Systems  Constitutional:  Negative for chills, fatigue, fever and unexpected weight change.  HENT:  Negative for congestion, rhinorrhea, sinus pressure, sneezing and sore throat.   Eyes:  Negative for discharge and visual disturbance.  Respiratory:  Negative for cough, shortness of breath and wheezing.   Cardiovascular:  Negative for chest pain and palpitations.  Gastrointestinal:  Positive for abdominal pain (intermittent). Negative for diarrhea, nausea and vomiting.  Endocrine: Negative for polydipsia, polyphagia and polyuria.  Genitourinary:  Negative for decreased urine volume, difficulty urinating, dysuria, frequency, penile swelling and urgency.  Musculoskeletal:   Negative for back pain, gait problem, joint swelling, neck pain and neck stiffness.  Skin: Negative.   Neurological:  Negative for dizziness, seizures, weakness, numbness and headaches.  Hematological: Negative.   Psychiatric/Behavioral:  Negative for confusion, hallucinations, sleep disturbance and suicidal ideas. The patient is not nervous/anxious and is not hyperactive.     Objective:  BP 124/82    Pulse 62    Temp (!) 97.2 F (36.2 C)    Ht 5\' 11"  (1.803 m)    Wt 300 lb (136.1 kg)    SpO2 97%    BMI 41.84 kg/m    BP/Weight 12/12/2020 11/25/2020 7/79/3903  Systolic BP 009 233 007  Diastolic BP 83 78 60  Wt. (Lbs) 311.31 296 299  BMI 43.42 41.28 41.7    Physical Exam Vitals reviewed.  Constitutional:      Appearance:  He is obese.  HENT:     Head: Normocephalic.     Right Ear: Tympanic membrane normal.     Left Ear: Tympanic membrane normal.     Nose: Nose normal.     Mouth/Throat:     Mouth: Mucous membranes are moist.  Eyes:     Pupils: Pupils are equal, round, and reactive to light.  Cardiovascular:     Rate and Rhythm: Normal rate and regular rhythm.     Pulses: Normal pulses.     Heart sounds: Normal heart sounds.  Pulmonary:     Effort: Pulmonary effort is normal.     Breath sounds: Normal breath sounds.  Abdominal:     General: Bowel sounds are normal.     Palpations: Abdomen is soft.     Hernia: A hernia (ventral hernia) is present.  Musculoskeletal:        General: Normal range of motion.     Cervical back: Neck supple.  Skin:    General: Skin is warm and dry.     Capillary Refill: Capillary refill takes less than 2 seconds.  Neurological:     General: No focal deficit present.     Mental Status: He is alert and oriented to person, place, and time.  Psychiatric:        Mood and Affect: Mood normal.        Behavior: Behavior normal.        Lab Results  Component Value Date   WBC 6.4 12/12/2020   HGB 14.1 12/12/2020   HCT 40.9 12/12/2020   PLT  192 12/12/2020   GLUCOSE 85 12/12/2020   CHOL 232 (H) 11/25/2020   TRIG 158 (H) 11/25/2020   HDL 42 11/25/2020   LDLCALC 161 (H) 11/25/2020   ALT 50 (H) 12/12/2020   AST 34 12/12/2020   NA 139 12/12/2020   K 4.2 12/12/2020   CL 103 12/12/2020   CREATININE 0.92 12/12/2020   BUN 16 12/12/2020   CO2 26 12/12/2020   TSH 0.948 08/23/2020   INR 1.0 03/24/2020   HGBA1C 5.6 11/25/2020      Assessment & Plan:   1. Aortic atherosclerosis (HCC)-not at goal - Comprehensive metabolic panel - CBC with Differential - Lipid panel -continue heart healthy diet -continue daily physical activity  2. History of lung cancer - Comprehensive metabolic panel - CBC with Differential -follow-up with oncology as scheduled  3. Ventral hernia without obstruction or gangrene - Ambulatory referral to General Surgery  4. Cigarette nicotine dependence in remission  5. BMI 40.0-44.9, adult (HCC) - TSH  6. Class 3 severe obesity due to excess calories with serious comorbidity and body mass index (BMI) of 40.0 to 44.9 in adult [facility]) - Comprehensive metabolic panel - CBC with Differential - Lipid panel - TSH   Continue heart healthy diet Continue daily physical activity We will call you with lab results and referral to general surgeon Follow-up 22-months      Follow-up: 66-months, pending lab results  An After Visit Summary was printed and given to the patient.  I, [name], NP, have reviewed all documentation for this visit. The documentation on 03/03/21 for the exam, diagnosis, procedures, and orders are all accurate and complete.    Signed, [name], NP [place] [phone]

## 2021-03-03 ENCOUNTER — Other Ambulatory Visit: Payer: Self-pay

## 2021-03-03 ENCOUNTER — Ambulatory Visit: Payer: BC Managed Care – PPO | Admitting: Nurse Practitioner

## 2021-03-03 ENCOUNTER — Encounter: Payer: Self-pay | Admitting: Internal Medicine

## 2021-03-03 ENCOUNTER — Encounter: Payer: Self-pay | Admitting: Nurse Practitioner

## 2021-03-03 VITALS — BP 124/82 | HR 62 | Temp 97.2°F | Ht 71.0 in | Wt 300.0 lb

## 2021-03-03 DIAGNOSIS — Z85118 Personal history of other malignant neoplasm of bronchus and lung: Secondary | ICD-10-CM

## 2021-03-03 DIAGNOSIS — F17211 Nicotine dependence, cigarettes, in remission: Secondary | ICD-10-CM

## 2021-03-03 DIAGNOSIS — R739 Hyperglycemia, unspecified: Secondary | ICD-10-CM | POA: Diagnosis not present

## 2021-03-03 DIAGNOSIS — Z6841 Body Mass Index (BMI) 40.0 and over, adult: Secondary | ICD-10-CM

## 2021-03-03 DIAGNOSIS — I700 Atherosclerosis of aorta: Secondary | ICD-10-CM | POA: Diagnosis not present

## 2021-03-03 DIAGNOSIS — K439 Ventral hernia without obstruction or gangrene: Secondary | ICD-10-CM | POA: Diagnosis not present

## 2021-03-03 DIAGNOSIS — R7301 Impaired fasting glucose: Secondary | ICD-10-CM | POA: Diagnosis not present

## 2021-03-03 DIAGNOSIS — I2510 Atherosclerotic heart disease of native coronary artery without angina pectoris: Secondary | ICD-10-CM

## 2021-03-03 NOTE — Patient Instructions (Addendum)
Continue heart healthy diet Continue daily physical activity We will call you with lab results and referral to general surgeon Follow-up 13-months  Ventral Hernia A ventral hernia is a bulge of tissue from inside the abdomen that pushes through a weak area of the muscles that form the front wall of the abdomen. The tissues inside the abdomen are inside a sac (peritoneum). These tissues include the small intestine, large intestine, and the fatty tissue that covers the intestines (omentum). Sometimes, the bulge that forms a hernia contains intestines. Other hernias contain only fat. Ventral hernias do not go away without surgical treatment. There are several types of ventral hernias. You may have: A hernia at an incision site from previous abdominal surgery (incisional hernia). A hernia just above the belly button (epigastric hernia), or at the belly button (umbilical hernia). These types of hernias can develop from heavy lifting or straining. A hernia that comes and goes (reducible hernia). It may be visible only when you lift or strain. This type of hernia can be pushed back into the abdomen (reduced). A hernia that traps abdominal tissue inside the hernia (incarcerated hernia). This type of hernia does not reduce. A hernia that cuts off blood flow to the tissues inside the hernia (strangulated hernia). The tissues can start to die if this happens. This is a very painful bulge that cannot be reduced. A strangulated hernia is a medical emergency. What are the causes? This condition is caused by abdominal tissue putting pressure on an area of weakness in the abdominal muscles. What increases the risk? The following factors may make you more likely to develop this condition: Being age 90 or older. Being overweight or obese. Having had previous abdominal surgery, especially if there was an infection after surgery. Having had an injury to the abdominal wall. Frequently lifting or pushing heavy  objects. Having had several pregnancies. Having a buildup of fluid inside the abdomen (ascites). Straining to have a bowel movement or to urinate. Having frequent coughing episodes. What are the signs or symptoms? The only symptom of a ventral hernia may be a painless bulge in the abdomen. A reducible hernia may be visible only when you strain, cough, or lift. Other symptoms may include: Dull pain. A feeling of pressure. Signs and symptoms of a strangulated hernia may include: Increasing pain. Nausea and vomiting. Pain when pressing on the hernia. The skin over the hernia turning red or purple. Constipation. Blood in the stool (feces). How is this diagnosed? This condition may be diagnosed based on: Your symptoms. Your medical history. A physical exam. You may be asked to cough or strain while standing. These actions increase the pressure inside your abdomen and force the hernia through the opening in your muscles. Your health care provider may try to reduce the hernia by gently pushing the hernia back in. Imaging studies, such as an ultrasound or CT scan. How is this treated? This condition is treated with surgery. If you have a strangulated hernia, surgery is done as soon as possible. If your hernia is small and not incarcerated, you may be asked to lose some weight before surgery. Follow these instructions at home: Follow instructions from your health care provider about eating or drinking restrictions. If you are overweight, your health care provider may recommend that you increase your activity level and eat a healthier diet. Do not lift anything that is heavier than 10 lb (4.5 kg), or the limit that you are told, until your health care provider says that  it is safe. Return to your normal activities as told by your health care provider. Ask your health care provider what activities are safe for you. You may need to avoid activities that increase pressure on your hernia. Take  over-the-counter and prescription medicines only as told by your health care provider. Keep all follow-up visits. This is important. Contact a health care provider if: Your hernia gets larger. Your hernia becomes painful. Get help right away if: Your hernia becomes increasingly painful. You have pain along with any of the following: Changes in skin color in the area of the hernia. Nausea. Vomiting. Fever. These symptoms may represent a serious problem that is an emergency. Do not wait to see if the symptoms will go away. Get medical help right away. Call your local emergency services (911 in the [place] Do not drive yourself to the hospital. Summary A ventral hernia is a bulge of tissue from inside the abdomen that pushes through a weak area of the muscles that form the front wall of the abdomen. This condition is treated with surgery, which may be urgent depending on your hernia. Do not lift anything that is heavier than 10 lb (4.5 kg), and follow activity instructions from your health care provider. This information is not intended to replace advice given to you by your health care provider. Make sure you discuss any questions you have with your health care provider. Document Revised: 08/14/2019 Document Reviewed: 08/14/2019 Elsevier Patient Education  2022 [employer]    Preventive Care 12-74 Years Old, Male Preventive care refers to lifestyle choices and visits with your health care provider that can promote health and wellness. Preventive care visits are also called wellness exams. What can I expect for my preventive care visit? Counseling During your preventive care visit, your health care provider may ask about your: Medical history, including: Past medical problems. Family medical history. Current health, including: Emotional well-being. Home life and relationship well-being. Sexual activity. Lifestyle, including: Alcohol, nicotine or tobacco, and drug use. Access to  firearms. Diet, exercise, and sleep habits. Safety issues such as seatbelt and bike helmet use. Sunscreen use. Work and work Statistician. Physical exam Your health care provider will check your: Height and weight. These may be used to calculate your BMI (body mass index). BMI is a measurement that tells if you are at a healthy weight. Waist circumference. This measures the distance around your waistline. This measurement also tells if you are at a healthy weight and may help predict your risk of certain diseases, such as type 2 diabetes and high blood pressure. Heart rate and blood pressure. Body temperature. Skin for abnormal spots. What immunizations do I need? Vaccines are usually given at various ages, according to a schedule. Your health care provider will recommend vaccines for you based on your age, medical history, and lifestyle or other factors, such as travel or where you work. What tests do I need? Screening Your health care provider may recommend screening tests for certain conditions. This may include: Lipid and cholesterol levels. Diabetes screening. This is done by checking your blood sugar (glucose) after you have not eaten for a while (fasting). Hepatitis B test. Hepatitis C test. HIV (human immunodeficiency virus) test. STI (sexually transmitted infection) testing, if you are at risk. Lung cancer screening. Prostate cancer screening. Colorectal cancer screening. Talk with your health care provider about your test results, treatment options, and if necessary, the need for more tests. Follow these instructions at home: Eating and drinking  Eat  a diet that includes fresh fruits and vegetables, whole grains, lean protein, and low-fat dairy products. Take vitamin and mineral supplements as recommended by your health care provider. Do not drink alcohol if your health care provider tells you not to drink. If you drink alcohol: Limit how much you have to 0-2 drinks a  day. Know how much alcohol is in your drink. In the [place] one drink equals one 12 oz bottle of beer (355 mL), one 5 oz glass of wine (148 mL), or one 1 oz glass of hard liquor (44 mL). Lifestyle Brush your teeth every morning and night with fluoride toothpaste. Floss one time each day. Exercise for at least 30 minutes 5 or more days each week. Do not use any products that contain nicotine or tobacco. These products include cigarettes, chewing tobacco, and vaping devices, such as e-cigarettes. If you need help quitting, ask your health care provider. Do not use drugs. If you are sexually active, practice safe sex. Use a condom or other form of protection to prevent STIs. Take aspirin only as told by your health care provider. Make sure that you understand how much to take and what form to take. Work with your health care provider to find out whether it is safe and beneficial for you to take aspirin daily. Find healthy ways to manage stress, such as: Meditation, yoga, or listening to music. Journaling. Talking to a trusted person. Spending time with friends and family. Minimize exposure to UV radiation to reduce your risk of skin cancer. Safety Always wear your seat belt while driving or riding in a vehicle. Do not drive: If you have been drinking alcohol. Do not ride with someone who has been drinking. When you are tired or distracted. While texting. If you have been using any mind-altering substances or drugs. Wear a helmet and other protective equipment during sports activities. If you have firearms in your house, make sure you follow all gun safety procedures. What's next? Go to your health care provider once a year for an annual wellness visit. Ask your health care provider how often you should have your eyes and teeth checked. Stay up to date on all vaccines. This information is not intended to replace advice given to you by your health care provider. Make sure you discuss any  questions you have with your health care provider. Document Revised: 06/22/2020 Document Reviewed: 06/22/2020 Elsevier Patient Education  2022 [place]. Preventing High Cholesterol Cholesterol is a white, waxy substance similar to fat that the human body needs to help build cells. The liver makes all the cholesterol that a person's body needs. Having high cholesterol (hypercholesterolemia) increases your risk for heart disease and stroke. Extra or excess cholesterol comes from the food that you eat. High cholesterol can often be prevented with diet and lifestyle changes. If you already have high cholesterol, you can control it with diet, lifestyle changes, and medicines. How can high cholesterol affect me? If you have high cholesterol, fatty deposits (plaques) may build up on the walls of your blood vessels. The blood vessels that carry blood away from your heart are called arteries. Plaques make the arteries narrower and stiffer. This in turn can: Restrict or block blood flow and cause blood clots to form. Increase your risk for heart attack and stroke. What can increase my risk for high cholesterol? This condition is more likely to develop in people who: Eat foods that are high in saturated fat or cholesterol. Saturated fat is mostly found  in foods that come from animal sources. Are overweight. Are not getting enough exercise. Use products that contain nicotine or tobacco, such as cigarettes, e-cigarettes, and chewing tobacco. Have a family history of high cholesterol (familial hypercholesterolemia). What actions can I take to prevent this? Nutrition  Eat less saturated fat. Avoid trans fats (partially hydrogenated oils). These are often found in margarine and in some baked goods, fried foods, and snacks bought in packages. Avoid precooked or cured meat, such as bacon, sausages, or meat loaves. Avoid foods and drinks that have added sugars. Eat more fruits, vegetables, and whole  grains. Choose healthy sources of protein, such as fish, poultry, lean cuts of red meat, beans, peas, lentils, and nuts. Choose healthy sources of fat, such as: Nuts. Vegetable oils, especially olive oil. Fish that have healthy fats, such as omega-3 fatty acids. These fish include mackerel or salmon. Lifestyle Lose weight if you are overweight. Maintaining a healthy body mass index (BMI) can help prevent or control high cholesterol. It can also lower your risk for diabetes and high blood pressure. Ask your health care provider to help you with a diet and exercise plan to lose weight safely. Do not use any products that contain nicotine or tobacco. These products include cigarettes, chewing tobacco, and vaping devices, such as e-cigarettes. If you need help quitting, ask your health care provider. Alcohol use Do not drink alcohol if: Your health care provider tells you not to drink. You are pregnant, may be pregnant, or are planning to become pregnant. If you drink alcohol: Limit how much you have to: 0-1 drink a day for women. 0-2 drinks a day for men. Know how much alcohol is in your drink. In the [place] one drink equals one 12 oz bottle of beer (355 mL), one 5 oz glass of wine (148 mL), or one 1 oz glass of hard liquor (44 mL). Activity  Get enough exercise. Do exercises as told by your health care provider. Each week, do at least 150 minutes of exercise that takes a medium level of effort (moderate-intensity exercise). This kind of exercise: Makes your heart beat faster while allowing you to still be able to talk. Can be done in short sessions several times a day or longer sessions a few times a week. For example, on 5 days each week, you could walk fast or ride your bike 3 times a day for 10 minutes each time. Medicines Your health care provider may recommend medicines to help lower cholesterol. This may be a medicine to lower the amount of cholesterol that your liver makes. You may need  medicine if: Diet and lifestyle changes have not lowered your cholesterol enough. You have high cholesterol and other risk factors for heart disease or stroke. Take over-the-counter and prescription medicines only as told by your health care provider. General information Manage your risk factors for high cholesterol. Talk with your health care provider about all your risk factors and how to lower your risk. Manage other conditions that you have, such as diabetes or high blood pressure (hypertension). Have blood tests to check your cholesterol levels at regular points in time as told by your health care provider. Keep all follow-up visits. This is important. Where to find more information American Heart Association: www.heart.org National Heart, Lung, and Blood Institute: https://wilson-eaton.com/ Summary High cholesterol increases your risk for heart disease and stroke. By keeping your cholesterol level low, you can reduce your risk for these conditions. High cholesterol can often be prevented with  diet and lifestyle changes. Work with your health care provider to manage your risk factors, and have your blood tested regularly. This information is not intended to replace advice given to you by your health care provider. Make sure you discuss any questions you have with your health care provider. Document Revised: 02/29/2020 Document Reviewed: 02/29/2020 Elsevier Patient Education  2022 [employer].

## 2021-03-04 ENCOUNTER — Encounter: Payer: Self-pay | Admitting: Internal Medicine

## 2021-03-04 LAB — CBC WITH DIFFERENTIAL/PLATELET
Basophils Absolute: 0 10*3/uL (ref 0.0–0.2)
Basos: 1 %
EOS (ABSOLUTE): 0.1 10*3/uL (ref 0.0–0.4)
Eos: 2 %
Hematocrit: 42.7 % (ref 37.5–51.0)
Hemoglobin: 14.4 g/dL (ref 13.0–17.7)
Immature Grans (Abs): 0 10*3/uL (ref 0.0–0.1)
Immature Granulocytes: 0 %
Lymphocytes Absolute: 1.2 10*3/uL (ref 0.7–3.1)
Lymphs: 24 %
MCH: 30.7 pg (ref 26.6–33.0)
MCHC: 33.7 g/dL (ref 31.5–35.7)
MCV: 91 fL (ref 79–97)
Monocytes Absolute: 0.5 10*3/uL (ref 0.1–0.9)
Monocytes: 10 %
Neutrophils Absolute: 3.1 10*3/uL (ref 1.4–7.0)
Neutrophils: 63 %
Platelets: 214 10*3/uL (ref 150–450)
RBC: 4.69 x10E6/uL (ref 4.14–5.80)
RDW: 12.5 % (ref 11.6–15.4)
WBC: 4.8 10*3/uL (ref 3.4–10.8)

## 2021-03-04 LAB — CARDIOVASCULAR RISK ASSESSMENT

## 2021-03-04 LAB — COMPREHENSIVE METABOLIC PANEL
ALT: 15 IU/L (ref 0–44)
AST: 19 IU/L (ref 0–40)
Albumin/Globulin Ratio: 2.1 (ref 1.2–2.2)
Albumin: 4.4 g/dL (ref 4.0–5.0)
Alkaline Phosphatase: 57 IU/L (ref 44–121)
BUN/Creatinine Ratio: 14 (ref 9–20)
BUN: 15 mg/dL (ref 6–24)
Bilirubin Total: 0.4 mg/dL (ref 0.0–1.2)
CO2: 25 mmol/L (ref 20–29)
Calcium: 8.6 mg/dL — ABNORMAL LOW (ref 8.7–10.2)
Chloride: 102 mmol/L (ref 96–106)
Creatinine, Ser: 1.06 mg/dL (ref 0.76–1.27)
Globulin, Total: 2.1 g/dL (ref 1.5–4.5)
Glucose: 104 mg/dL — ABNORMAL HIGH (ref 70–99)
Potassium: 4.2 mmol/L (ref 3.5–5.2)
Sodium: 141 mmol/L (ref 134–144)
Total Protein: 6.5 g/dL (ref 6.0–8.5)
eGFR: 89 mL/min/{1.73_m2} (ref >59)

## 2021-03-04 LAB — LIPID PANEL
Chol/HDL Ratio: 4.8 ratio (ref 0.0–5.0)
Cholesterol, Total: 184 mg/dL (ref 100–199)
HDL: 38 mg/dL — ABNORMAL LOW (ref >39)
LDL Chol Calc (NIH): 126 mg/dL — ABNORMAL HIGH (ref 0–99)
Triglycerides: 108 mg/dL (ref 0–149)
VLDL Cholesterol Cal: 20 mg/dL (ref 5–40)

## 2021-03-04 LAB — TSH: TSH: 0.951 u[IU]/mL (ref 0.450–4.500)

## 2021-03-29 LAB — SPECIMEN STATUS REPORT

## 2021-03-29 LAB — HGB A1C W/O EAG: Hgb A1c MFr Bld: 5.4 % (ref 4.8–5.6)

## 2021-06-02 ENCOUNTER — Encounter: Payer: Self-pay | Admitting: Nurse Practitioner

## 2021-06-02 ENCOUNTER — Ambulatory Visit: Payer: BC Managed Care – PPO | Admitting: Nurse Practitioner

## 2021-06-02 VITALS — BP 128/62 | HR 72 | Temp 97.3°F | Ht 71.0 in | Wt 316.0 lb

## 2021-06-02 DIAGNOSIS — Z9221 Personal history of antineoplastic chemotherapy: Secondary | ICD-10-CM

## 2021-06-02 DIAGNOSIS — Z85118 Personal history of other malignant neoplasm of bronchus and lung: Secondary | ICD-10-CM

## 2021-06-02 DIAGNOSIS — L2089 Other atopic dermatitis: Secondary | ICD-10-CM

## 2021-06-02 MED ORDER — DESONIDE 0.05 % EX CREA: TOPICAL_CREAM | Freq: Two times a day (BID) | CUTANEOUS | 0 refills | Status: DC

## 2021-06-02 MED ORDER — TRIAMCINOLONE ACETONIDE 0.1 % EX CREA: 1.0000 | TOPICAL_CREAM | Freq: Two times a day (BID) | CUTANEOUS | 0 refills | Status: DC

## 2021-06-02 MED ORDER — LORATADINE 10 MG PO TABS: 10.0000 mg | ORAL_TABLET | Freq: Every day | ORAL | 1 refills | Status: DC

## 2021-06-02 MED ORDER — TRIAMCINOLONE ACETONIDE 40 MG/ML IJ SUSP
60.0000 mg | Freq: Once | INTRAMUSCULAR | Status: AC
  Administered 2021-06-02: 60 mg via INTRA_ARTICULAR

## 2021-06-02 NOTE — Patient Instructions (Addendum)
Kenalog 60 mg steroid injection given Apply Triamcinolone cream to neck rash twice daily Apply Desowen cream to eye lid twice daily Take Claritin 10 mg daily for rash Take Benadryl as directed at night as needed for rash   Atopic Dermatitis Atopic dermatitis is a skin disorder that causes inflammation of the skin. It is marked by a red rash and itchy, dry, scaly skin. It is the most common type of eczema. Eczema is a group of skin conditions that cause the skin to become rough and swollen. This condition is generally worse during the cooler winter months and often improves during the warm summer months. Atopic dermatitis usually starts showing signs in infancy and can last through adulthood. This condition cannot be passed from one person to another (is not contagious). Atopic dermatitis may not always be present, but when it is, it is called a flare-up. What are the causes? The exact cause of this condition is not known. Flare-ups may be triggered by: Coming in contact with something that you are sensitive or allergic to (allergen). Stress. Certain foods. Extremely hot or cold weather. Harsh chemicals and soaps. Dry air. Chlorine. What increases the risk? This condition is more likely to develop in people who have a personal or family history of: Eczema. Allergies. Asthma. Hay fever. What are the signs or symptoms? Symptoms of this condition include: Dry, scaly skin. Red, itchy rash. Itchiness, which can be severe. This may occur before the skin rash. This can make sleeping difficult. Skin thickening and cracking that can occur over time. How is this diagnosed? This condition is diagnosed based on: Your symptoms. Your medical history. A physical exam. How is this treated? There is no cure for this condition, but symptoms can usually be controlled. Treatment focuses on: Controlling the itchiness and scratching. You may be given medicines, such as antihistamines or steroid  creams. Limiting exposure to allergens. Recognizing situations that cause stress and developing a plan to manage stress. If your atopic dermatitis does not get better with medicines, or if it is all over your body (widespread), a treatment using a specific type of light (phototherapy) may be used. Follow these instructions at home: Skin care  Keep your skin well moisturized. Doing this seals in moisture and helps to prevent dryness. Use unscented lotions that have petroleum in them. Avoid lotions that contain alcohol or water. They can dry the skin. Keep baths or showers short (less than 5 minutes) in warm water. Do not use hot water. Use mild, unscented cleansers for bathing. Avoid soap and bubble bath. Apply a moisturizer to your skin right after a bath or shower. Do not apply anything to your skin without checking with your health care provider. General instructions Take or apply over-the-counter and prescription medicines only as told by your health care provider. Dress in clothes made of cotton or cotton blends. Dress lightly because heat increases itchiness. When washing your clothes, rinse your clothes twice so all of the soap is removed. Avoid any triggers that can cause a flare-up. Keep your fingernails cut short. Avoid scratching. Scratching makes the rash and itchiness worse. A break in the skin from scratching could result in a skin infection (impetigo). Do not be around people who have cold sores or fever blisters. If you get the infection, it may cause your atopic dermatitis to worsen. Keep all follow-up visits. This is important. Contact a health care provider if: Your itchiness interferes with sleep. Your rash gets worse or is not better within  one week of starting treatment. You have a fever. You have a rash flare-up after having contact with someone who has cold sores or fever blisters. Get help right away if: You develop pus or soft yellow scabs in the rash  area. Summary Atopic dermatitis causes a red rash and itchy, dry, scaly skin. Treatment focuses on controlling the itchiness and scratching, limiting exposure to things that you are sensitive or allergic to (allergens), recognizing situations that cause stress, and developing a plan to manage stress. Keep your skin well moisturized. Keep baths or showers shorter than 5 minutes and use warm water. Do not use hot water. This information is not intended to replace advice given to you by your health care provider. Make sure you discuss any questions you have with your health care provider. Document Revised: 10/05/2019 Document Reviewed: 10/05/2019 Elsevier Patient Education  [place].

## 2021-06-02 NOTE — Progress Notes (Unsigned)
Acute Office Visit  Subjective:    Patient ID: Jerry [name], male    DOB: July 23, 1975, 46 y.o.   MRN: [id number]  Chief Complaint  Patient presents with   Rash    On eyelids, neck, and leg.   HPI: rash  Patient is in today for recurrent rash to bilateral eye lids and left neck Hydrocortisone, shampoo with yeast -zole,  Past Medical History:  Diagnosis Date   Aortic atherosclerosis ([place]) 08/05/2020   per chest CT   CAD (coronary artery disease), native coronary artery 08/05/2020   Left anterior descending CAD noted on chest CT   Cancer [facility])    Disorder due to vaping    Former smoker    Pneumonia     Past Surgical History:  Procedure Laterality Date   INTERCOSTAL NERVE BLOCK Right 03/28/2020   Procedure: INTERCOSTAL NERVE BLOCK;  Surgeon: [name], MD;  Location: [place];  Service: Thoracic;  Laterality: Right;   LUNG LOBECTOMY Right 03/30/2020   LYMPH NODE DISSECTION Right 03/28/2020   Procedure: LYMPH NODE DISSECTION;  Surgeon: [name], MD;  Location: [place];  Service: Thoracic;  Laterality: Right;   [place] Right 03/02/2020   Procedure: VIDEO BRONCHOSCOPY WITH ENDOBRONCHIAL NAVIGATION;  Surgeon: Garner Nash, DO;  Location: [place];  Service: Pulmonary;  Laterality: Right;   VIDEO BRONCHOSCOPY WITH ENDOBRONCHIAL ULTRASOUND Bilateral 03/02/2020   Procedure: VIDEO BRONCHOSCOPY WITH ENDOBRONCHIAL ULTRASOUND;  Surgeon: Garner Nash, DO;  Location: [place];  Service: Pulmonary;  Laterality: Bilateral;   WISDOM TOOTH EXTRACTION      Family History  Problem Relation Age of Onset   Stroke Father    Hypertension Father    Hyperlipidemia Father    Arrhythmia Father    Lung cancer Paternal Aunt     Social History   Socioeconomic History   Marital status: Married    Spouse name: Not on file   Number of children: 6   Years of education: Not on file   Highest education level: Not on file  Occupational  History   Occupation: Clinical biochemist  Tobacco Use   Smoking status: Former    Packs/day: 1.00    Years: 17.00    Pack years: 17.00    Types: Cigarettes    Start date: 03/01/1991    Quit date: 02/29/2008    Years since quitting: 13.2   Smokeless tobacco: Never  Vaping Use   Vaping Use: Former   Start date: 02/08/2013   Quit date: 02/29/2020   Substances: Nicotine  Substance and Sexual Activity   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   Drug use: Never   Sexual activity: Yes    Partners: Female  Other Topics Concern   Not on file  Social History Narrative   Not on file   Social Determinants of Health   Financial Resource Strain: Not on file  Food Insecurity: Not on file  Transportation Needs: Not on file  Physical Activity: Not on file  Stress: Not on file  Social Connections: Not on file  Intimate Partner Violence: Not on file    No outpatient medications prior to visit.   Facility-Administered Medications Prior to Visit  Medication Dose Route Frequency Provider Last Rate Last Admin   0.9 %  sodium chloride infusion   Intravenous Continuous Curt Bears, MD       0.9 %  sodium chloride infusion   Intravenous Continuous Curt Bears, MD 900  mL/hr at 05/11/20 0940 New Bag at 05/11/20 0940   ondansetron (ZOFRAN) injection 8 mg  8 mg Intravenous Once Bell, Diane H, RN        Allergies  Allergen Reactions   Olanzapine Other (See Comments)    "Nightmares, night sweats"    Review of Systems  Constitutional:  Negative for appetite change, fatigue and fever.  HENT:  Negative for congestion, ear pain, sinus pressure and sore throat.   Respiratory:  Negative for cough, chest tightness, shortness of breath and wheezing.   Cardiovascular:  Negative for chest pain and palpitations.  Gastrointestinal:  Negative for abdominal pain, constipation, diarrhea, nausea and vomiting.  Genitourinary:  Negative for dysuria and hematuria.  Musculoskeletal:  Negative  for arthralgias, back pain, joint swelling and myalgias.  Skin:  Positive for rash (Left eye lid, left side of neck, left leg).  Neurological:  Negative for dizziness, weakness and headaches.  Psychiatric/Behavioral:  Negative for dysphoric mood. The patient is not nervous/anxious.       Objective:    Physical Exam Vitals reviewed.  Constitutional:      Appearance: Normal appearance.  Skin:    General: Skin is warm.     Capillary Refill: Capillary refill takes less than 2 seconds.     Findings: Rash present.     Comments: Dry scaly rash noted to bilateral eyelids and left neck, collar bone  Neurological:     General: No focal deficit present.     Mental Status: He is alert and oriented to person, place, and time.  Psychiatric:        Mood and Affect: Mood normal.        Behavior: Behavior normal.    BP 128/62 (BP Location: Left Arm, Patient Position: Sitting)   Pulse 72   Temp (!) 97.3 F (36.3 C) (Temporal)   Ht _0  (1.803 m)   Wt (!) 316 lb (143.3 kg)   SpO2 98%   BMI 44.07 kg/m  Wt Readings from Last 3 Encounters:  06/02/21 (!) 316 lb (143.3 kg)  03/03/21 300 lb (136.1 kg)  12/12/20 (!) 311 lb 5 oz (141.2 kg)    Health Maintenance Due  Topic Date Due   HIV Screening  Never done   Hepatitis C Screening  Never done   TETANUS/TDAP  Never done   COVID-19 Vaccine (3 - Pfizer risk series) 10/21/2019    There are no preventive care reminders to display for this patient.   Lab Results  Component Value Date   TSH 0.951 03/03/2021   Lab Results  Component Value Date   WBC 4.8 03/03/2021   HGB 14.4 03/03/2021   HCT 42.7 03/03/2021   MCV 91 03/03/2021   PLT 214 03/03/2021   Lab Results  Component Value Date   NA 141 03/03/2021   K 4.2 03/03/2021   CO2 25 03/03/2021   GLUCOSE 104 (H) 03/03/2021   BUN 15 03/03/2021   CREATININE 1.06 03/03/2021   BILITOT 0.4 03/03/2021   ALKPHOS 57 03/03/2021   AST 19 03/03/2021   ALT 15 03/03/2021   PROT 6.5  03/03/2021   ALBUMIN 4.4 03/03/2021   CALCIUM 8.6 (L) 03/03/2021   ANIONGAP 10 12/12/2020   EGFR 89 03/03/2021   Lab Results  Component Value Date   CHOL 184 03/03/2021   Lab Results  Component Value Date   HDL 38 (L) 03/03/2021   Lab Results  Component Value Date   LDLCALC 126 (H) 03/03/2021   Lab  Results  Component Value Date   TRIG 108 03/03/2021   Lab Results  Component Value Date   CHOLHDL 4.8 03/03/2021   Lab Results  Component Value Date   HGBA1C 5.4 03/03/2021         Assessment & Plan:   1. Other atopic dermatitis - triamcinolone acetonide (KENALOG-40) injection 60 mg - desonide (DESOWEN) 0.05 % cream; Apply topically 2 (two) times daily. Apply to eyelid twice daily  Dispense: 30 g; Refill: 0 - triamcinolone cream (KENALOG) 0.1 %; Apply 1 application. topically 2 (two) times daily.  Dispense: 30 g; Refill: 0 - loratadine (CLARITIN) 10 MG tablet; Take 1 tablet (10 mg total) by mouth daily.  Dispense: 90 tablet; Refill: 1   Follow-up: As needed   Kenalog 60 mg steroid injection given Apply Triamcinolone cream to neck rash twice daily Apply Desowen cream to eye lid twice daily Take Claritin 10 mg daily for rash Take Benadryl as directed at night as needed for rash   I,Lauren M Auman,acting as a Education administrator for [employer], NP.,have documented all relevant documentation on the behalf of [name], NP,as directed by  [name], NP while in the presence of [name], NP.   Oleta Mouse, CMA  I, [name], NP, have reviewed all documentation for this visit. The documentation on 06/02/21 for the exam, diagnosis, procedures, and orders are all accurate and complete.    SignedJerrell Belfast, DNP 06/02/21 at 10:15 AM

## 2021-06-09 ENCOUNTER — Inpatient Hospital Stay: Payer: BC Managed Care – PPO | Attending: Internal Medicine

## 2021-06-09 ENCOUNTER — Ambulatory Visit (HOSPITAL_COMMUNITY)
Admission: RE | Admit: 2021-06-09 | Discharge: 2021-06-09 | Disposition: A | Discharge status: Home / Self Care | Payer: BC Managed Care – PPO | Source: Ambulatory Visit | Attending: Physician Assistant | Admitting: Physician Assistant

## 2021-06-09 ENCOUNTER — Other Ambulatory Visit: Payer: 59

## 2021-06-09 ENCOUNTER — Other Ambulatory Visit: Payer: Self-pay

## 2021-06-09 DIAGNOSIS — C3491 Malignant neoplasm of unspecified part of right bronchus or lung: Secondary | ICD-10-CM | POA: Insufficient documentation

## 2021-06-09 DIAGNOSIS — R918 Other nonspecific abnormal finding of lung field: Secondary | ICD-10-CM | POA: Diagnosis not present

## 2021-06-09 DIAGNOSIS — I700 Atherosclerosis of aorta: Secondary | ICD-10-CM | POA: Diagnosis not present

## 2021-06-09 LAB — CBC WITH DIFFERENTIAL (CANCER CENTER ONLY)
Abs Immature Granulocytes: 0.06 10*3/uL (ref 0.00–0.07)
Basophils Absolute: 0.1 10*3/uL (ref 0.0–0.1)
Basophils Relative: 1 %
Eosinophils Absolute: 0.1 10*3/uL (ref 0.0–0.5)
Eosinophils Relative: 1 %
HCT: 42.4 % (ref 39.0–52.0)
Hemoglobin: 14.6 g/dL (ref 13.0–17.0)
Immature Granulocytes: 1 %
Lymphocytes Relative: 19 %
Lymphs Abs: 2 10*3/uL (ref 0.7–4.0)
MCH: 30.7 pg (ref 26.0–34.0)
MCHC: 34.4 g/dL (ref 30.0–36.0)
MCV: 89.3 fL (ref 80.0–100.0)
Monocytes Absolute: 0.9 10*3/uL (ref 0.1–1.0)
Monocytes Relative: 9 %
Neutro Abs: 7 10*3/uL (ref 1.7–7.7)
Neutrophils Relative %: 69 %
Platelet Count: 239 10*3/uL (ref 150–400)
RBC: 4.75 MIL/uL (ref 4.22–5.81)
RDW: 13 % (ref 11.5–15.5)
WBC Count: 10.1 10*3/uL (ref 4.0–10.5)
nRBC: 0 % (ref 0.0–0.2)

## 2021-06-09 LAB — CMP (CANCER CENTER ONLY)
ALT: 26 U/L (ref 0–44)
AST: 20 U/L (ref 15–41)
Albumin: 4.3 g/dL (ref 3.5–5.0)
Alkaline Phosphatase: 45 U/L (ref 38–126)
Anion gap: 9 (ref 5–15)
BUN: 21 mg/dL — ABNORMAL HIGH (ref 6–20)
CO2: 28 mmol/L (ref 22–32)
Calcium: 9.8 mg/dL (ref 8.9–10.3)
Chloride: 105 mmol/L (ref 98–111)
Creatinine: 1.09 mg/dL (ref 0.61–1.24)
GFR, Estimated: >60 mL/min (ref >60)
Glucose, Bld: 100 mg/dL — ABNORMAL HIGH (ref 70–99)
Potassium: 4 mmol/L (ref 3.5–5.1)
Sodium: 142 mmol/L (ref 135–145)
Total Bilirubin: 0.5 mg/dL (ref 0.3–1.2)
Total Protein: 7.4 g/dL (ref 6.5–8.1)

## 2021-06-09 MED ORDER — SODIUM CHLORIDE (PF) 0.9 % IJ SOLN
INTRAMUSCULAR | Status: AC
  Filled 2021-06-09: qty 50

## 2021-06-09 MED ORDER — IOHEXOL 300 MG/ML  SOLN
75.0000 mL | Freq: Once | INTRAMUSCULAR | Status: AC | PRN
  Administered 2021-06-09: 75 mL via INTRAVENOUS

## 2021-06-13 ENCOUNTER — Ambulatory Visit: Payer: 59 | Admitting: Internal Medicine

## 2021-06-13 ENCOUNTER — Inpatient Hospital Stay: Payer: BC Managed Care – PPO | Admitting: Internal Medicine

## 2021-06-20 ENCOUNTER — Other Ambulatory Visit: Payer: Self-pay

## 2021-06-20 ENCOUNTER — Encounter: Payer: Self-pay | Admitting: Internal Medicine

## 2021-06-20 ENCOUNTER — Inpatient Hospital Stay: Payer: BC Managed Care – PPO | Admitting: Internal Medicine

## 2021-06-20 VITALS — BP 127/85 | HR 69 | Temp 97.2°F | Resp 16 | Wt 316.1 lb

## 2021-06-20 DIAGNOSIS — C3411 Malignant neoplasm of upper lobe, right bronchus or lung: Secondary | ICD-10-CM | POA: Diagnosis not present

## 2021-06-20 DIAGNOSIS — Z87891 Personal history of nicotine dependence: Secondary | ICD-10-CM | POA: Insufficient documentation

## 2021-06-20 DIAGNOSIS — Z9221 Personal history of antineoplastic chemotherapy: Secondary | ICD-10-CM | POA: Diagnosis not present

## 2021-06-20 DIAGNOSIS — C3491 Malignant neoplasm of unspecified part of right bronchus or lung: Secondary | ICD-10-CM | POA: Diagnosis not present

## 2021-06-20 DIAGNOSIS — C3490 Malignant neoplasm of unspecified part of unspecified bronchus or lung: Secondary | ICD-10-CM | POA: Diagnosis not present

## 2021-06-20 DIAGNOSIS — Z902 Acquired absence of lung [part of]: Secondary | ICD-10-CM | POA: Diagnosis not present

## 2021-06-20 NOTE — Progress Notes (Signed)
[place] [phone]   [phone]  OFFICE PROGRESS NOTE  [name], NP [place]. [address] [place] [address]  DIAGNOSIS: Stage IIA (T2b, N0, M0) non-small cell lung cancer, adenocarcinoma presented with right upper lobe lung mass diagnosed in February 2022.  Biomarker Findings Microsatellite status - MS-Stable Tumor Mutational Burden - 4 Muts/Mb Genomic Findings For a complete list of the genes assayed, please refer to the Appendix. KRAS G12D KEAP1 P278S 7 Disease relevant genes with no reportable alterations: ALK, BRAF, EGFR, ERBB2, MET, RET, ROS1  PDL1 Expression 5%.  PRIOR THERAPY:  1) Status post robotic assisted right video thoracoscopy with right upper lobectomy and mediastinal lymph node sampling under the care of Dr. Kipp Brood on March 28, 2020. 2) Adjuvant systemic chemotherapy with cisplatin 75 Mg/M2 and Alimta 500 Mg/M2 every 3 weeks.  First dose May 05, 2020.  Status post 4 cycles.  Last dose was giving July 07, 2020.  Cisplatin was discontinued after cycle #2 secondary to uncontrolled nausea and vomiting and it was replaced with carboplatin for the last 2 cycles.  CURRENT THERAPY: Observation.  INTERVAL HISTORY: Jerry [name] 46 y.o. male returns to the clinic today for follow-up visit.  The patient is feeling fine today with no concerning complaints.  He has 1 episode of cough and chest congestion few days ago.  He denied having any current chest pain, cough, shortness of breath or hemoptysis.  He has no weight loss or night sweats.  He has no headache or visual changes.  He has no nausea, vomiting, diarrhea or constipation.  He had repeat CT scan of the chest performed recently and he is here for evaluation and discussion of his scan results.  MEDICAL HISTORY: Past Medical History:  Diagnosis Date   Aortic atherosclerosis ([place]) 08/05/2020   per chest CT   CAD (coronary artery disease), native coronary  artery 08/05/2020   Left anterior descending CAD noted on chest CT   Cancer [facility])    Disorder due to vaping    Former smoker    Pneumonia     ALLERGIES:  is allergic to olanzapine.  MEDICATIONS:  Current Outpatient Medications  Medication Sig Dispense Refill   desonide (DESOWEN) 0.05 % cream Apply topically 2 (two) times daily. Apply to eyelid twice daily 30 g 0   loratadine (CLARITIN) 10 MG tablet Take 1 tablet (10 mg total) by mouth daily. 90 tablet 1   triamcinolone cream (KENALOG) 0.1 % Apply 1 application. topically 2 (two) times daily. 30 g 0   No current facility-administered medications for this visit.   Facility-Administered Medications Ordered in Other Visits  Medication Dose Route Frequency Provider Last Rate Last Admin   0.9 %  sodium chloride infusion   Intravenous Continuous Curt Bears, MD       0.9 %  sodium chloride infusion   Intravenous Continuous Curt Bears, MD 900 mL/hr at 05/11/20 0940 New Bag at 05/11/20 0940   ondansetron (ZOFRAN) injection 8 mg  8 mg Intravenous Once Ardeen Garland, RN        SURGICAL HISTORY:  Past Surgical History:  Procedure Laterality Date   INTERCOSTAL NERVE BLOCK Right 03/28/2020   Procedure: INTERCOSTAL NERVE BLOCK;  Surgeon: [name], MD;  Location: [place];  Service: Thoracic;  Laterality: Right;   LUNG LOBECTOMY Right 03/30/2020   LYMPH NODE DISSECTION Right 03/28/2020   Procedure: LYMPH NODE DISSECTION;  Surgeon: [name], MD;  [facility];  Service: Thoracic;  Laterality: Right;   VIDEO BRONCHOSCOPY WITH ENDOBRONCHIAL NAVIGATION Right 03/02/2020   Procedure: VIDEO BRONCHOSCOPY WITH ENDOBRONCHIAL NAVIGATION;  Surgeon: Garner Nash, DO;  Location: [place];  Service: Pulmonary;  Laterality: Right;   VIDEO BRONCHOSCOPY WITH ENDOBRONCHIAL ULTRASOUND Bilateral 03/02/2020   Procedure: VIDEO BRONCHOSCOPY WITH ENDOBRONCHIAL ULTRASOUND;  Surgeon: Garner Nash, DO;  Location: [place];  Service:  Pulmonary;  Laterality: Bilateral;   WISDOM TOOTH EXTRACTION      REVIEW OF SYSTEMS:  Constitutional: negative Eyes: negative Ears, nose, mouth, throat, and face: negative Respiratory: negative Cardiovascular: negative Gastrointestinal: negative Genitourinary:negative Integument/breast: negative Hematologic/lymphatic: negative Musculoskeletal:negative Neurological: negative Behavioral/Psych: negative Endocrine: negative Allergic/Immunologic: negative   PHYSICAL EXAMINATION: General appearance: alert, cooperative, and no distress Head: Normocephalic, without obvious abnormality, atraumatic Neck: no adenopathy, no JVD, supple, symmetrical, trachea midline, and thyroid not enlarged, symmetric, no tenderness/mass/nodules Lymph nodes: Cervical, supraclavicular, and axillary nodes normal. Resp: clear to auscultation bilaterally Back: symmetric, no curvature. ROM normal. No CVA tenderness. Cardio: regular rate and rhythm, S1, S2 normal, no murmur, click, rub or gallop GI: soft, non-tender; bowel sounds normal; no masses,  no organomegaly Extremities: extremities normal, atraumatic, no cyanosis or edema Neurologic: Alert and oriented X 3, normal strength and tone. Normal symmetric reflexes. Normal coordination and gait  ECOG PERFORMANCE STATUS: 1 - Symptomatic but completely ambulatory  Blood pressure 127/85, pulse 69, temperature (!) 97.2 F (36.2 C), temperature source Tympanic, resp. rate 16, weight (!) 316 lb 1 oz (143.4 kg), SpO2 100 %.  LABORATORY DATA: Lab Results  Component Value Date   WBC 10.1 06/09/2021   HGB 14.6 06/09/2021   HCT 42.4 06/09/2021   MCV 89.3 06/09/2021   PLT 239 06/09/2021      Chemistry      Component Value Date/Time   NA 142 06/09/2021 1520   NA 141 03/03/2021 0756   K 4.0 06/09/2021 1520   CL 105 06/09/2021 1520   CO2 28 06/09/2021 1520   BUN 21 (H) 06/09/2021 1520   BUN 15 03/03/2021 0756   CREATININE 1.09 06/09/2021 1520      Component  Value Date/Time   CALCIUM 9.8 06/09/2021 1520   ALKPHOS 45 06/09/2021 1520   AST 20 06/09/2021 1520   ALT 26 06/09/2021 1520   BILITOT 0.5 06/09/2021 1520       RADIOGRAPHIC STUDIES: CT Chest W Contrast  Result Date: 06/12/2021 CLINICAL DATA:  Adenocarcinoma of right lung, stage II. Increased acid reflux. Right upper lobe primary with right upper lobectomy 03/28/2020. No prior immunotherapy or radiation therapy. Prior chemotherapy. * Tracking Code: BO * EXAM: CT CHEST WITH CONTRAST TECHNIQUE: Multidetector CT imaging of the chest was performed during intravenous contrast administration. RADIATION DOSE REDUCTION: This exam was performed according to the departmental dose-optimization program which includes automated exposure control, adjustment of the mA and/or kV according to patient size and/or use of iterative reconstruction technique. CONTRAST:  62m OMNIPAQUE IOHEXOL 300 MG/ML  SOLN COMPARISON:  12/07/2020 FINDINGS: Cardiovascular: Aortic atherosclerosis. Normal heart size, without pericardial effusion. Left main and LAD coronary artery calcification. No central pulmonary embolism, on this non-dedicated study. Mediastinum/Nodes: No supraclavicular adenopathy. No mediastinal or hilar adenopathy. Lungs/Pleura: No pleural fluid.  Right upper lobectomy. Development of clustered nodularity within the right middle lobe, including superiorly on 54/7 and more inferiorly on 65/7. Index 3 mm soft tissue nodule on 67/7. Inferior right middle lobe mixed attenuation nodule of 2.2 cm on 84/7 with a possible solid component  on 86/7. Right lower lobe mixed attenuation nodule of 2.0 cm on 85/7 with a 4 mm solid component on 84/7. Upper Abdomen: Mild hepatic steatosis. Nonspecific caudate lobe enlargement. Normal imaged portions of the spleen, stomach, pancreas, adrenal glands, kidneys Musculoskeletal: Mild bilateral gynecomastia. No acute osseous abnormality. IMPRESSION: 1. Status post right upper lobectomy. 2. Right  middle lobe areas of clustered nodularity are suspicious for infectious bronchiolitis. 3. Right middle and right lower lobe mixed attenuation nodules are new in the interval. Suspicious for metachronous or metastatic disease in this patient with a history of adenocarcinoma. Less likely, these areas could also represent infection or inflammation. Consider antibiotic therapy and CT follow-up at 6-12 weeks. 4. No thoracic adenopathy. 5. Age advanced coronary artery atherosclerosis. Recommend assessment of coronary risk factors. 6. Hepatic steatosis. Electronically Signed   By: Abigail Miyamoto M.D.   On: 06/12/2021 08:59     ASSESSMENT AND PLAN: This is a very pleasant 46 years old white male recently diagnosed with a stage IIa (T2b, N0, M0) non-small cell lung cancer, adenocarcinoma status post right upper lobectomy with lymph node sampling under the care of Dr. Kipp Brood on March 28, 2020. The patient had molecular studies by foundation 1 that showed no actionable mutation and his PD-L1 expression was 5%. The patient underwent adjuvant systemic chemotherapy with cisplatin 75 Mg/M2 and Alimta 500 Mg/M2.  Status post 4 cycles.  Cisplatin was replaced with carboplatin starting from cycle #3.  The patient tolerated his treatment well except for significant nausea and vomiting during the cisplatin treatment. He is currently on observation. The patient had repeat CT scan of the chest performed recently.  I personally and independently reviewed the scan images and discussed the result and showed the images to the patient today. His scan showed no concerning findings for disease recurrence or metastasis but there was new right middle and right lower lobe mixed attenuation nodules suspicious for metastatic disease but inflammatory process could not be completely excluded. I recommended for the patient to have repeat CT scan of the chest in 3 months for further evaluation of these lesions.  This lesion are still below the  detection limit for a PET scan especially with a small solid component. The patient was also advised to call immediately if he has any concerning symptoms in the interval. The patient voices understanding of current disease status and treatment options and is in agreement with the current care plan. The total time spent in the appointment was 30 minutes.  All questions were answered. The patient knows to call the clinic with any problems, questions or concerns. We can certainly see the patient much sooner if necessary.   Disclaimer: This note was dictated with voice recognition software. Similar sounding words can inadvertently be transcribed and may not be corrected upon review.

## 2021-07-03 ENCOUNTER — Encounter: Payer: Self-pay | Admitting: Family Medicine

## 2021-07-03 ENCOUNTER — Ambulatory Visit: Payer: BC Managed Care – PPO | Admitting: Family Medicine

## 2021-07-03 VITALS — BP 118/72 | HR 84 | Temp 97.4°F | Resp 18 | Ht 71.0 in | Wt 323.0 lb

## 2021-07-03 DIAGNOSIS — S86111A Strain of other muscle(s) and tendon(s) of posterior muscle group at lower leg level, right leg, initial encounter: Secondary | ICD-10-CM

## 2021-07-03 DIAGNOSIS — H65192 Other acute nonsuppurative otitis media, left ear: Secondary | ICD-10-CM

## 2021-07-03 DIAGNOSIS — H6690 Otitis media, unspecified, unspecified ear: Secondary | ICD-10-CM | POA: Insufficient documentation

## 2021-07-03 HISTORY — DX: Otitis media, unspecified, unspecified ear: H66.90

## 2021-07-03 MED ORDER — AMOXICILLIN 875 MG PO TABS: 875.0000 mg | ORAL_TABLET | Freq: Two times a day (BID) | ORAL | 0 refills | Status: DC

## 2021-07-03 MED ORDER — MELOXICAM 15 MG PO TABS: 15.0000 mg | ORAL_TABLET | Freq: Every day | ORAL | 1 refills | Status: DC

## 2021-07-07 ENCOUNTER — Other Ambulatory Visit: Payer: Self-pay | Admitting: Physician Assistant

## 2021-07-19 ENCOUNTER — Encounter: Payer: Self-pay | Admitting: Nurse Practitioner

## 2021-07-19 ENCOUNTER — Telehealth: Payer: Self-pay | Admitting: Internal Medicine

## 2021-07-19 ENCOUNTER — Ambulatory Visit: Payer: BC Managed Care – PPO | Admitting: Nurse Practitioner

## 2021-07-19 VITALS — BP 122/72 | HR 61 | Temp 97.8°F | Ht 71.0 in | Wt 324.0 lb

## 2021-07-19 DIAGNOSIS — H9202 Otalgia, left ear: Secondary | ICD-10-CM

## 2021-07-19 DIAGNOSIS — H6505 Acute serous otitis media, recurrent, left ear: Secondary | ICD-10-CM | POA: Diagnosis not present

## 2021-07-19 DIAGNOSIS — H6122 Impacted cerumen, left ear: Secondary | ICD-10-CM

## 2021-07-19 DIAGNOSIS — S86111D Strain of other muscle(s) and tendon(s) of posterior muscle group at lower leg level, right leg, subsequent encounter: Secondary | ICD-10-CM | POA: Diagnosis not present

## 2021-07-19 MED ORDER — NEOMYCIN-POLYMYXIN-HC 3.5-10000-1 OT SOLN: 3.0000 [drp] | Freq: Four times a day (QID) | OTIC | 0 refills | Status: DC

## 2021-07-19 NOTE — Telephone Encounter (Signed)
Called patient regarding upcoming September appointments, patient has been and notified.

## 2021-07-19 NOTE — Progress Notes (Signed)
Subjective:  Patient ID: Jerry [name], male    DOB: 27-Feb-1975  Age: 46 y.o. MRN: [id number]  Chief Complaint  Patient presents with   Tendon Rupture 2-3 week follow up    HPI  Pt presents for follow-up of right gastrocnemius tendon rupture. Pt reports right leg pain has subsided. Report left ear pain/pressure. Pt was treated with a course of Amoxicillin on 07/03/21. States he has a history of left ear OM as an adult.  Current Outpatient Medications on File Prior to Visit  Medication Sig Dispense Refill   desonide (DESOWEN) 0.05 % cream Apply topically 2 (two) times daily. Apply to eyelid twice daily 30 g 0   loratadine (CLARITIN) 10 MG tablet Take 1 tablet (10 mg total) by mouth daily. 90 tablet 1   meloxicam (MOBIC) 15 MG tablet Take 1 tablet (15 mg total) by mouth daily. 30 tablet 1   triamcinolone cream (KENALOG) 0.1 % Apply 1 application. topically 2 (two) times daily. 30 g 0   Current Facility-Administered Medications on File Prior to Visit  Medication Dose Route Frequency Provider Last Rate Last Admin   0.9 %  sodium chloride infusion   Intravenous Continuous Curt Bears, MD       0.9 %  sodium chloride infusion   Intravenous Continuous Curt Bears, MD 900 mL/hr at 05/11/20 0940 New Bag at 05/11/20 0940   ondansetron (ZOFRAN) injection 8 mg  8 mg Intravenous Once Ardeen Garland, RN       Past Medical History:  Diagnosis Date   Aortic atherosclerosis ([place]) 08/05/2020   per chest CT   CAD (coronary artery disease), native coronary artery 08/05/2020   Left anterior descending CAD noted on chest CT   Cancer [facility])    Disorder due to vaping    Former smoker    Pneumonia    Past Surgical History:  Procedure Laterality Date   INTERCOSTAL NERVE BLOCK Right 03/28/2020   Procedure: INTERCOSTAL NERVE BLOCK;  Surgeon: [name], MD;  Location: [place];  Service: Thoracic;  Laterality: Right;   LUNG LOBECTOMY Right 03/30/2020   LYMPH NODE DISSECTION Right 03/28/2020    Procedure: LYMPH NODE DISSECTION;  Surgeon: [name], MD;  Location: [place];  Service: Thoracic;  Laterality: Right;   VIDEO BRONCHOSCOPY WITH ENDOBRONCHIAL NAVIGATION Right 03/02/2020   Procedure: VIDEO BRONCHOSCOPY WITH ENDOBRONCHIAL NAVIGATION;  Surgeon: Garner Nash, DO;  Location: [place];  Service: Pulmonary;  Laterality: Right;   VIDEO BRONCHOSCOPY WITH ENDOBRONCHIAL ULTRASOUND Bilateral 03/02/2020   Procedure: VIDEO BRONCHOSCOPY WITH ENDOBRONCHIAL ULTRASOUND;  Surgeon: Garner Nash, DO;  Location: [place];  Service: Pulmonary;  Laterality: Bilateral;   WISDOM TOOTH EXTRACTION      Family History  Problem Relation Age of Onset   Stroke Father    Hypertension Father    Hyperlipidemia Father    Arrhythmia Father    Lung cancer Paternal Aunt    Social History   Socioeconomic History   Marital status: Married    Spouse name: Not on file   Number of children: 6   Years of education: Not on file   Highest education level: Not on file  Occupational History   Occupation: Clinical biochemist  Tobacco Use   Smoking status: Former    Packs/day: 1.00    Years: 17.00    Total pack years: 17.00    Types: Cigarettes    Start date: 03/01/1991    Quit date: 02/29/2008    Years since quitting: 71.3  Smokeless tobacco: Never  Vaping Use   Vaping Use: Former   Start date: 02/08/2013   Quit date: 02/29/2020   Substances: Nicotine  Substance and Sexual Activity   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   Drug use: Never   Sexual activity: Yes    Partners: Female  Other Topics Concern   Not on file  Social History Narrative   Not on file   Social Determinants of Health   Financial Resource Strain: Not on file  Food Insecurity: Not on file  Transportation Needs: Not on file  Physical Activity: Not on file  Stress: Not on file  Social Connections: Not on file    Review of Systems  Constitutional:  Negative for appetite change, fatigue and fever.  HENT:   Negative for congestion, ear pain, sinus pressure and sore throat.   Respiratory:  Negative for cough, chest tightness, shortness of breath and wheezing.   Cardiovascular:  Negative for chest pain and palpitations.  Gastrointestinal:  Negative for abdominal pain, constipation, diarrhea, nausea and vomiting.  Genitourinary:  Negative for dysuria and hematuria.  Musculoskeletal:  Negative for arthralgias, back pain, joint swelling and myalgias.  Skin:  Negative for rash.  Neurological:  Negative for dizziness, weakness and headaches.  Psychiatric/Behavioral:  Negative for dysphoric mood. The patient is not nervous/anxious.      Objective:  BP 122/72 (BP Location: Left Arm, Patient Position: Sitting)   Pulse 61   Temp 97.8 F (36.6 C) (Temporal)   Ht 5\' 11"  (1.803 m)   Wt (!) 324 lb (147 kg)   SpO2 96%   BMI 45.19 kg/m      07/19/2021    8:18 AM 07/03/2021    3:48 PM 06/20/2021    8:29 AM  BP/Weight  Systolic BP 270 623 762  Diastolic BP 72 72 85  Wt. (Lbs) 324 323 316.06  BMI 45.19 kg/m2 45.05 kg/m2 44.08 kg/m2    Physical Exam Vitals reviewed.  HENT:     Right Ear: Tympanic membrane normal.     Left Ear: Swelling and tenderness present. There is impacted cerumen. Tympanic membrane is erythematous.  Musculoskeletal:        General: Normal range of motion.  Skin:    General: Skin is warm and dry.  Neurological:     Mental Status: He is alert.     Lab Results  Component Value Date   WBC 10.1 06/09/2021   HGB 14.6 06/09/2021   HCT 42.4 06/09/2021   PLT 239 06/09/2021   GLUCOSE 100 (H) 06/09/2021   CHOL 184 03/03/2021   TRIG 108 03/03/2021   HDL 38 (L) 03/03/2021   LDLCALC 126 (H) 03/03/2021   ALT 26 06/09/2021   AST 20 06/09/2021   NA 142 06/09/2021   K 4.0 06/09/2021   CL 105 06/09/2021   CREATININE 1.09 06/09/2021   BUN 21 (H) 06/09/2021   CO2 28 06/09/2021   TSH 0.951 03/03/2021   INR 1.0 03/24/2020   HGBA1C 5.4 03/03/2021      Assessment & Plan:    1. Recurrent acute serous otitis media of left ear - neomycin-polymyxin-hydrocortisone (CORTISPORIN) OTIC solution; Place 3 drops into the left ear 4 (four) times daily.  Dispense: 10 mL; Refill: 0  2. Rupture of right gastrocnemius tendon, subsequent encounter-improved  3. Left ear impacted cerumen -instill Debrox to left ear nightly for 1 week  4. Otalgia of left ear -Tylenol or Ibuprofen    Instill  Cortisporin drops to left ear 4 times daily for 3 days Use over the counter Debrox drops to left ear Follow-up as needed  Follow-up: PRN  An After Visit Summary was printed and given to the patient.   I,Lauren M Auman,acting as a Education administrator for [employer], NP.,have documented all relevant documentation on the behalf of [name], NP,as directed by  [name], NP while in the presence of [name], NP.   I, [name], NP, have reviewed all documentation for this visit. The documentation on 07/19/21 for the exam, diagnosis, procedures, and orders are all accurate and complete.    Signed, [name], NP [place] [phone]

## 2021-07-19 NOTE — Patient Instructions (Addendum)
Instill Cortisporin drops to left ear 4 times daily for 3 days Use over the counter Debrox drops to left ear Follow-up as needed  Carbamide Peroxide Ear Solution What is this medication? CARBAMIDE PEROXIDE (CAR bah mide per OX ide) treats earwax buildup. It works by softening and loosening the earwax, which makes it easier to remove. This medicine may be used for other purposes; ask your health care provider or pharmacist if you have questions. COMMON BRAND NAME(S): Auro Ear, Auro Earache Relief, Clinere, Debrox, Ear Drops, Ear Wax Removal, Ear Wax Remover, Earwax Treatment, Murine, Thera-Ear What should I tell my care team before I take this medication? They need to know if you have any of these conditions: Dizziness Ear discharge Ear pain, irritation, or rash Infection Perforated eardrum (hole in eardrum) An unusual or allergic reaction to carbamide peroxide, glycerin, hydrogen peroxide, other medications, foods, dyes, or preservatives Pregnant or trying to get pregnant Breast-feeding How should I use this medication? This medication is only for use in the outer ear canal. Follow the directions carefully. Wash hands before and after use. The solution may be warmed by holding the bottle in the hand for 1 to 2 minutes. Lie with the affected ear facing upward. Place the proper number of drops into the ear canal. After the drops are instilled, remain lying with the affected ear upward for 5 minutes to help the drops stay in the ear canal. A cotton ball may be gently inserted at the ear opening for no longer than 5 to 10 minutes to ensure retention. Repeat, if necessary, for the opposite ear. Do not touch the tip of the dropper to the ear, fingertips, or other surface. Do not rinse the dropper after use. Keep container tightly closed. Talk to your care team about the use of this medication in children. While this medication may be used in children as young as 12 years for selected conditions,  precautions do apply. Overdosage: If you think you have taken too much of this medicine contact a poison control center or emergency room at once. NOTE: This medicine is only for you. Do not share this medicine with others. What if I miss a dose? If you miss a dose, use it as soon as you can. If it is almost time for your next dose, use only that dose. Do not use double or extra doses. What may interact with this medication? Interactions are not expected. Do not use any other ear products without asking your care team. This list may not describe all possible interactions. Give your health care provider a list of all the medicines, herbs, non-prescription drugs, or dietary supplements you use. Also tell them if you smoke, drink alcohol, or use illegal drugs. Some items may interact with your medicine. What should I watch for while using this medication? This medication is not for long-term use. Do not use for more than 4 days without checking with your care team. Contact your care team if your condition does not start to get better within a few days or if you notice burning, redness, itching, or swelling. What side effects may I notice from receiving this medication? Side effects that you should report to your care team as soon as possible: Allergic reactions--skin rash, itching, hives, swelling of the face, lips, tongue, or throat Worsening ear pain Side effects that usually do not require medical attention (report to your care team if they continue or are bothersome): Dizziness Itching or pain in the ear after use  This list may not describe all possible side effects. Call your doctor for medical advice about side effects. You may report side effects to FDA at 1-800-FDA-1088. Where should I keep my medication? Keep out of the reach of children and pets. Store at room temperature between 15 and 30 degrees C (59 and 86 degrees F) in a tight, light-resistant container. Keep bottle away from  excessive heat and direct sunlight. Throw away any unused medication after the expiration date. NOTE: This sheet is a summary. It may not cover all possible information. If you have questions about this medicine, talk to your doctor, pharmacist, or health care provider.  2023 Elsevier/Gold Standard (2021-01-25 00:00:00)  Otitis Media, Adult  Otitis media is a condition in which the middle ear is red and swollen (inflamed) and full of fluid. The middle ear is the part of the ear that contains bones for hearing as well as air that helps send sounds to the brain. The condition usually goes away on its own. What are the causes? This condition is caused by a blockage in the eustachian tube. This tube connects the middle ear to the back of the nose. It normally allows air into the middle ear. The blockage is caused by fluid or swelling. Problems that can cause blockage include: A cold or infection that affects the nose, mouth, or throat. Allergies. An irritant, such as tobacco smoke. Adenoids that have become large. The adenoids are soft tissue located in the back of the throat, behind the nose and the roof of the mouth. Growth or swelling in the upper part of the throat, just behind the nose (nasopharynx). Damage to the ear caused by a change in pressure. This is called barotrauma. What increases the risk? You are more likely to develop this condition if you: Smoke or are exposed to tobacco smoke. Have an opening in the roof of your mouth (cleft palate). Have acid reflux. Have problems in your body's defense system (immune system). What are the signs or symptoms? Symptoms of this condition include: Ear pain. Fever. Problems with hearing. Being tired. Fluid leaking from the ear. Ringing in the ear. How is this treated? This condition can go away on its own within 3-5 days. But if the condition is caused by germs (bacteria) and does not go away on its own, or if it keeps coming back, your  doctor may: Give you antibiotic medicines. Give you medicines for pain. Follow these instructions at home: Take over-the-counter and prescription medicines only as told by your doctor. If you were prescribed an antibiotic medicine, take it as told by your doctor. Do not stop taking it even if you start to feel better. Keep all follow-up visits. Contact a doctor if: You have bleeding from your nose. There is a lump on your neck. You are not feeling better in 5 days. You feel worse instead of better. Get help right away if: You have pain that is not helped with medicine. You have swelling, redness, or pain around your ear. You get a stiff neck. You cannot move part of your face (paralysis). You notice that the bone behind your ear hurts when you touch it. You get a very bad headache. Summary Otitis media means that the middle ear is red, swollen, and full of fluid. This condition usually goes away on its own. If the problem does not go away, treatment may be needed. You may be given medicines to treat the infection or to treat your pain. If you  were prescribed an antibiotic medicine, take it as told by your doctor. Do not stop taking it even if you start to feel better. Keep all follow-up visits. This information is not intended to replace advice given to you by your health care provider. Make sure you discuss any questions you have with your health care provider. Document Revised: 04/04/2020 Document Reviewed: 04/04/2020 Elsevier Patient Education  [employer].

## 2021-08-31 NOTE — Progress Notes (Signed)
Subjective:  Patient ID: Jerry [name], male    DOB: December 07, 1975  Age: 46 y.o. MRN: [id number]  Chief Complaint  Patient presents with   Hyperlipidemia    HPI   Pt presents for follow-up of hyperlipidemia and chronic allergic rhinitis. He has a history of lung cancer, last chest CT revealed nodules suspicious for metastatic lung cancer. He is scheduled for follow-up Ct next month. He is followed by Dr Julien Nordmann, oncology. Denies dyspnea or cp.  Lipid/Cholesterol, Follow-up  Last lipid panel Other pertinent labs  Lab Results  Component Value Date   CHOL 184 03/03/2021   HDL 38 (L) 03/03/2021   LDLCALC 126 (H) 03/03/2021   TRIG 108 03/03/2021   CHOLHDL 4.8 03/03/2021   Lab Results  Component Value Date   ALT 26 06/09/2021   AST 20 06/09/2021   PLT 239 06/09/2021   TSH 0.951 03/03/2021     He was last seen for this 6 months ago.  Management includes diet and exercise.  Current diet: in general, a "healthy" diet   Current exercise: walking  Current Outpatient Medications on File Prior to Visit  Medication Sig Dispense Refill   desonide (DESOWEN) 0.05 % cream Apply topically 2 (two) times daily. Apply to eyelid twice daily 30 g 0   loratadine (CLARITIN) 10 MG tablet Take 1 tablet (10 mg total) by mouth daily. 90 tablet 1   meloxicam (MOBIC) 15 MG tablet Take 1 tablet (15 mg total) by mouth daily. 30 tablet 1   neomycin-polymyxin-hydrocortisone (CORTISPORIN) OTIC solution Place 3 drops into the left ear 4 (four) times daily. 10 mL 0   triamcinolone cream (KENALOG) 0.1 % Apply 1 application. topically 2 (two) times daily. 30 g 0   Current Facility-Administered Medications on File Prior to Visit  Medication Dose Route Frequency Provider Last Rate Last Admin   0.9 %  sodium chloride infusion   Intravenous Continuous Curt Bears, MD       0.9 %  sodium chloride infusion   Intravenous Continuous Curt Bears, MD 900 mL/hr at 05/11/20 0940 New Bag at 05/11/20 0940    ondansetron (ZOFRAN) injection 8 mg  8 mg Intravenous Once Ardeen Garland, RN       Past Medical History:  Diagnosis Date   Aortic atherosclerosis ([place]) 08/05/2020   per chest CT   CAD (coronary artery disease), native coronary artery 08/05/2020   Left anterior descending CAD noted on chest CT   Cancer [facility])    Disorder due to vaping    Former smoker    Pneumonia    Past Surgical History:  Procedure Laterality Date   INTERCOSTAL NERVE BLOCK Right 03/28/2020   Procedure: INTERCOSTAL NERVE BLOCK;  Surgeon: [name], MD;  Location: [place];  Service: Thoracic;  Laterality: Right;   LUNG LOBECTOMY Right 03/30/2020   LYMPH NODE DISSECTION Right 03/28/2020   Procedure: LYMPH NODE DISSECTION;  Surgeon: [name], MD;  Location: [place];  Service: Thoracic;  Laterality: Right;   VIDEO BRONCHOSCOPY WITH ENDOBRONCHIAL NAVIGATION Right 03/02/2020   Procedure: VIDEO BRONCHOSCOPY WITH ENDOBRONCHIAL NAVIGATION;  Surgeon: Garner Nash, DO;  Location: [place];  Service: Pulmonary;  Laterality: Right;   VIDEO BRONCHOSCOPY WITH ENDOBRONCHIAL ULTRASOUND Bilateral 03/02/2020   Procedure: VIDEO BRONCHOSCOPY WITH ENDOBRONCHIAL ULTRASOUND;  Surgeon: Garner Nash, DO;  Location: [place];  Service: Pulmonary;  Laterality: Bilateral;   WISDOM TOOTH EXTRACTION      Family History  Problem Relation Age of Onset  Stroke Father    Hypertension Father    Hyperlipidemia Father    Arrhythmia Father    Lung cancer Paternal Aunt    Social History   Socioeconomic History   Marital status: Married    Spouse name: Not on file   Number of children: 6   Years of education: Not on file   Highest education level: Not on file  Occupational History   Occupation: Clinical biochemist  Tobacco Use   Smoking status: Former    Packs/day: 1.00    Years: 17.00    Total pack years: 17.00    Types: Cigarettes    Start date: 03/01/1991    Quit date: 02/29/2008    Years since quitting: 13.5   Smokeless  tobacco: Never  Vaping Use   Vaping Use: Former   Start date: 02/08/2013   Quit date: 02/29/2020   Substances: Nicotine  Substance and Sexual Activity   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   Drug use: Never   Sexual activity: Yes    Partners: Female  Other Topics Concern   Not on file  Social History Narrative   Not on file   Social Determinants of Health   Financial Resource Strain: Not on file  Food Insecurity: Not on file  Transportation Needs: Not on file  Physical Activity: Not on file  Stress: Not on file  Social Connections: Not on file    Review of Systems  Constitutional:  Negative for chills, diaphoresis, fatigue and fever.  HENT:  Negative for congestion, ear pain and sore throat.   Respiratory:  Negative for cough and shortness of breath.   Cardiovascular:  Negative for chest pain and leg swelling.  Gastrointestinal:  Negative for abdominal pain, constipation, diarrhea, nausea and vomiting.  Genitourinary:  Negative for dysuria and urgency.  Musculoskeletal:  Negative for arthralgias and myalgias.  Allergic/Immunologic: Positive for environmental allergies.  Neurological:  Negative for dizziness and headaches.  Psychiatric/Behavioral:  Negative for dysphoric mood.      Objective:  BP 132/72   Pulse 74   Temp 97.6 F (36.4 C)   Ht 5\' 11"  (1.803 m)   Wt (!) 326 lb (147.9 kg)   SpO2 96%   BMI 45.47 kg/m       07/19/2021    8:18 AM 07/03/2021    3:48 PM 06/20/2021    8:29 AM  BP/Weight  Systolic BP 774 128 786  Diastolic BP 72 72 85  Wt. (Lbs) 324 323 316.06  BMI 45.19 kg/m2 45.05 kg/m2 44.08 kg/m2    Physical Exam Vitals reviewed.  Constitutional:      Appearance: Normal appearance.  HENT:     Right Ear: Tympanic membrane normal.     Left Ear: Tympanic membrane normal.     Nose: Nose normal.     Mouth/Throat:     Mouth: Mucous membranes are moist.  Eyes:     Pupils: Pupils are equal, round, and reactive to light.   Cardiovascular:     Rate and Rhythm: Normal rate and regular rhythm.     Pulses: Normal pulses.     Heart sounds: Normal heart sounds.  Pulmonary:     Effort: Pulmonary effort is normal.     Breath sounds: Normal breath sounds.  Abdominal:     General: Bowel sounds are normal.     Palpations: Abdomen is soft.  Skin:    General: Skin is warm and dry.     Capillary Refill:  Capillary refill takes less than 2 seconds.  Neurological:     General: No focal deficit present.     Mental Status: He is alert and oriented to person, place, and time.  Psychiatric:        Mood and Affect: Mood normal.        Behavior: Behavior normal.         Lab Results  Component Value Date   WBC 10.1 06/09/2021   HGB 14.6 06/09/2021   HCT 42.4 06/09/2021   PLT 239 06/09/2021   GLUCOSE 100 (H) 06/09/2021   CHOL 184 03/03/2021   TRIG 108 03/03/2021   HDL 38 (L) 03/03/2021   LDLCALC 126 (H) 03/03/2021   ALT 26 06/09/2021   AST 20 06/09/2021   NA 142 06/09/2021   K 4.0 06/09/2021   CL 105 06/09/2021   CREATININE 1.09 06/09/2021   BUN 21 (H) 06/09/2021   CO2 28 06/09/2021   TSH 0.951 03/03/2021   INR 1.0 03/24/2020   HGBA1C 5.4 03/03/2021      Assessment & Plan:   1. Aortic atherosclerosis (HCC)-not at goal - CBC with Differential/Platelet - Comprehensive metabolic panel - Lipid panel -heart healthy diet -continue physical activity  2. History of lung cancer - CBC with Differential/Platelet - Comprehensive metabolic panel -follow-up with oncology as scheduled  3. Need for vaccination - Tdap vaccine greater than or equal to 7yo IM      We will call you with lab results TDAP given in office, repeat in 10 years Follow-up in 1 year, or sooner if needed   Follow-up: 1-year or sooner if needed  An After Visit Summary was printed and given to the patient.  I, [name], NP, have reviewed all documentation for this visit. The documentation on 09/01/21 for the exam,  diagnosis, procedures, and orders are all accurate and complete.   Signed, [name], NP [place] [phone]

## 2021-09-01 ENCOUNTER — Encounter: Payer: Self-pay | Admitting: Nurse Practitioner

## 2021-09-01 ENCOUNTER — Ambulatory Visit: Payer: BC Managed Care – PPO | Admitting: Nurse Practitioner

## 2021-09-01 VITALS — BP 132/72 | HR 74 | Temp 97.6°F | Ht 71.0 in | Wt 326.0 lb

## 2021-09-01 DIAGNOSIS — Z23 Encounter for immunization: Secondary | ICD-10-CM

## 2021-09-01 DIAGNOSIS — Z85118 Personal history of other malignant neoplasm of bronchus and lung: Secondary | ICD-10-CM | POA: Diagnosis not present

## 2021-09-01 DIAGNOSIS — I700 Atherosclerosis of aorta: Secondary | ICD-10-CM

## 2021-09-01 NOTE — Patient Instructions (Addendum)
We will call you with lab results TDAP given in office, repeat in 10 years Follow-up in 1 year, or sooner if needed   Preventing High Cholesterol Cholesterol is a white, waxy substance similar to fat that the human body needs to help build cells. The liver makes all the cholesterol that a person's body needs. Having high cholesterol (hypercholesterolemia) increases your risk for heart disease and stroke. Extra or excess cholesterol comes from the food that you eat. High cholesterol can often be prevented with diet and lifestyle changes. If you already have high cholesterol, you can control it with diet, lifestyle changes, and medicines. How can high cholesterol affect me? If you have high cholesterol, fatty deposits (plaques) may build up on the walls of your blood vessels. The blood vessels that carry blood away from your heart are called arteries. Plaques make the arteries narrower and stiffer. This in turn can: Restrict or block blood flow and cause blood clots to form. Increase your risk for heart attack and stroke. What can increase my risk for high cholesterol? This condition is more likely to develop in people who: Eat foods that are high in saturated fat or cholesterol. Saturated fat is mostly found in foods that come from animal sources. Are overweight. Are not getting enough exercise. Use products that contain nicotine or tobacco, such as cigarettes, e-cigarettes, and chewing tobacco. Have a family history of high cholesterol (familial hypercholesterolemia). What actions can I take to prevent this? Nutrition  Eat less saturated fat. Avoid trans fats (partially hydrogenated oils). These are often found in margarine and in some baked goods, fried foods, and snacks bought in packages. Avoid precooked or cured meat, such as bacon, sausages, or meat loaves. Avoid foods and drinks that have added sugars. Eat more fruits, vegetables, and whole grains. Choose healthy sources of protein,  such as fish, poultry, lean cuts of red meat, beans, peas, lentils, and nuts. Choose healthy sources of fat, such as: Nuts. Vegetable oils, especially olive oil. Fish that have healthy fats, such as omega-3 fatty acids. These fish include mackerel or salmon. Lifestyle Lose weight if you are overweight. Maintaining a healthy body mass index (BMI) can help prevent or control high cholesterol. It can also lower your risk for diabetes and high blood pressure. Ask your health care provider to help you with a diet and exercise plan to lose weight safely. Do not use any products that contain nicotine or tobacco. These products include cigarettes, chewing tobacco, and vaping devices, such as e-cigarettes. If you need help quitting, ask your health care provider. Alcohol use Do not drink alcohol if: Your health care provider tells you not to drink. You are pregnant, may be pregnant, or are planning to become pregnant. If you drink alcohol: Limit how much you have to: 0-1 drink a day for women. 0-2 drinks a day for men. Know how much alcohol is in your drink. In the [place] one drink equals one 12 oz bottle of beer (355 mL), one 5 oz glass of wine (148 mL), or one 1 oz glass of hard liquor (44 mL). Activity  Get enough exercise. Do exercises as told by your health care provider. Each week, do at least 150 minutes of exercise that takes a medium level of effort (moderate-intensity exercise). This kind of exercise: Makes your heart beat faster while allowing you to still be able to talk. Can be done in short sessions several times a day or longer sessions a few times a week. For  example, on 5 days each week, you could walk fast or ride your bike 3 times a day for 10 minutes each time. Medicines Your health care provider may recommend medicines to help lower cholesterol. This may be a medicine to lower the amount of cholesterol that your liver makes. You may need medicine if: Diet and lifestyle changes  have not lowered your cholesterol enough. You have high cholesterol and other risk factors for heart disease or stroke. Take over-the-counter and prescription medicines only as told by your health care provider. General information Manage your risk factors for high cholesterol. Talk with your health care provider about all your risk factors and how to lower your risk. Manage other conditions that you have, such as diabetes or high blood pressure (hypertension). Have blood tests to check your cholesterol levels at regular points in time as told by your health care provider. Keep all follow-up visits. This is important. Where to find more information American Heart Association: www.heart.org National Heart, Lung, and Blood Institute: https://wilson-eaton.com/ Summary High cholesterol increases your risk for heart disease and stroke. By keeping your cholesterol level low, you can reduce your risk for these conditions. High cholesterol can often be prevented with diet and lifestyle changes. Work with your health care provider to manage your risk factors, and have your blood tested regularly. This information is not intended to replace advice given to you by your health care provider. Make sure you discuss any questions you have with your health care provider. Document Revised: 02/29/2020 Document Reviewed: 02/29/2020 Elsevier Patient Education  [place].

## 2021-09-02 LAB — COMPREHENSIVE METABOLIC PANEL
ALT: 36 IU/L (ref 0–44)
AST: 26 IU/L (ref 0–40)
Albumin/Globulin Ratio: 2 (ref 1.2–2.2)
Albumin: 4.5 g/dL (ref 4.1–5.1)
Alkaline Phosphatase: 64 IU/L (ref 44–121)
BUN/Creatinine Ratio: 19 (ref 9–20)
BUN: 20 mg/dL (ref 6–24)
Bilirubin Total: 0.4 mg/dL (ref 0.0–1.2)
CO2: 21 mmol/L (ref 20–29)
Calcium: 9.2 mg/dL (ref 8.7–10.2)
Chloride: 104 mmol/L (ref 96–106)
Creatinine, Ser: 1.07 mg/dL (ref 0.76–1.27)
Globulin, Total: 2.3 g/dL (ref 1.5–4.5)
Glucose: 117 mg/dL — ABNORMAL HIGH (ref 70–99)
Potassium: 4.2 mmol/L (ref 3.5–5.2)
Sodium: 142 mmol/L (ref 134–144)
Total Protein: 6.8 g/dL (ref 6.0–8.5)
eGFR: 88 mL/min/{1.73_m2} (ref >59)

## 2021-09-02 LAB — CBC WITH DIFFERENTIAL/PLATELET
Basophils Absolute: 0 10*3/uL (ref 0.0–0.2)
Basos: 1 %
EOS (ABSOLUTE): 0 10*3/uL (ref 0.0–0.4)
Eos: 1 %
Hematocrit: 44.3 % (ref 37.5–51.0)
Hemoglobin: 14.7 g/dL (ref 13.0–17.7)
Immature Grans (Abs): 0 10*3/uL (ref 0.0–0.1)
Immature Granulocytes: 1 %
Lymphocytes Absolute: 1.3 10*3/uL (ref 0.7–3.1)
Lymphs: 22 %
MCH: 30.5 pg (ref 26.6–33.0)
MCHC: 33.2 g/dL (ref 31.5–35.7)
MCV: 92 fL (ref 79–97)
Monocytes Absolute: 0.5 10*3/uL (ref 0.1–0.9)
Monocytes: 9 %
Neutrophils Absolute: 3.9 10*3/uL (ref 1.4–7.0)
Neutrophils: 66 %
Platelets: 265 10*3/uL (ref 150–450)
RBC: 4.82 x10E6/uL (ref 4.14–5.80)
RDW: 12.4 % (ref 11.6–15.4)
WBC: 5.8 10*3/uL (ref 3.4–10.8)

## 2021-09-02 LAB — LIPID PANEL
Chol/HDL Ratio: 5.6 ratio — ABNORMAL HIGH (ref 0.0–5.0)
Cholesterol, Total: 230 mg/dL — ABNORMAL HIGH (ref 100–199)
HDL: 41 mg/dL (ref >39)
LDL Chol Calc (NIH): 155 mg/dL — ABNORMAL HIGH (ref 0–99)
Triglycerides: 186 mg/dL — ABNORMAL HIGH (ref 0–149)
VLDL Cholesterol Cal: 34 mg/dL (ref 5–40)

## 2021-09-02 LAB — CARDIOVASCULAR RISK ASSESSMENT

## 2021-09-06 LAB — SPECIMEN STATUS REPORT

## 2021-09-06 LAB — HGB A1C W/O EAG: Hgb A1c MFr Bld: 5.8 % — ABNORMAL HIGH (ref 4.8–5.6)

## 2021-09-07 ENCOUNTER — Other Ambulatory Visit: Payer: Self-pay

## 2021-09-07 MED ORDER — ROSUVASTATIN CALCIUM 5 MG PO TABS: 5.0000 mg | ORAL_TABLET | Freq: Every day | ORAL | 2 refills | Status: DC

## 2021-09-18 ENCOUNTER — Telehealth: Payer: Self-pay | Admitting: Internal Medicine

## 2021-09-18 ENCOUNTER — Inpatient Hospital Stay: Payer: BC Managed Care – PPO

## 2021-09-18 NOTE — Telephone Encounter (Signed)
Called patient regarding upcoming September appointment, patient is notified.  

## 2021-09-19 ENCOUNTER — Ambulatory Visit (HOSPITAL_COMMUNITY)
Admission: RE | Admit: 2021-09-19 | Discharge: 2021-09-19 | Disposition: A | Discharge status: Home / Self Care | Payer: BC Managed Care – PPO | Source: Ambulatory Visit | Attending: Internal Medicine | Admitting: Internal Medicine

## 2021-09-19 ENCOUNTER — Other Ambulatory Visit: Payer: Self-pay

## 2021-09-19 ENCOUNTER — Inpatient Hospital Stay: Payer: BC Managed Care – PPO | Attending: Internal Medicine

## 2021-09-19 DIAGNOSIS — Z902 Acquired absence of lung [part of]: Secondary | ICD-10-CM | POA: Insufficient documentation

## 2021-09-19 DIAGNOSIS — C3490 Malignant neoplasm of unspecified part of unspecified bronchus or lung: Secondary | ICD-10-CM

## 2021-09-19 DIAGNOSIS — Z85118 Personal history of other malignant neoplasm of bronchus and lung: Secondary | ICD-10-CM | POA: Insufficient documentation

## 2021-09-19 DIAGNOSIS — Z9221 Personal history of antineoplastic chemotherapy: Secondary | ICD-10-CM | POA: Insufficient documentation

## 2021-09-19 LAB — CBC WITH DIFFERENTIAL (CANCER CENTER ONLY)
Abs Immature Granulocytes: 0.06 10*3/uL (ref 0.00–0.07)
Basophils Absolute: 0 10*3/uL (ref 0.0–0.1)
Basophils Relative: 1 %
Eosinophils Absolute: 0.1 10*3/uL (ref 0.0–0.5)
Eosinophils Relative: 1 %
HCT: 44.1 % (ref 39.0–52.0)
Hemoglobin: 15.1 g/dL (ref 13.0–17.0)
Immature Granulocytes: 1 %
Lymphocytes Relative: 21 %
Lymphs Abs: 1.3 10*3/uL (ref 0.7–4.0)
MCH: 31.3 pg (ref 26.0–34.0)
MCHC: 34.2 g/dL (ref 30.0–36.0)
MCV: 91.5 fL (ref 80.0–100.0)
Monocytes Absolute: 0.5 10*3/uL (ref 0.1–1.0)
Monocytes Relative: 9 %
Neutro Abs: 3.9 10*3/uL (ref 1.7–7.7)
Neutrophils Relative %: 67 %
Platelet Count: 227 10*3/uL (ref 150–400)
RBC: 4.82 MIL/uL (ref 4.22–5.81)
RDW: 12.6 % (ref 11.5–15.5)
WBC Count: 5.9 10*3/uL (ref 4.0–10.5)
nRBC: 0 % (ref 0.0–0.2)

## 2021-09-19 LAB — CMP (CANCER CENTER ONLY)
ALT: 38 U/L (ref 0–44)
AST: 28 U/L (ref 15–41)
Albumin: 4.5 g/dL (ref 3.5–5.0)
Alkaline Phosphatase: 43 U/L (ref 38–126)
Anion gap: 7 (ref 5–15)
BUN: 18 mg/dL (ref 6–20)
CO2: 27 mmol/L (ref 22–32)
Calcium: 9.8 mg/dL (ref 8.9–10.3)
Chloride: 107 mmol/L (ref 98–111)
Creatinine: 0.89 mg/dL (ref 0.61–1.24)
GFR, Estimated: >60 mL/min (ref >60)
Glucose, Bld: 127 mg/dL — ABNORMAL HIGH (ref 70–99)
Potassium: 4.5 mmol/L (ref 3.5–5.1)
Sodium: 141 mmol/L (ref 135–145)
Total Bilirubin: 0.5 mg/dL (ref 0.3–1.2)
Total Protein: 7.1 g/dL (ref 6.5–8.1)

## 2021-09-19 MED ORDER — IOHEXOL 300 MG/ML  SOLN
75.0000 mL | Freq: Once | INTRAMUSCULAR | Status: AC | PRN
  Administered 2021-09-19: 75 mL via INTRAVENOUS

## 2021-09-19 MED ORDER — SODIUM CHLORIDE (PF) 0.9 % IJ SOLN
INTRAMUSCULAR | Status: AC
  Filled 2021-09-19: qty 50

## 2021-09-20 ENCOUNTER — Other Ambulatory Visit: Payer: Self-pay

## 2021-09-20 ENCOUNTER — Inpatient Hospital Stay: Payer: BC Managed Care – PPO | Admitting: Internal Medicine

## 2021-09-20 VITALS — BP 150/76 | HR 76 | Temp 98.2°F | Resp 17 | Wt 333.3 lb

## 2021-09-20 DIAGNOSIS — C3490 Malignant neoplasm of unspecified part of unspecified bronchus or lung: Secondary | ICD-10-CM | POA: Diagnosis not present

## 2021-09-20 DIAGNOSIS — Z9221 Personal history of antineoplastic chemotherapy: Secondary | ICD-10-CM | POA: Diagnosis not present

## 2021-09-20 DIAGNOSIS — Z85118 Personal history of other malignant neoplasm of bronchus and lung: Secondary | ICD-10-CM | POA: Diagnosis not present

## 2021-09-20 DIAGNOSIS — Z902 Acquired absence of lung [part of]: Secondary | ICD-10-CM | POA: Diagnosis not present

## 2021-09-20 NOTE — Progress Notes (Signed)
[place] [phone]   [phone]  OFFICE PROGRESS NOTE  [name], NP [address] [place] [address]  DIAGNOSIS: Stage IIA (T2b, N0, M0) non-small cell lung cancer, adenocarcinoma presented with right upper lobe lung mass diagnosed in February 2022.  Biomarker Findings Microsatellite status - MS-Stable Tumor Mutational Burden - 4 Muts/Mb Genomic Findings For a complete list of the genes assayed, please refer to the Appendix. KRAS G12D KEAP1 P278S 7 Disease relevant genes with no reportable alterations: ALK, BRAF, EGFR, ERBB2, MET, RET, ROS1  PDL1 Expression 5%.  PRIOR THERAPY:  1) Status post robotic assisted right video thoracoscopy with right upper lobectomy and mediastinal lymph node sampling under the care of Dr. Kipp Brood on March 28, 2020. 2) Adjuvant systemic chemotherapy with cisplatin 75 Mg/M2 and Alimta 500 Mg/M2 every 3 weeks.  First dose May 05, 2020.  Status post 4 cycles.  Last dose was giving July 07, 2020.  Cisplatin was discontinued after cycle #2 secondary to uncontrolled nausea and vomiting and it was replaced with carboplatin for the last 2 cycles.  CURRENT THERAPY: Observation.  INTERVAL HISTORY: Jerry [name] 46 y.o. male returns to the clinic today for follow-up visit.  The patient is feeling fine today with no concerning complaints.  He gained few pounds since his last visit.  He denied having any chest pain but has shortness of breath with exertion with no cough or hemoptysis.  He has no nausea, vomiting, diarrhea or constipation.  He has no headache or visual changes.  He had repeat CT scan of the chest performed recently and he is here for evaluation and discussion of his scan results.   MEDICAL HISTORY: Past Medical History:  Diagnosis Date   Aortic atherosclerosis ([place]) 08/05/2020   per chest CT   CAD (coronary artery disease), native coronary artery 08/05/2020   Left anterior  descending CAD noted on chest CT   Cancer [facility])    Disorder due to vaping    Former smoker    Pneumonia     ALLERGIES:  is allergic to olanzapine.  MEDICATIONS:  Current Outpatient Medications  Medication Sig Dispense Refill   desonide (DESOWEN) 0.05 % cream Apply topically 2 (two) times daily. Apply to eyelid twice daily 30 g 0   loratadine (CLARITIN) 10 MG tablet Take 1 tablet (10 mg total) by mouth daily. 90 tablet 1   rosuvastatin (CRESTOR) 5 MG tablet Take 1 tablet (5 mg total) by mouth daily. 30 tablet 2   triamcinolone cream (KENALOG) 0.1 % Apply 1 application. topically 2 (two) times daily. 30 g 0   No current facility-administered medications for this visit.   Facility-Administered Medications Ordered in Other Visits  Medication Dose Route Frequency Provider Last Rate Last Admin   0.9 %  sodium chloride infusion   Intravenous Continuous Curt Bears, MD       0.9 %  sodium chloride infusion   Intravenous Continuous Curt Bears, MD 900 mL/hr at 05/11/20 0940 New Bag at 05/11/20 0940   ondansetron (ZOFRAN) injection 8 mg  8 mg Intravenous Once Ardeen Garland, RN        SURGICAL HISTORY:  Past Surgical History:  Procedure Laterality Date   INTERCOSTAL NERVE BLOCK Right 03/28/2020   Procedure: INTERCOSTAL NERVE BLOCK;  Surgeon: [name], MD;  Location: [facility];  Service: Thoracic;  Laterality: Right;   LUNG LOBECTOMY Right 03/30/2020   LYMPH NODE DISSECTION Right 03/28/2020  Procedure: LYMPH NODE DISSECTION;  Surgeon: [name], MD;  Location: [facility] [place];  Service: Thoracic;  Laterality: Right;   VIDEO BRONCHOSCOPY WITH ENDOBRONCHIAL NAVIGATION Right 03/02/2020   Procedure: VIDEO BRONCHOSCOPY WITH ENDOBRONCHIAL NAVIGATION;  Surgeon: Garner Nash, DO;  Location: [facility];  Service: Pulmonary;  Laterality: Right;   VIDEO BRONCHOSCOPY WITH ENDOBRONCHIAL ULTRASOUND Bilateral 03/02/2020   Procedure: VIDEO BRONCHOSCOPY WITH ENDOBRONCHIAL ULTRASOUND;  Surgeon:  Garner Nash, DO;  Location: [facility];  Service: Pulmonary;  Laterality: Bilateral;   WISDOM TOOTH EXTRACTION      REVIEW OF SYSTEMS:  A comprehensive review of systems was negative except for: Respiratory: positive for dyspnea on exertion   PHYSICAL EXAMINATION: General appearance: alert, cooperative, and no distress Head: Normocephalic, without obvious abnormality, atraumatic Neck: no adenopathy, no JVD, supple, symmetrical, trachea midline, and thyroid not enlarged, symmetric, no tenderness/mass/nodules Lymph nodes: Cervical, supraclavicular, and axillary nodes normal. Resp: clear to auscultation bilaterally Back: symmetric, no curvature. ROM normal. No CVA tenderness. Cardio: regular rate and rhythm, S1, S2 normal, no murmur, click, rub or gallop GI: soft, non-tender; bowel sounds normal; no masses,  no organomegaly Extremities: extremities normal, atraumatic, no cyanosis or edema  ECOG PERFORMANCE STATUS: 1 - Symptomatic but completely ambulatory  Blood pressure (!) 150/76, pulse 76, temperature 98.2 F (36.8 C), temperature source Oral, resp. rate 17, weight (!) 333 lb 5 oz (151.2 kg), SpO2 96 %.  LABORATORY DATA: Lab Results  Component Value Date   WBC 5.9 09/19/2021   HGB 15.1 09/19/2021   HCT 44.1 09/19/2021   MCV 91.5 09/19/2021   PLT 227 09/19/2021      Chemistry      Component Value Date/Time   NA 141 09/19/2021 0821   NA 142 09/01/2021 0756   K 4.5 09/19/2021 0821   CL 107 09/19/2021 0821   CO2 27 09/19/2021 0821   BUN 18 09/19/2021 0821   BUN 20 09/01/2021 0756   CREATININE 0.89 09/19/2021 0821      Component Value Date/Time   CALCIUM 9.8 09/19/2021 0821   ALKPHOS 43 09/19/2021 0821   AST 28 09/19/2021 0821   ALT 38 09/19/2021 0821   BILITOT 0.5 09/19/2021 0821       RADIOGRAPHIC STUDIES: CT Chest W Contrast  Result Date: 09/19/2021 CLINICAL DATA:  Non-small cell lung cancer restaging * Tracking Code: BO * EXAM: CT CHEST WITH CONTRAST TECHNIQUE:  Multidetector CT imaging of the chest was performed during intravenous contrast administration. RADIATION DOSE REDUCTION: This exam was performed according to the departmental dose-optimization program which includes automated exposure control, adjustment of the mA and/or kV according to patient size and/or use of iterative reconstruction technique. CONTRAST:  76m OMNIPAQUE IOHEXOL 300 MG/ML  SOLN COMPARISON:  06/09/2021 FINDINGS: Cardiovascular: Left anterior descending coronary artery atherosclerotic calcification. Mediastinum/Nodes: Unremarkable Lungs/Pleura: Right upper lobectomy. Previous nodules and ground-glass densities in the right lower lobe and right middle lobe have resolved, compatible with inflammatory/infectious process. No current findings suspicious for active malignancy. Upper Abdomen: Suspected hepatic steatosis with some spurring along the gallbladder fossa. Musculoskeletal: Mid to lower thoracic spondylosis as on image 121 series 7. No bony impingement identified. IMPRESSION: 1. Previous nodules and ground-glass densities in the right lung have resolved, compatible with inflammatory/infectious process. No current findings suspicious for active malignancy. Prior right upper lobectomy. 2. Hepatic steatosis. 3. Left anterior descending coronary artery atherosclerosis-advanced for age. Cardiologist referral may be appropriate if the patient does not already see a cardiologist. Electronically Signed   By:  Van Clines M.D.   On: 09/19/2021 17:11     ASSESSMENT AND PLAN: This is a very pleasant 46 years old white male recently diagnosed with stage IIA (T2b, N0, M0) non-small cell lung cancer, adenocarcinoma status post right upper lobectomy with lymph node sampling under the care of Dr. Kipp Brood on March 28, 2020. The patient had molecular studies by foundation 1 that showed no actionable mutation and his PD-L1 expression was 5%. The patient underwent adjuvant systemic chemotherapy with  cisplatin 75 Mg/M2 and Alimta 500 Mg/M2.  Status post 4 cycles.  Cisplatin was replaced with carboplatin starting from cycle #3.  The patient tolerated his treatment well except for significant nausea and vomiting during the cisplatin treatment. The patient is currently on observation and he is feeling fine with no concerning complaints. He had repeat CT scan of the chest performed recently.  I personally and independently reviewed the scan and discussed the results with the patient today. His scan showed no concerning findings for disease recurrence or metastasis. I recommended for him to continue on observation with repeat CT scan of the chest in 6 months. The patient was advised to call immediately if he has any other concerning symptoms in the interval. The patient voices understanding of current disease status and treatment options and is in agreement with the current care plan. The total time spent in the appointment was 30 minutes.  All questions were answered. The patient knows to call the clinic with any problems, questions or concerns. We can certainly see the patient much sooner if necessary.   Disclaimer: This note was dictated with voice recognition software. Similar sounding words can inadvertently be transcribed and may not be corrected upon review.

## 2021-10-13 DIAGNOSIS — R21 Rash and other nonspecific skin eruption: Secondary | ICD-10-CM | POA: Diagnosis not present

## 2021-10-13 DIAGNOSIS — L259 Unspecified contact dermatitis, unspecified cause: Secondary | ICD-10-CM | POA: Diagnosis not present

## 2022-02-05 ENCOUNTER — Ambulatory Visit: Payer: BC Managed Care – PPO | Admitting: Nurse Practitioner

## 2022-02-05 ENCOUNTER — Encounter: Payer: Self-pay | Admitting: Nurse Practitioner

## 2022-02-05 VITALS — BP 118/64 | HR 80 | Temp 97.1°F | Resp 18 | Ht 71.0 in | Wt 316.0 lb

## 2022-02-05 DIAGNOSIS — H6502 Acute serous otitis media, left ear: Secondary | ICD-10-CM | POA: Diagnosis not present

## 2022-02-05 DIAGNOSIS — L2089 Other atopic dermatitis: Secondary | ICD-10-CM

## 2022-02-05 DIAGNOSIS — R0683 Snoring: Secondary | ICD-10-CM | POA: Diagnosis not present

## 2022-02-05 DIAGNOSIS — H6690 Otitis media, unspecified, unspecified ear: Secondary | ICD-10-CM | POA: Insufficient documentation

## 2022-02-05 MED ORDER — AMOXICILLIN-POT CLAVULANATE 875-125 MG PO TABS
1.0000 | ORAL_TABLET | Freq: Two times a day (BID) | ORAL | 0 refills | Status: DC
Start: 2022-02-05 — End: 2022-03-21

## 2022-02-05 MED ORDER — LORATADINE 10 MG PO TABS: 10.0000 mg | ORAL_TABLET | Freq: Every day | ORAL | 1 refills | Status: DC

## 2022-02-05 NOTE — Assessment & Plan Note (Signed)
Claritin 10 mg everyday  Try to be away from triggering agents

## 2022-02-05 NOTE — Patient Instructions (Signed)
Debrox OTC for Wax removal   Otitis Media, Adult  Otitis media occurs when there is inflammation and fluid in the middle ear with signs and symptoms of an acute infection. The middle ear is a part of the ear that contains bones for hearing as well as air that helps send sounds to the brain. When infected fluid builds up in this space, it causes pressure and can lead to an ear infection. The eustachian tube connects the middle ear to the back of the nose (nasopharynx) and normally allows air into the middle ear. If the eustachian tube becomes blocked, fluid can build up and become infected. What are the causes? This condition is caused by a blockage in the eustachian tube. This can be caused by mucus or by swelling of the tube. Problems that can cause a blockage include: A cold or other upper respiratory infection. Allergies. An irritant, such as tobacco smoke. Enlarged adenoids. The adenoids are areas of soft tissue located high in the back of the throat, behind the nose and the roof of the mouth. They are part of the body's defense system (immune system). A mass in the nasopharynx. Damage to the ear caused by pressure changes (barotrauma). What increases the risk? You are more likely to develop this condition if you: Smoke or are exposed to tobacco smoke. Have an opening in the roof of your mouth (cleft palate). Have gastroesophageal reflux. Have an immune system disorder. What are the signs or symptoms? Symptoms of this condition include: Ear pain. Fever. Decreased hearing. Tiredness (lethargy). Fluid leaking from the ear, if the eardrum is ruptured or has burst. Ringing in the ear. How is this diagnosed?  This condition is diagnosed with a physical exam. During the exam, your health care provider will use an instrument called an otoscope to look in your ear and check for redness, swelling, and fluid. He or she will also ask about your symptoms. Your health care provider may also  order tests, such as: A pneumatic otoscopy. This is a test to check the movement of the eardrum. It is done by squeezing a small amount of air into the ear. A tympanogram. This is a test that shows how well the eardrum moves in response to air pressure in the ear canal. It provides a graph for your health care provider to review. How is this treated? This condition can go away on its own within 3-5 days. But if the condition is caused by a bacterial infection and does not go away on its own, or if it keeps coming back, your health care provider may: Prescribe antibiotic medicine to treat the infection. Prescribe or recommend medicines to control pain. Follow these instructions at home: Take over-the-counter and prescription medicines only as told by your health care provider. If you were prescribed an antibiotic medicine, take it as told by your health care provider. Do not stop taking the antibiotic even if you start to feel better. Keep all follow-up visits. This is important. Contact a health care provider if: You have bleeding from your nose. There is a lump on your neck. You are not feeling better in 5 days. You feel worse instead of better. Get help right away if: You have severe pain that is not controlled with medicine. You have swelling, redness, or pain around your ear. You have stiffness in your neck. A part of your face is not moving (paralyzed). The bone behind your ear (mastoid bone) is tender when you touch it. You  develop a severe headache. Summary Otitis media is redness, soreness, and swelling of the middle ear, usually resulting in pain and decreased hearing. This condition can go away on its own within 3-5 days. If the problem does not go away in 3-5 days, your health care provider may give you medicines to treat the infection. If you were prescribed an antibiotic medicine, take it as told by your health care provider. Follow all instructions that were given to you by  your health care provider. This information is not intended to replace advice given to you by your health care provider. Make sure you discuss any questions you have with your health care provider. Document Revised: 04/04/2020 Document Reviewed: 04/04/2020 Elsevier Patient Education  [place].

## 2022-02-05 NOTE — Assessment & Plan Note (Signed)
Started Augmentin for 10 days  Follow up sooner if  You have severe pain that is not controlled with medicine. You have swelling, redness, or pain around your ear. You have stiffness in your neck. A part of your face is not moving (paralyzed). The bone behind your ear (mastoid bone) is tender when you touch it. You develop a severe headache.

## 2022-02-05 NOTE — Progress Notes (Signed)
Acute Office Visit  Subjective:    Patient ID: Jerry [name], male    DOB: October 31, 1975, 47 y.o.   MRN: [id number]  Chief Complaint  Patient presents with   Otalgia    Left     HPI: Patient is in today for left earache which started several days ago. According to him it is painful and draining . Ear feels like full and pressured.  Some fluid coming out of the ears. Denies fever, decreased hearing, lethargy and ringing in the ear. He is not using anything OTC for this.    He also has been told by his friend that he stops breathing at night. He is snoring loud and it has been on and off since past year. He wants to discuss a sleep study.  Past Medical History:  Diagnosis Date   Aortic atherosclerosis ([place]) 08/05/2020   per chest CT   CAD (coronary artery disease), native coronary artery 08/05/2020   Left anterior descending CAD noted on chest CT   Cancer [facility])    Disorder due to vaping    Former smoker    Otitis media 07/03/2021   Pneumonia     Past Surgical History:  Procedure Laterality Date   INTERCOSTAL NERVE BLOCK Right 03/28/2020   Procedure: INTERCOSTAL NERVE BLOCK;  Surgeon: [name], MD;  Location: [place];  Service: Thoracic;  Laterality: Right;   LUNG LOBECTOMY Right 03/30/2020   LYMPH NODE DISSECTION Right 03/28/2020   Procedure: LYMPH NODE DISSECTION;  Surgeon: [name], MD;  Location: [place];  Service: Thoracic;  Laterality: Right;   [place] Right 03/02/2020   Procedure: VIDEO BRONCHOSCOPY WITH ENDOBRONCHIAL NAVIGATION;  Surgeon: Garner Nash, DO;  Location: [place];  Service: Pulmonary;  Laterality: Right;   VIDEO BRONCHOSCOPY WITH ENDOBRONCHIAL ULTRASOUND Bilateral 03/02/2020   Procedure: VIDEO BRONCHOSCOPY WITH ENDOBRONCHIAL ULTRASOUND;  Surgeon: Garner Nash, DO;  Location: [place];  Service: Pulmonary;  Laterality: Bilateral;   WISDOM TOOTH EXTRACTION      Family History  Problem Relation Age of  Onset   Stroke Father    Hypertension Father    Hyperlipidemia Father    Arrhythmia Father    Lung cancer Paternal Aunt     Social History   Socioeconomic History   Marital status: Married    Spouse name: Not on file   Number of children: 6   Years of education: Not on file   Highest education level: Not on file  Occupational History   Occupation: Clinical biochemist  Tobacco Use   Smoking status: Former    Packs/day: 1.00    Years: 17.00    Total pack years: 17.00    Types: Cigarettes    Start date: 03/01/1991    Quit date: 02/29/2008    Years since quitting: 13.9   Smokeless tobacco: Never  Vaping Use   Vaping Use: Former   Start date: 02/08/2013   Quit date: 02/29/2020   Substances: Nicotine  Substance and Sexual Activity   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   Drug use: Never   Sexual activity: Yes    Partners: Female  Other Topics Concern   Not on file  Social History Narrative   Not on file   Social Determinants of Health   Financial Resource Strain: Low Risk  (09/01/2021)   Overall Financial Resource Strain (CARDIA)    Difficulty of Paying Living Expenses: Not hard at all  Food Insecurity:  No Food Insecurity (09/01/2021)   Hunger Vital Sign    Worried About Running Out of Food in the Last Year: Never true    Ran Out of Food in the Last Year: Never true  Transportation Needs: No Transportation Needs (09/01/2021)   PRAPARE - Hydrologist (Medical): No    Lack of Transportation (Non-Medical): No  Physical Activity: Inactive (09/01/2021)   Exercise Vital Sign    Days of Exercise per Week: 0 days    Minutes of Exercise per Session: 0 min  Stress: No Stress Concern Present (09/01/2021)   [place]    Feeling of Stress : Not at all  Social Connections: Moderately Integrated (09/01/2021)   Social Connection and Isolation Panel [NHANES]    Frequency of  Communication with Friends and Family: More than three times a week    Frequency of Social Gatherings with Friends and Family: More than three times a week    Attends Religious Services: More than 4 times per year    Active Member of [employer] or Organizations: No    Attends Archivist Meetings: Never    Marital Status: Married  Human resources officer Violence: Not At Risk (09/01/2021)   Humiliation, Afraid, Rape, and Kick questionnaire    Fear of Current or Ex-Partner: No    Emotionally Abused: No    Physically Abused: No    Sexually Abused: No    Outpatient Medications Prior to Visit  Medication Sig Dispense Refill   desonide (DESOWEN) 0.05 % cream Apply topically 2 (two) times daily. Apply to eyelid twice daily 30 g 0   triamcinolone cream (KENALOG) 0.1 % Apply 1 application. topically 2 (two) times daily. 30 g 0   loratadine (CLARITIN) 10 MG tablet Take 1 tablet (10 mg total) by mouth daily. 90 tablet 1   rosuvastatin (CRESTOR) 5 MG tablet Take 1 tablet (5 mg total) by mouth daily. (Patient not taking: Reported on 02/05/2022) 30 tablet 2   Facility-Administered Medications Prior to Visit  Medication Dose Route Frequency Provider Last Rate Last Admin   0.9 %  sodium chloride infusion   Intravenous Continuous Curt Bears, MD       0.9 %  sodium chloride infusion   Intravenous Continuous Curt Bears, MD 900 mL/hr at 05/11/20 0940 New Bag at 05/11/20 0940   ondansetron (ZOFRAN) injection 8 mg  8 mg Intravenous Once Bell, Ellard Artis, RN        Allergies  Allergen Reactions   Olanzapine Other (See Comments)    "Nightmares, night sweats"    Review of Systems  Constitutional:  Negative for chills and fever.  HENT:  Positive for ear discharge (left ear pain and discharge) and ear pain (left). Negative for congestion, dental problem, drooling, facial swelling, hearing loss, mouth sores, postnasal drip, rhinorrhea, sinus pressure, sinus pain and sneezing.   Respiratory:  Negative  for cough and shortness of breath.        Sleep apnea   Cardiovascular:  Negative for chest pain.  Gastrointestinal:  Abdominal pain: occasional left upper abdominal cramping..  Musculoskeletal:  Negative for back pain.  Neurological:  Negative for headaches.  Psychiatric/Behavioral:  Negative for dysphoric mood.        Objective:    BP 118/64   Pulse 80   Temp (!) 97.1 F (36.2 C)   Resp 18   Ht 5\' 11"  (1.803 m)   Wt (!) 316 lb (143.3  kg)   BMI 44.07 kg/m      02/05/2022    2:44 PM 09/20/2021    1:48 PM 09/01/2021    7:37 AM  Vitals with BMI  Height 5\' 11"   5\' 11"   Weight 316 lbs 333 lbs 5 oz 326 lbs  BMI 44.09 99.83 38.25  Systolic 053 976 734  Diastolic 64 76 72  Pulse 80 76 74    Physical Exam Constitutional:      Appearance: He is obese.  HENT:     Head: Normocephalic.     Right Ear: Tympanic membrane normal. There is impacted cerumen. Tympanic membrane is not erythematous.     Left Ear: Drainage, swelling and tenderness present. A middle ear effusion is present. There is impacted cerumen. Tympanic membrane is erythematous. Tympanic membrane is not bulging.  Cardiovascular:     Rate and Rhythm: Normal rate and regular rhythm.     Pulses: Normal pulses.     Heart sounds: Normal heart sounds.  Pulmonary:     Effort: No respiratory distress.     Breath sounds: No wheezing.  Abdominal:     Palpations: Abdomen is soft.  Musculoskeletal:        General: Normal range of motion.  Skin:    General: Skin is warm.  Psychiatric:        Mood and Affect: Mood normal.        Behavior: Behavior normal.        Thought Content: Thought content normal.        Judgment: Judgment normal.     Lab Results  Component Value Date   TSH 0.951 03/03/2021   Lab Results  Component Value Date   WBC 5.9 09/19/2021   HGB 15.1 09/19/2021   HCT 44.1 09/19/2021   MCV 91.5 09/19/2021   PLT 227 09/19/2021   Lab Results  Component Value Date   NA 141 09/19/2021   K 4.5  09/19/2021   CO2 27 09/19/2021   GLUCOSE 127 (H) 09/19/2021   BUN 18 09/19/2021   CREATININE 0.89 09/19/2021   BILITOT 0.5 09/19/2021   ALKPHOS 43 09/19/2021   AST 28 09/19/2021   ALT 38 09/19/2021   PROT 7.1 09/19/2021   ALBUMIN 4.5 09/19/2021   CALCIUM 9.8 09/19/2021   ANIONGAP 7 09/19/2021   EGFR 88 09/01/2021   Lab Results  Component Value Date   CHOL 230 (H) 09/01/2021   Lab Results  Component Value Date   HDL 41 09/01/2021   Lab Results  Component Value Date   LDLCALC 155 (H) 09/01/2021   Lab Results  Component Value Date   TRIG 186 (H) 09/01/2021   Lab Results  Component Value Date   CHOLHDL 5.6 (H) 09/01/2021   Lab Results  Component Value Date   HGBA1C 5.8 (H) 09/01/2021       Assessment & Plan:  Acute serous otitis media of left ear, recurrence not specified Assessment & Plan: Started Augmentin for 10 days  Follow up sooner if  You have severe pain that is not controlled with medicine. You have swelling, redness, or pain around your ear. You have stiffness in your neck. A part of your face is not moving (paralyzed). The bone behind your ear (mastoid bone) is tender when you touch it. You develop a severe headache.  Orders: -     Amoxicillin-Pot Clavulanate; Take 1 tablet by mouth 2 (two) times daily.  Dispense: 20 tablet; Refill: 0  Snoring Assessment & Plan: Sleep  study in place  Orders: -     Ambulatory referral to Sleep Studies  Other atopic dermatitis Assessment & Plan: Claritin 10 mg everyday  Try to be away from triggering agents  Orders: -     Loratadine; Take 1 tablet (10 mg total) by mouth daily.  Dispense: 90 tablet; Refill: 1      Meds ordered this encounter  Medications   amoxicillin-clavulanate (AUGMENTIN) 875-125 MG tablet    Sig: Take 1 tablet by mouth 2 (two) times daily.    Dispense:  20 tablet    Refill:  0    Order Specific Question:   Supervising Provider    Answer:   Shelton Silvas   loratadine  (CLARITIN) 10 MG tablet    Sig: Take 1 tablet (10 mg total) by mouth daily.    Dispense:  90 tablet    Refill:  1    Order Specific Question:   Supervising Provider    AnswerRochel Brome [phone]    Orders Placed This Encounter  Procedures   Ambulatory referral to Sleep Studies     Follow-up: Return in about 3 months (around 05/07/2022) for CHRONIC, FASTING. I, Neil Crouch have reviewed all documentation for this visit. The documentation on 02/05/22   for the exam, diagnosis, procedures, and orders are all accurate and complete.     An After Visit Summary was printed and given to the patient.  Neil Crouch, [place] [phone]

## 2022-02-05 NOTE — Assessment & Plan Note (Deleted)
Started Augmentin for 10 days  Follow up sooner if  You have severe pain that is not controlled with medicine. You have swelling, redness, or pain around your ear. You have stiffness in your neck. A part of your face is not moving (paralyzed). The bone behind your ear (mastoid bone) is tender when you touch it. You develop a severe headache.

## 2022-02-05 NOTE — Assessment & Plan Note (Signed)
Sleep study in place

## 2022-03-02 ENCOUNTER — Telehealth: Payer: Self-pay | Admitting: Internal Medicine

## 2022-03-02 NOTE — Telephone Encounter (Signed)
Called patient regarding upcoming March appointment, patient is notified.

## 2022-03-15 ENCOUNTER — Encounter: Payer: Self-pay | Admitting: Neurology

## 2022-03-15 ENCOUNTER — Ambulatory Visit (INDEPENDENT_AMBULATORY_CARE_PROVIDER_SITE_OTHER): Payer: BC Managed Care – PPO | Admitting: Neurology

## 2022-03-15 VITALS — BP 151/86 | HR 73 | Ht 71.0 in | Wt 335.0 lb

## 2022-03-15 DIAGNOSIS — Z9189 Other specified personal risk factors, not elsewhere classified: Secondary | ICD-10-CM | POA: Diagnosis not present

## 2022-03-15 DIAGNOSIS — R0681 Apnea, not elsewhere classified: Secondary | ICD-10-CM

## 2022-03-15 DIAGNOSIS — G4730 Sleep apnea, unspecified: Secondary | ICD-10-CM | POA: Diagnosis not present

## 2022-03-15 DIAGNOSIS — G4719 Other hypersomnia: Secondary | ICD-10-CM

## 2022-03-15 DIAGNOSIS — R0683 Snoring: Secondary | ICD-10-CM

## 2022-03-15 DIAGNOSIS — R030 Elevated blood-pressure reading, without diagnosis of hypertension: Secondary | ICD-10-CM

## 2022-03-15 DIAGNOSIS — Z6841 Body Mass Index (BMI) 40.0 and over, adult: Secondary | ICD-10-CM

## 2022-03-15 NOTE — Patient Instructions (Signed)

## 2022-03-15 NOTE — Progress Notes (Signed)
Subjective:    Patient ID: Jerry [name] is a 47 y.o. male.  HPI    Star Age, MD, PhD [facility] [address], [address].O. [place], [place] [address]  Dear Jerry [name],   I saw your patient, Jerry [name], upon your kind request in my sleep clinic today for initial consultation of his sleep disorder, in particular, concern for underlying obstructive sleep apnea.  The patient is unaccompanied today.  As you know, Jerry [name] is a 47 year old male with an underlying medical history of atopic dermatitis, lung cancer with status post right lobectomy, coronary artery disease, otalgia, aortic atherosclerosis, and severe obesity with a BMI of over 45, who reports snoring and excessive daytime somnolence as well as witnessed apneas, per GF/SO.  His Epworth sleepiness score is 7 out of 24, fatigue severity score is 41 out of 63.  He reports that he makes gasping sounds at night which are disturbing to his partner.  He is divorced twice, he has 2 children who will stay with him once a week.  He has no pets in his household.  He denies recurrent nocturnal or morning headaches or night to night nocturia, he is not aware of any family history of sleep apnea.  Bedtime is generally around 11 and rise time around 6.  He admits to weight gain since his cancer diagnosis.  He has caffeine in the form of soda, he estimates about 100 mg of caffeine per day.  He does not typically drink tea or coffee.  He quit drinking alcohol in 2015 and admits to having a diagnosis of alcohol use disorder, he goes to AA.  He quit smoking in 2009 and no longer vapes.  I reviewed your office note from 02/05/2022.  He has a TV in his bedroom but generally turns it off at night.  He works as an Clinical biochemist.  His Past Medical History Is Significant For: Past Medical History:  Diagnosis Date   Aortic atherosclerosis ([place]) 08/05/2020   per chest CT   CAD (coronary artery disease), native coronary artery  08/05/2020   Left anterior descending CAD noted on chest CT   Cancer [facility])    Disorder due to vaping    Former smoker    Otitis media 07/03/2021   Pneumonia     His Past Surgical History Is Significant For: Past Surgical History:  Procedure Laterality Date   INTERCOSTAL NERVE BLOCK Right 03/28/2020   Procedure: INTERCOSTAL NERVE BLOCK;  Surgeon: [name], MD;  Location: [place];  Service: Thoracic;  Laterality: Right;   LUNG LOBECTOMY Right 03/30/2020   LYMPH NODE DISSECTION Right 03/28/2020   Procedure: LYMPH NODE DISSECTION;  Surgeon: [name], MD;  Location: [place];  Service: Thoracic;  Laterality: Right;   [place] Right 03/02/2020   Procedure: VIDEO BRONCHOSCOPY WITH ENDOBRONCHIAL NAVIGATION;  Surgeon: Garner Nash, DO;  Location: [place];  Service: Pulmonary;  Laterality: Right;   VIDEO BRONCHOSCOPY WITH ENDOBRONCHIAL ULTRASOUND Bilateral 03/02/2020   Procedure: VIDEO BRONCHOSCOPY WITH ENDOBRONCHIAL ULTRASOUND;  Surgeon: Garner Nash, DO;  Location: [place];  Service: Pulmonary;  Laterality: Bilateral;   WISDOM TOOTH EXTRACTION      His Family History Is Significant For: Family History  Problem Relation Age of Onset   Stroke Father    Hypertension Father    Hyperlipidemia Father    Arrhythmia Father    Lung cancer Paternal Aunt    Sleep apnea Neg Hx  His Social History Is Significant For: Social History   Socioeconomic History   Marital status: Married    Spouse name: Not on file   Number of children: 6   Years of education: Not on file   Highest education level: Not on file  Occupational History   Occupation: Clinical biochemist  Tobacco Use   Smoking status: Former    Packs/day: 1.00    Years: 17.00    Total pack years: 17.00    Types: Cigarettes    Start date: 03/01/1991    Quit date: 02/29/2008    Years since quitting: 14.0   Smokeless tobacco: Never  Vaping Use   Vaping Use: Former   Start date:  02/08/2013   Quit date: 02/29/2020   Substances: Nicotine  Substance and Sexual Activity   Alcohol use: Not Currently    Comment: 7 years sober.   Drug use: Never   Sexual activity: Yes    Partners: Female  Other Topics Concern   Not on file  Social History Narrative   Not on file   Social Determinants of Health   Financial Resource Strain: Low Risk  (09/01/2021)   Overall Financial Resource Strain (CARDIA)    Difficulty of Paying Living Expenses: Not hard at all  Food Insecurity: No Food Insecurity (09/01/2021)   Hunger Vital Sign    Worried About Running Out of Food in the Last Year: Never true    Ran Out of Food in the Last Year: Never true  Transportation Needs: No Transportation Needs (09/01/2021)   PRAPARE - Hydrologist (Medical): No    Lack of Transportation (Non-Medical): No  Physical Activity: Inactive (09/01/2021)   Exercise Vital Sign    Days of Exercise per Week: 0 days    Minutes of Exercise per Session: 0 min  Stress: No Stress Concern Present (09/01/2021)   [place]    Feeling of Stress : Not at all  Social Connections: Moderately Integrated (09/01/2021)   Social Connection and Isolation Panel [NHANES]    Frequency of Communication with Friends and Family: More than three times a week    Frequency of Social Gatherings with Friends and Family: More than three times a week    Attends Religious Services: More than 4 times per year    Active Member of [employer] or Organizations: No    Attends Archivist Meetings: Never    Marital Status: Married    His Allergies Are:  Allergies  Allergen Reactions   Olanzapine Other (See Comments)    "Nightmares, night sweats"  :   His Current Medications Are:  Outpatient Encounter Medications as of 03/15/2022  Medication Sig   desonide (DESOWEN) 0.05 % cream Apply topically 2 (two) times daily. Apply to eyelid twice daily    triamcinolone cream (KENALOG) 0.1 % Apply 1 application. topically 2 (two) times daily.   amoxicillin-clavulanate (AUGMENTIN) 875-125 MG tablet Take 1 tablet by mouth 2 (two) times daily.   loratadine (CLARITIN) 10 MG tablet Take 1 tablet (10 mg total) by mouth daily.   rosuvastatin (CRESTOR) 5 MG tablet Take 1 tablet (5 mg total) by mouth daily. (Patient not taking: Reported on 02/05/2022)   Facility-Administered Encounter Medications as of 03/15/2022  Medication   0.9 %  sodium chloride infusion   0.9 %  sodium chloride infusion   ondansetron (ZOFRAN) injection 8 mg  :   Review of Systems:  Out of a complete  14 point review of systems, all are reviewed and negative with the exception of these symptoms as listed below:   Review of Systems  Neurological:        Pt here for sleep consult Pt snores,fatigue,little headaches  Pt denies hypertension,sleep study,CPAP machine    ESS:7 FSS:41    Objective:  Neurological Exam  Physical Exam Physical Examination:   Vitals:   03/15/22 1516  BP: (!) 151/86  Pulse: 73    General Examination: The patient is a very pleasant 47 y.o. male in no acute distress. He appears well-developed and well-nourished and well groomed.   HEENT: Normocephalic, atraumatic, pupils are equal, round and reactive to light, extraocular tracking is good without limitation to gaze excursion or nystagmus noted. Hearing is grossly intact. Face is symmetric with normal facial animation. Speech is clear with no dysarthria noted. There is no hypophonia. There is no lip, neck/head, jaw or voice tremor. Neck is supple with full range of passive and active motion. There are no carotid bruits on auscultation. Oropharynx exam reveals: mild mouth dryness, adequate dental hygiene and moderate airway crowding, due to larger uvula, tonsillar size of about 1-2+ bilaterally.  Mallampati class II.  Neck circumference 19-3/8 inches.  No significant overbite.  Tongue protrudes centrally  and palate elevates symmetrically.  Chest: Clear to auscultation without wheezing, rhonchi or crackles noted.  Heart: S1+S2+0, regular and normal without murmurs, rubs or gallops noted.   Abdomen: Soft, non-tender and non-distended.  Extremities: There is no obvious edema in the distal lower extremities bilaterally.   Skin: Warm and dry without trophic changes noted.   Musculoskeletal: exam reveals no obvious joint deformities.   Neurologically:  Mental status: The patient is awake, alert and oriented in all 4 spheres. His immediate and remote memory, attention, language skills and fund of knowledge are appropriate. There is no evidence of aphasia, agnosia, apraxia or anomia. Speech is clear with normal prosody and enunciation. Thought process is linear. Mood is normal and affect is normal.  Cranial nerves II - XII are as described above under HEENT exam.  Motor exam: Normal bulk, strength and tone is noted. There is no obvious action or resting tremor.  Fine motor skills and coordination: grossly intact.  Cerebellar testing: No dysmetria or intention tremor. There is no truncal or gait ataxia.  Sensory exam: intact to light touch in the upper and lower extremities.  Gait, station and balance: He stands easily. No veering to one side is noted. No leaning to one side is noted. Posture is age-appropriate and stance is narrow based. Gait shows normal stride length and normal pace. No problems turning are noted.   Assessment and Plan:  In summary, Jerry [name] is a very pleasant 47 y.o.-year old male with an underlying medical history of atopic dermatitis, lung cancer with status post right lobectomy, coronary artery disease, otalgia, aortic atherosclerosis, and severe obesity with a BMI of over 31, whose history and physical exam are concerning for sleep disordered breathing, supporting a current working diagnosis of unspecified sleep apnea, with the main differential diagnoses of obstructive  sleep apnea (OSA) versus upper airway resistance syndrome (UARS) versus central sleep apnea (CSA), or mixed sleep apnea. While a laboratory attended sleep study is typically considered "gold standard" for evaluation of sleep disordered breathing, we mutually agreed to pursue a home sleep test at this time.   I had a long chat with the patient about my findings and the diagnosis of sleep apnea, particularly OSA, its  prognosis and treatment options. We talked about medical/conservative treatments, surgical interventions and non-pharmacological approaches for symptom control. I explained, in particular, the risks and ramifications of untreated moderate to severe OSA, especially with respect to developing cardiovascular disease down the road, including congestive heart failure (CHF), difficult to treat hypertension, cardiac arrhythmias (particularly A-fib), neurovascular complications including TIA, stroke and dementia. Even type 2 diabetes has, in part, been linked to untreated OSA. Symptoms of untreated OSA may include (but may not be limited to) daytime sleepiness, nocturia (i.e. frequent nighttime urination), memory problems, mood irritability and suboptimally controlled or worsening mood disorder such as depression and/or anxiety, lack of energy, lack of motivation, physical discomfort, as well as recurrent headaches, especially morning or nocturnal headaches. We talked about the importance of maintaining a healthy lifestyle and striving for healthy weight. In addition, we talked about the importance of striving for and maintaining good sleep hygiene. I recommended a sleep study at this time. I outlined the differences between a laboratory attended sleep study which is considered more comprehensive and accurate over the option of a home sleep test (HST); the latter may lead to underestimation of sleep disordered breathing in some instances and does not help with diagnosing upper airway resistance syndrome and is  not accurate enough to diagnose primary central sleep apnea typically. I outlined possible surgical and non-surgical treatment options of OSA, including the use of a positive airway pressure (PAP) device (i.e. CPAP, AutoPAP/APAP or BiPAP in certain circumstances), a custom-made dental device (aka oral appliance, which would require a referral to a specialist dentist or orthodontist typically, and is generally speaking not considered for patients with full dentures or edentulous state), upper airway surgical options, such as traditional UPPP (which is not considered a first-line treatment) or the Inspire device (hypoglossal nerve stimulator, which would involve a referral for consultation with an ENT surgeon, after careful selection, following inclusion criteria - also not first-line treatment). I explained the PAP treatment option to the patient in detail, as this is generally considered first-line treatment.  The patient indicated that he would be willing to try PAP therapy, if the need arises. I explained the importance of being compliant with PAP treatment, not only for insurance purposes but primarily to improve patient's symptoms symptoms, and for the patient's long term health benefit, including to reduce His cardiovascular risks longer-term.    We will pick up our discussion about the next steps and treatment options after testing.  We will keep him posted as to the test results by phone call and/or MyChart messaging where possible.  We will plan to follow-up in sleep clinic accordingly as well.  I answered all his questions today and the patient was in agreement.   I encouraged him to call with any interim questions, concerns, problems or updates or email [place] through [place].  Generally speaking, sleep test authorizations may take up to 2 weeks, sometimes less, sometimes longer, the patient is encouraged to get in touch with [place] if they do not hear back from the sleep lab staff directly within the next 2  weeks.  Thank you very much for allowing me to participate in the care of this nice patient. If I can be of any further assistance to you please do not hesitate to call me at [phone].  Sincerely,   Star Age, MD, PhD

## 2022-03-19 ENCOUNTER — Inpatient Hospital Stay: Payer: BC Managed Care – PPO | Attending: Nurse Practitioner

## 2022-03-19 ENCOUNTER — Other Ambulatory Visit: Payer: Self-pay

## 2022-03-19 ENCOUNTER — Ambulatory Visit (HOSPITAL_COMMUNITY)
Admission: RE | Admit: 2022-03-19 | Discharge: 2022-03-19 | Disposition: A | Discharge status: Home / Self Care | Payer: BC Managed Care – PPO | Source: Ambulatory Visit | Attending: Internal Medicine | Admitting: Internal Medicine

## 2022-03-19 ENCOUNTER — Encounter (HOSPITAL_COMMUNITY): Payer: Self-pay

## 2022-03-19 DIAGNOSIS — Z9221 Personal history of antineoplastic chemotherapy: Secondary | ICD-10-CM | POA: Insufficient documentation

## 2022-03-19 DIAGNOSIS — C3490 Malignant neoplasm of unspecified part of unspecified bronchus or lung: Secondary | ICD-10-CM | POA: Insufficient documentation

## 2022-03-19 DIAGNOSIS — C3411 Malignant neoplasm of upper lobe, right bronchus or lung: Secondary | ICD-10-CM | POA: Insufficient documentation

## 2022-03-19 DIAGNOSIS — Z902 Acquired absence of lung [part of]: Secondary | ICD-10-CM | POA: Insufficient documentation

## 2022-03-19 LAB — CMP (CANCER CENTER ONLY)
ALT: 38 U/L (ref 0–44)
AST: 27 U/L (ref 15–41)
Albumin: 4.6 g/dL (ref 3.5–5.0)
Alkaline Phosphatase: 51 U/L (ref 38–126)
Anion gap: 8 (ref 5–15)
BUN: 17 mg/dL (ref 6–20)
CO2: 30 mmol/L (ref 22–32)
Calcium: 9.6 mg/dL (ref 8.9–10.3)
Chloride: 102 mmol/L (ref 98–111)
Creatinine: 0.99 mg/dL (ref 0.61–1.24)
GFR, Estimated: >60 mL/min (ref >60)
Glucose, Bld: 91 mg/dL (ref 70–99)
Potassium: 4 mmol/L (ref 3.5–5.1)
Sodium: 140 mmol/L (ref 135–145)
Total Bilirubin: 0.7 mg/dL (ref 0.3–1.2)
Total Protein: 7.5 g/dL (ref 6.5–8.1)

## 2022-03-19 LAB — CBC WITH DIFFERENTIAL (CANCER CENTER ONLY)
Abs Immature Granulocytes: 0.08 10*3/uL — ABNORMAL HIGH (ref 0.00–0.07)
Basophils Absolute: 0.1 10*3/uL (ref 0.0–0.1)
Basophils Relative: 1 %
Eosinophils Absolute: 0.1 10*3/uL (ref 0.0–0.5)
Eosinophils Relative: 1 %
HCT: 44.8 % (ref 39.0–52.0)
Hemoglobin: 15.6 g/dL (ref 13.0–17.0)
Immature Granulocytes: 1 %
Lymphocytes Relative: 23 %
Lymphs Abs: 2.1 10*3/uL (ref 0.7–4.0)
MCH: 31.8 pg (ref 26.0–34.0)
MCHC: 34.8 g/dL (ref 30.0–36.0)
MCV: 91.2 fL (ref 80.0–100.0)
Monocytes Absolute: 0.9 10*3/uL (ref 0.1–1.0)
Monocytes Relative: 10 %
Neutro Abs: 5.7 10*3/uL (ref 1.7–7.7)
Neutrophils Relative %: 64 %
Platelet Count: 234 10*3/uL (ref 150–400)
RBC: 4.91 MIL/uL (ref 4.22–5.81)
RDW: 12.7 % (ref 11.5–15.5)
WBC Count: 8.8 10*3/uL (ref 4.0–10.5)
nRBC: 0 % (ref 0.0–0.2)

## 2022-03-19 MED ORDER — IOHEXOL 300 MG/ML  SOLN
75.0000 mL | Freq: Once | INTRAMUSCULAR | Status: AC | PRN
  Administered 2022-03-19: 75 mL via INTRAVENOUS

## 2022-03-21 ENCOUNTER — Inpatient Hospital Stay (HOSPITAL_BASED_OUTPATIENT_CLINIC_OR_DEPARTMENT_OTHER): Payer: BC Managed Care – PPO | Admitting: Internal Medicine

## 2022-03-21 ENCOUNTER — Other Ambulatory Visit: Payer: Self-pay

## 2022-03-21 ENCOUNTER — Other Ambulatory Visit: Payer: Self-pay | Admitting: Medical Oncology

## 2022-03-21 VITALS — BP 150/76 | HR 85 | Temp 98.0°F | Resp 18 | Wt 331.0 lb

## 2022-03-21 DIAGNOSIS — C3411 Malignant neoplasm of upper lobe, right bronchus or lung: Secondary | ICD-10-CM | POA: Diagnosis not present

## 2022-03-21 DIAGNOSIS — C3490 Malignant neoplasm of unspecified part of unspecified bronchus or lung: Secondary | ICD-10-CM

## 2022-03-21 DIAGNOSIS — Z902 Acquired absence of lung [part of]: Secondary | ICD-10-CM | POA: Diagnosis not present

## 2022-03-21 DIAGNOSIS — Z9221 Personal history of antineoplastic chemotherapy: Secondary | ICD-10-CM | POA: Diagnosis not present

## 2022-03-21 NOTE — Progress Notes (Signed)
[place] [phone]   [phone]  OFFICE PROGRESS NOTE  Neil Crouch, FNP [address] [place] [address]  DIAGNOSIS: Stage IIA (T2b, N0, M0) non-small cell lung cancer, adenocarcinoma presented with right upper lobe lung mass diagnosed in February 2022.  Biomarker Findings Microsatellite status - MS-Stable Tumor Mutational Burden - 4 Muts/Mb Genomic Findings For a complete list of the genes assayed, please refer to the Appendix. KRAS G12D KEAP1 P278S 7 Disease relevant genes with no reportable alterations: ALK, BRAF, EGFR, ERBB2, MET, RET, ROS1  PDL1 Expression 5%.  PRIOR THERAPY:  1) Status post robotic assisted right video thoracoscopy with right upper lobectomy and mediastinal lymph node sampling under the care of Dr. Kipp Brood on March 28, 2020. 2) Adjuvant systemic chemotherapy with cisplatin 75 Mg/M2 and Alimta 500 Mg/M2 every 3 weeks.  First dose May 05, 2020.  Status post 4 cycles.  Last dose was giving July 07, 2020.  Cisplatin was discontinued after cycle #2 secondary to uncontrolled nausea and vomiting and it was replaced with carboplatin for the last 2 cycles.  CURRENT THERAPY: Observation.  INTERVAL HISTORY: Jerry [name] 47 y.o. male returns to the clinic today for follow-up visit.  The patient is feeling fine today with no concerning complaints except for fatigue and shortness of breath with exertion.  He does a lot of weight recently and that may be contributing to his shortness of breath.  He denied having any chest pain, cough or hemoptysis.  He has no nausea, vomiting, diarrhea or constipation.  He has no headache or visual changes.  He denied having any weight loss or night sweats.  He had repeat CT scan of the chest performed recently and is here for evaluation and discussion of his scan results.  MEDICAL HISTORY: Past Medical History:  Diagnosis Date   Aortic atherosclerosis ([place]) 08/05/2020   per  chest CT   CAD (coronary artery disease), native coronary artery 08/05/2020   Left anterior descending CAD noted on chest CT   Disorder due to vaping    Former smoker    Intractable nausea and vomiting 05/11/2020   lung ca 01/2020   Otitis media 07/03/2021   Pneumonia     ALLERGIES:  is allergic to olanzapine.  MEDICATIONS:  Current Outpatient Medications  Medication Sig Dispense Refill   amoxicillin-clavulanate (AUGMENTIN) 875-125 MG tablet Take 1 tablet by mouth 2 (two) times daily. 20 tablet 0   desonide (DESOWEN) 0.05 % cream Apply topically 2 (two) times daily. Apply to eyelid twice daily 30 g 0   loratadine (CLARITIN) 10 MG tablet Take 1 tablet (10 mg total) by mouth daily. 90 tablet 1   rosuvastatin (CRESTOR) 5 MG tablet Take 1 tablet (5 mg total) by mouth daily. (Patient not taking: Reported on 02/05/2022) 30 tablet 2   triamcinolone cream (KENALOG) 0.1 % Apply 1 application. topically 2 (two) times daily. 30 g 0   No current facility-administered medications for this visit.   Facility-Administered Medications Ordered in Other Visits  Medication Dose Route Frequency Provider Last Rate Last Admin   0.9 %  sodium chloride infusion   Intravenous Continuous Curt Bears, MD       0.9 %  sodium chloride infusion   Intravenous Continuous Curt Bears, MD 900 mL/hr at 05/11/20 0940 New Bag at 05/11/20 0940   ondansetron (ZOFRAN) injection 8 mg  8 mg Intravenous Once Ardeen Garland, RN  SURGICAL HISTORY:  Past Surgical History:  Procedure Laterality Date   INTERCOSTAL NERVE BLOCK Right 03/28/2020   Procedure: INTERCOSTAL NERVE BLOCK;  Surgeon: [name], MD;  Location: [place];  Service: Thoracic;  Laterality: Right;   LUNG LOBECTOMY Right 03/30/2020   LYMPH NODE DISSECTION Right 03/28/2020   Procedure: LYMPH NODE DISSECTION;  Surgeon: [name], MD;  Location: [place];  Service: Thoracic;  Laterality: Right;   VIDEO BRONCHOSCOPY WITH ENDOBRONCHIAL  NAVIGATION Right 03/02/2020   Procedure: VIDEO BRONCHOSCOPY WITH ENDOBRONCHIAL NAVIGATION;  Surgeon: Garner Nash, DO;  Location: [place];  Service: Pulmonary;  Laterality: Right;   VIDEO BRONCHOSCOPY WITH ENDOBRONCHIAL ULTRASOUND Bilateral 03/02/2020   Procedure: VIDEO BRONCHOSCOPY WITH ENDOBRONCHIAL ULTRASOUND;  Surgeon: Garner Nash, DO;  Location: [place];  Service: Pulmonary;  Laterality: Bilateral;   WISDOM TOOTH EXTRACTION      REVIEW OF SYSTEMS:  A comprehensive review of systems was negative except for: Respiratory: positive for dyspnea on exertion   PHYSICAL EXAMINATION: General appearance: alert, cooperative, fatigued, and no distress Head: Normocephalic, without obvious abnormality, atraumatic Neck: no adenopathy, no JVD, supple, symmetrical, trachea midline, and thyroid not enlarged, symmetric, no tenderness/mass/nodules Lymph nodes: Cervical, supraclavicular, and axillary nodes normal. Resp: clear to auscultation bilaterally Back: symmetric, no curvature. ROM normal. No CVA tenderness. Cardio: regular rate and rhythm, S1, S2 normal, no murmur, click, rub or gallop GI: soft, non-tender; bowel sounds normal; no masses,  no organomegaly Extremities: extremities normal, atraumatic, no cyanosis or edema  ECOG PERFORMANCE STATUS: 1 - Symptomatic but completely ambulatory  Blood pressure (!) 150/76, pulse 85, temperature 98 F (36.7 C), temperature source Oral, resp. rate 18, weight (!) 331 lb (150.1 kg), SpO2 99 %.  LABORATORY DATA: Lab Results  Component Value Date   WBC 8.8 03/19/2022   HGB 15.6 03/19/2022   HCT 44.8 03/19/2022   MCV 91.2 03/19/2022   PLT 234 03/19/2022      Chemistry      Component Value Date/Time   NA 140 03/19/2022 1503   NA 142 09/01/2021 0756   K 4.0 03/19/2022 1503   CL 102 03/19/2022 1503   CO2 30 03/19/2022 1503   BUN 17 03/19/2022 1503   BUN 20 09/01/2021 0756   CREATININE 0.99 03/19/2022 1503      Component Value Date/Time    CALCIUM 9.6 03/19/2022 1503   ALKPHOS 51 03/19/2022 1503   AST 27 03/19/2022 1503   ALT 38 03/19/2022 1503   BILITOT 0.7 03/19/2022 1503       RADIOGRAPHIC STUDIES: CT Chest W Contrast  Result Date: 03/20/2022 CLINICAL DATA:  Non-small-cell lung cancer. Restaging. * Tracking Code: BO * EXAM: CT CHEST WITH CONTRAST TECHNIQUE: Multidetector CT imaging of the chest was performed during intravenous contrast administration. RADIATION DOSE REDUCTION: This exam was performed according to the departmental dose-optimization program which includes automated exposure control, adjustment of the mA and/or kV according to patient size and/or use of iterative reconstruction technique. CONTRAST:  76m OMNIPAQUE IOHEXOL 300 MG/ML  SOLN COMPARISON:  09/19/2021. FINDINGS: Cardiovascular: The heart size is normal. No substantial pericardial effusion. Coronary artery calcification is evident. No thoracic aortic aneurysm. Mediastinum/Nodes: No mediastinal lymphadenopathy. There is no hilar lymphadenopathy. The esophagus has normal imaging features. There is no axillary lymphadenopathy. Lungs/Pleura: Surgical changes right hilum compatible with reported history of prior right upper lobectomy. No new suspicious pulmonary nodule or mass. No focal airspace consolidation. No pleural effusion. Upper Abdomen: The liver shows diffusely decreased attenuation suggesting  fat deposition. Visualized upper abdomen otherwise unremarkable. Musculoskeletal: No worrisome lytic or sclerotic osseous abnormality. IMPRESSION: 1. Status post right upper lobectomy. No evidence for recurrent or metastatic disease in the chest. 2. Hepatic steatosis. Electronically Signed   By: Misty Stanley M.D.   On: 03/20/2022 12:59     ASSESSMENT AND PLAN: This is a very pleasant 47 years old white male recently diagnosed with stage IIA (T2b, N0, M0) non-small cell lung cancer, adenocarcinoma status post right upper lobectomy with lymph node sampling under the  care of Dr. Kipp Brood on March 28, 2020. The patient had molecular studies by foundation 1 that showed no actionable mutation and his PD-L1 expression was 5%. The patient underwent adjuvant systemic chemotherapy with cisplatin 75 Mg/M2 and Alimta 500 Mg/M2.  Status post 4 cycles.  Cisplatin was replaced with carboplatin starting from cycle #3.  The patient tolerated his treatment well except for significant nausea and vomiting during the cisplatin treatment. The patient is currently on observation and he is feeling fine with no concerning complaints except for the baseline shortness of breath. He had repeat CT scan of the chest performed recently.  I personally and independently reviewed the scan and discussed the result with the patient today. His scan showed no concerning findings for disease recurrence or metastasis. I recommended for the patient to continue on observation with repeat CT scan of the chest in 9 months. He was advised to call immediately if he has any concerning symptoms in the interval. The patient voices understanding of current disease status and treatment options and is in agreement with the current care plan. The total time spent in the appointment was 20 minutes.  All questions were answered. The patient knows to call the clinic with any problems, questions or concerns. We can certainly see the patient much sooner if necessary.   Disclaimer: This note was dictated with voice recognition software. Similar sounding words can inadvertently be transcribed and may not be corrected upon review.

## 2022-04-24 ENCOUNTER — Telehealth: Payer: Self-pay | Admitting: Neurology

## 2022-04-24 NOTE — Telephone Encounter (Signed)
[employer] no auth req spoke to [place] ref # [id number]   Sent mychart

## 2022-05-08 ENCOUNTER — Ambulatory Visit: Payer: BC Managed Care – PPO | Admitting: Neurology

## 2022-05-08 DIAGNOSIS — G4730 Sleep apnea, unspecified: Secondary | ICD-10-CM

## 2022-05-08 DIAGNOSIS — G4733 Obstructive sleep apnea (adult) (pediatric): Secondary | ICD-10-CM | POA: Diagnosis not present

## 2022-05-08 DIAGNOSIS — G4731 Primary central sleep apnea: Secondary | ICD-10-CM

## 2022-05-08 DIAGNOSIS — R030 Elevated blood-pressure reading, without diagnosis of hypertension: Secondary | ICD-10-CM

## 2022-05-08 DIAGNOSIS — Z9189 Other specified personal risk factors, not elsewhere classified: Secondary | ICD-10-CM

## 2022-05-08 DIAGNOSIS — G4719 Other hypersomnia: Secondary | ICD-10-CM

## 2022-05-08 DIAGNOSIS — R0683 Snoring: Secondary | ICD-10-CM

## 2022-05-08 DIAGNOSIS — R0681 Apnea, not elsewhere classified: Secondary | ICD-10-CM

## 2022-05-10 NOTE — Addendum Note (Signed)
Addended by: Huston Foley on: 05/10/2022 06:48 PM   Modules accepted: Orders

## 2022-05-10 NOTE — Progress Notes (Signed)
See procedure note.

## 2022-05-10 NOTE — Procedures (Signed)
[facility]  HOME SLEEP TEST (Watch PAT) REPORT  STUDY DATE: 05/08/2022  DOB: [birth year]  MRN: [id number]  ORDERING CLINICIAN: Huston Foley, MD, PhD   REFERRING CLINICIAN: Lurline Del, NP   CLINICAL INFORMATION/HISTORY: 47 year old male with an underlying medical history of atopic dermatitis, lung cancer with status post right lobectomy, coronary artery disease, otalgia, aortic atherosclerosis, and severe obesity with a BMI of over 45, who reports snoring and excessive daytime somnolence as well as witnessed apneas.   Epworth sleepiness score: 7/24.  BMI: 46.7 kg/m  FINDINGS:   Sleep Summary:   Total Recording Time (hours, min): 8 hours, 25 min  Total Sleep Time (hours, min):  7 hours, 52 min  Percent REM (%):    21.7%   Respiratory Indices:   Calculated pAHI (per hour):  50.3/hour         REM pAHI:    57.7/hour       NREM pAHI: 48.3/hour  Central pAHI: 14/hour  Oxygen Saturation Statistics:    Oxygen Saturation (%) Mean: 92%   Minimum oxygen saturation (%):                 80%   O2 Saturation Range (%): 80 - 99%    O2 Saturation (minutes) <=88%: 12.1 min  Pulse Rate Statistics:   Pulse Mean (bpm):    57/min    Pulse Range (43 - 101/min)   IMPRESSION: OSA (obstructive sleep apnea), severe Central Sleep Apnea  RECOMMENDATION:  This home sleep test demonstrates severe obstructive sleep apnea with a total AHI of 50.3/hour and O2 nadir of 80%.  Time below or at 88% saturation was over 12 minutes for the night.  Snoring was detected, in the mild to moderate range, at times intermittent and in the mild range, at times louder.  He has a milder central apnea component as well.  Treatment with positive airway pressure is highly recommended. The patient will be advised to proceed with an autoPAP titration/trial at home. A laboratory attended titration study can be considered in the future for optimization of treatment settings and to improve  tolerance and compliance. Alternative treatment options are limited secondary to the severity of the patient's sleep disordered breathing, but may include surgical treatment with an implantable hypoglossal nerve stimulator (in carefully selected candidates, meeting criteria).  Concomitant weight loss is recommended, where clinically appropriate. Please note, that untreated obstructive sleep apnea may carry additional perioperative morbidity. Patients with significant obstructive sleep apnea should receive perioperative PAP therapy and the surgeons and particularly the anesthesiologist should be informed of the diagnosis and the severity of the sleep disordered breathing. The patient should be cautioned not to drive, work at heights, or operate dangerous or heavy equipment when tired or sleepy. Review and reiteration of good sleep hygiene measures should be pursued with any patient. Other causes of the patient's symptoms, including circadian rhythm disturbances, an underlying mood disorder, medication effect and/or an underlying medical problem cannot be ruled out based on this test. Clinical correlation is recommended.  The patient and his referring provider will be notified of the test results. The patient will be seen in follow up in sleep clinic at [facility].  I certify that I have reviewed the raw data recording prior to the issuance of this report in accordance with the standards of the American Academy of Sleep Medicine (AASM).  INTERPRETING PHYSICIAN:   Huston Foley, MD, PhD Medical Director, [facility] Diplomat, ABPN (Neurology and Sleep)  [facility] [address] [place], [place] [address] [phone]

## 2022-05-11 ENCOUNTER — Ambulatory Visit: Payer: BC Managed Care – PPO | Admitting: Physician Assistant

## 2022-05-15 ENCOUNTER — Telehealth: Payer: Self-pay | Admitting: *Deleted

## 2022-05-15 NOTE — Telephone Encounter (Signed)
I called the patient and discussed his sleep study results. The patient understands his HST showed severe OSA and Dr Frances Furbish has recommended urgent setup on autopap, which is a cpap-like machine but with self adjusting pressures. Patient is ok with moving forward with process of obtaining autopap but he did want to discuss the cost. I advised that his DME will discuss those details and work with him as much as they can. He did not have a preference for DME. We will send to [facility]. Patient understands the insurance compliance requirements which includes using the machine at least 4 hours at night and also being seen in our office between 30 and 90 days after set up.  The patient scheduled an appointment for Wednesday, August 01, 2022 at 7:45 AM, arrival 15 to 30 minutes early.  The patient his questions were answered and he verbalized appreciation for the call.  He will be on the look out for a call from [facility].  Urgent referral faxed to [facility]. Received a receipt of confirmation.

## 2022-05-15 NOTE — Telephone Encounter (Signed)
-----   Message from Huston Foley, MD sent at 05/10/2022  6:48 PM EDT ----- Patient referred by PCP NP, seen by me on 03/15/2022, patient had a HST on 05/08/2022.   Urgent set up requested on PAP therapy, due to severe OSA.  Please call and notify the patient that the recent home sleep test showed obstructive sleep apnea in the severe range. I recommend treatment for this in the form of autoPAP, which means, that we don't have to bring him in for a sleep study with CPAP, but will let him start using a so called autoPAP machine at home, through a DME company (of his choice, or as per insurance requirement). The DME representative will fit the patient with a mask of choice, educate him on how to use the machine, how to put the mask on, etc. I have placed an order in the chart. Please send the order to a local DME, talk to patient, send report to referring MD. Please also reinforce the need for compliance with treatment. We will need a FU in sleep clinic for 10 weeks post-PAP set up, please arrange that with me or one of our NPs. Thanks,   Huston Foley, MD, PhD [facility])

## 2022-05-30 DIAGNOSIS — G4733 Obstructive sleep apnea (adult) (pediatric): Secondary | ICD-10-CM | POA: Diagnosis not present

## 2022-06-11 ENCOUNTER — Telehealth: Payer: Self-pay

## 2022-06-11 NOTE — Telephone Encounter (Signed)
PATIENT CALLED REQUESTING REFERRAL TO ENT, TOLD THE PATIENT SINCE IT HAD BEEN SINCE FEBRUARY THAT HE WAS SEEN FOR HIS EAR HE WOULD NEED TO BE SEEN AGAIN TO BE ABLE TO GET THE REFERRAL DONE. PATIENT STATED THAT HE WOULD JUST DEAL WITH IT AND SAID OK GOODBYE AND HUNG UP.

## 2022-06-13 DIAGNOSIS — G4733 Obstructive sleep apnea (adult) (pediatric): Secondary | ICD-10-CM | POA: Diagnosis not present

## 2022-06-30 DIAGNOSIS — G4733 Obstructive sleep apnea (adult) (pediatric): Secondary | ICD-10-CM | POA: Diagnosis not present

## 2022-07-05 ENCOUNTER — Telehealth: Payer: Self-pay | Admitting: Neurology

## 2022-07-05 DIAGNOSIS — G4731 Primary central sleep apnea: Secondary | ICD-10-CM

## 2022-07-05 DIAGNOSIS — G4733 Obstructive sleep apnea (adult) (pediatric): Secondary | ICD-10-CM

## 2022-07-05 NOTE — Telephone Encounter (Signed)
I called the pt and discussed Dr Teofilo Pod message below. I told him his DME company would be the one to change the pressure. He verbalized understanding.   Order for therapy setting change sent to [facility].

## 2022-07-05 NOTE — Telephone Encounter (Signed)
[facility] confirmed receipt of order.  

## 2022-07-05 NOTE — Telephone Encounter (Signed)
I reviewed patient's AutoPap compliance data from 05/29/2022 through 06/27/2022, during which time the patient was compliant with treatment, pressure range of 7 to 14 cm with EPR of 1.  Residual AHI mildly elevated at 14.4/h, it looks like he has good apnea control for obstructive events but has mild central events with a central index of 11.6/h.  95th percentile of pressure at 10.1 cm, leak acceptable but on the higher side at 16.7 L/min.  I would recommend we try to change his AutoPap to a set pressure of 11 cm with EPR of 2.  I placed the order.  Please notify patient and DME company to see if his central apneas improve with CPAP therapy.

## 2022-07-06 NOTE — Telephone Encounter (Signed)
Pt reports that his CPAP is blowing out too much air, he will not use over the weekend, he is asking Dr Frances Furbish has it bumped down to maybe a 9, please call.

## 2022-07-09 NOTE — Telephone Encounter (Signed)
Received order for pressure change. Sent community message this am

## 2022-07-09 NOTE — Telephone Encounter (Signed)
Request sent to Dr Frances Furbish with copy  of DL

## 2022-07-09 NOTE — Addendum Note (Signed)
Addended by: Huston Foley on: 07/09/2022 08:45 AM   Modules accepted: Orders

## 2022-07-30 DIAGNOSIS — G4733 Obstructive sleep apnea (adult) (pediatric): Secondary | ICD-10-CM | POA: Diagnosis not present

## 2022-08-01 ENCOUNTER — Ambulatory Visit: Payer: BC Managed Care – PPO | Admitting: Neurology

## 2022-08-30 DIAGNOSIS — G4733 Obstructive sleep apnea (adult) (pediatric): Secondary | ICD-10-CM | POA: Diagnosis not present

## 2022-09-03 ENCOUNTER — Ambulatory Visit: Payer: BC Managed Care – PPO | Admitting: Nurse Practitioner

## 2022-09-07 DIAGNOSIS — H6092 Unspecified otitis externa, left ear: Secondary | ICD-10-CM | POA: Diagnosis not present

## 2022-09-24 IMAGING — DX DG CHEST 1V PORT
1 series · 1 of 1 positions shown · non-contrast
Comparison: 02/22/2020, 03/01/2020

CLINICAL DATA: Bronchoscopy, right lung mass, biopsy

EXAM:
PORTABLE CHEST 1 VIEW

[chest ap]
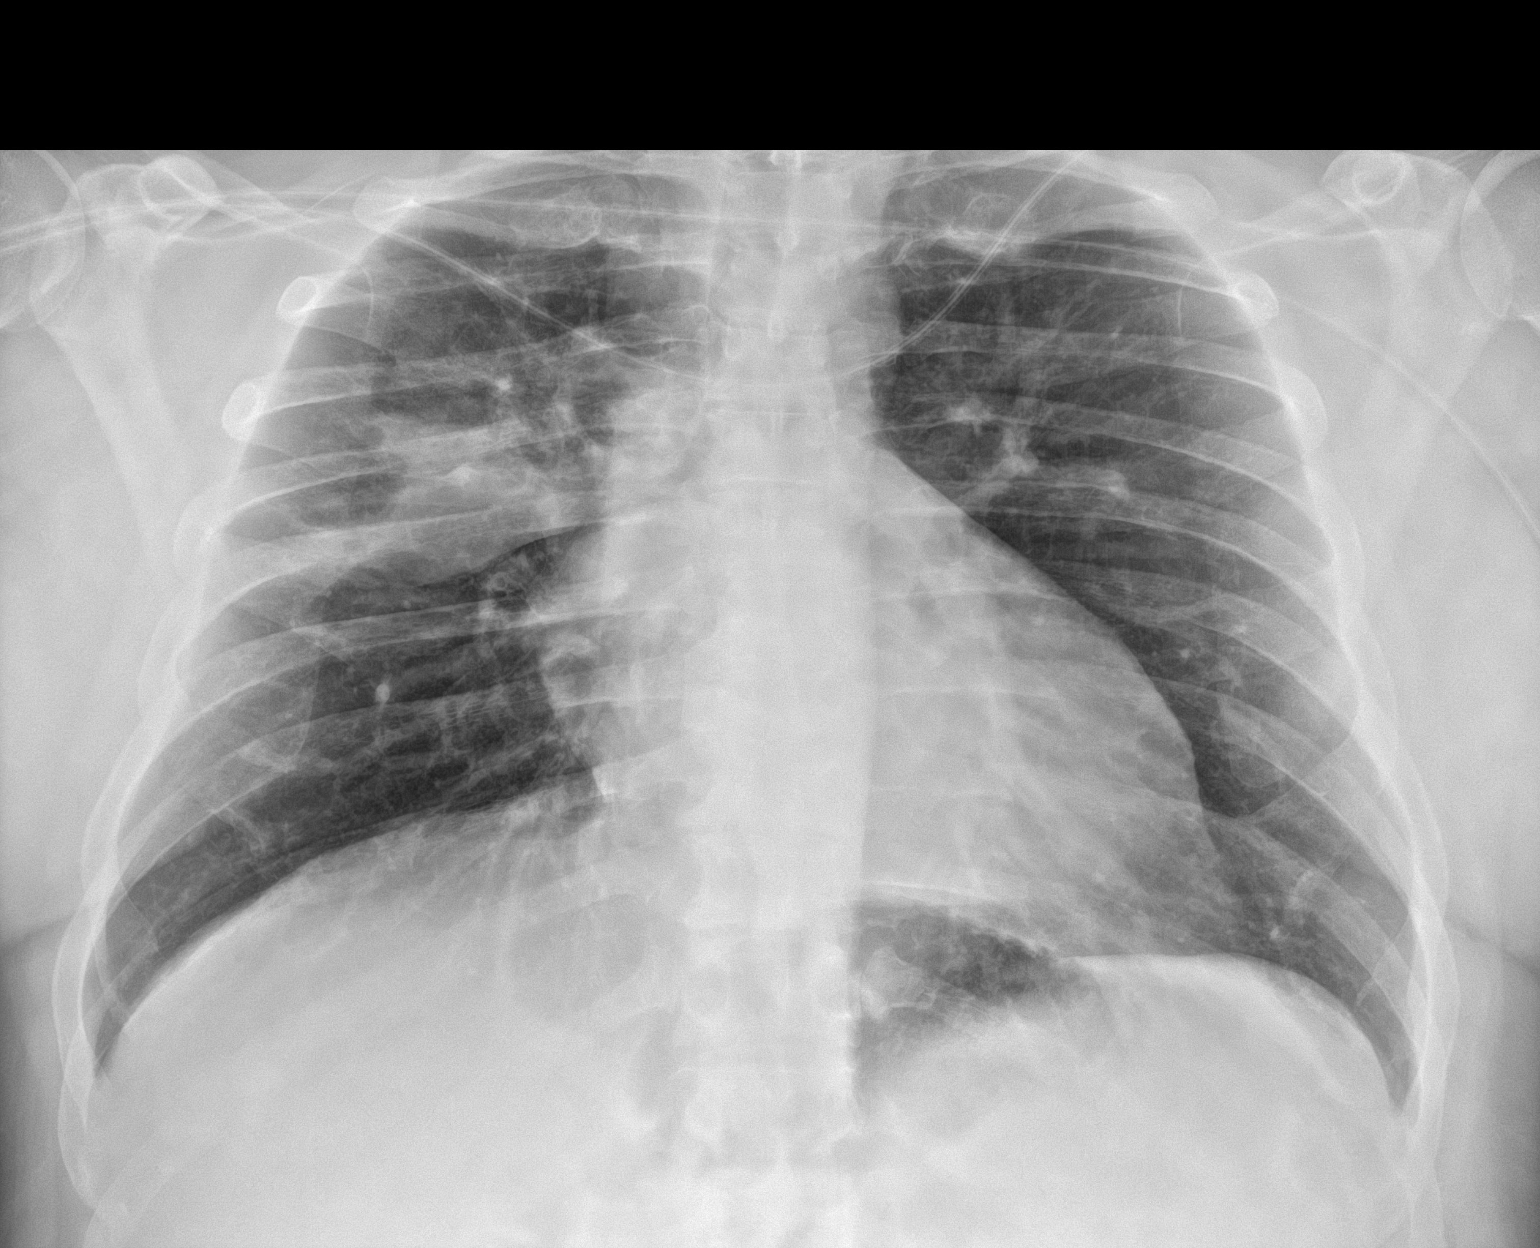

[1 of 1 positions shown; findings below may reference images not displayed]

FINDINGS: Single frontal view of the chest demonstrates stable cardiac
silhouette. Persistent right upper lobe mass, abutting the fissures,
with increased peripheral consolidation likely reflecting post
biopsy changes. No evidence of pneumothorax. No pleural effusion. No
acute bony abnormalities.
IMPRESSION: 1. Expected postprocedural changes after right upper lobe mass
biopsy. No evidence of pneumothorax.

## 2022-09-30 DIAGNOSIS — G4733 Obstructive sleep apnea (adult) (pediatric): Secondary | ICD-10-CM | POA: Diagnosis not present

## 2022-10-04 ENCOUNTER — Ambulatory Visit: Payer: BC Managed Care – PPO | Admitting: Nurse Practitioner

## 2022-10-20 IMAGING — DX DG CHEST 1V PORT
1 series · 1 of 1 positions shown · non-contrast
Comparison: Chest x-ray 03/24/2020

CLINICAL DATA: Status post right upper lobectomy

EXAM:
PORTABLE CHEST 1 VIEW

[chest ap]
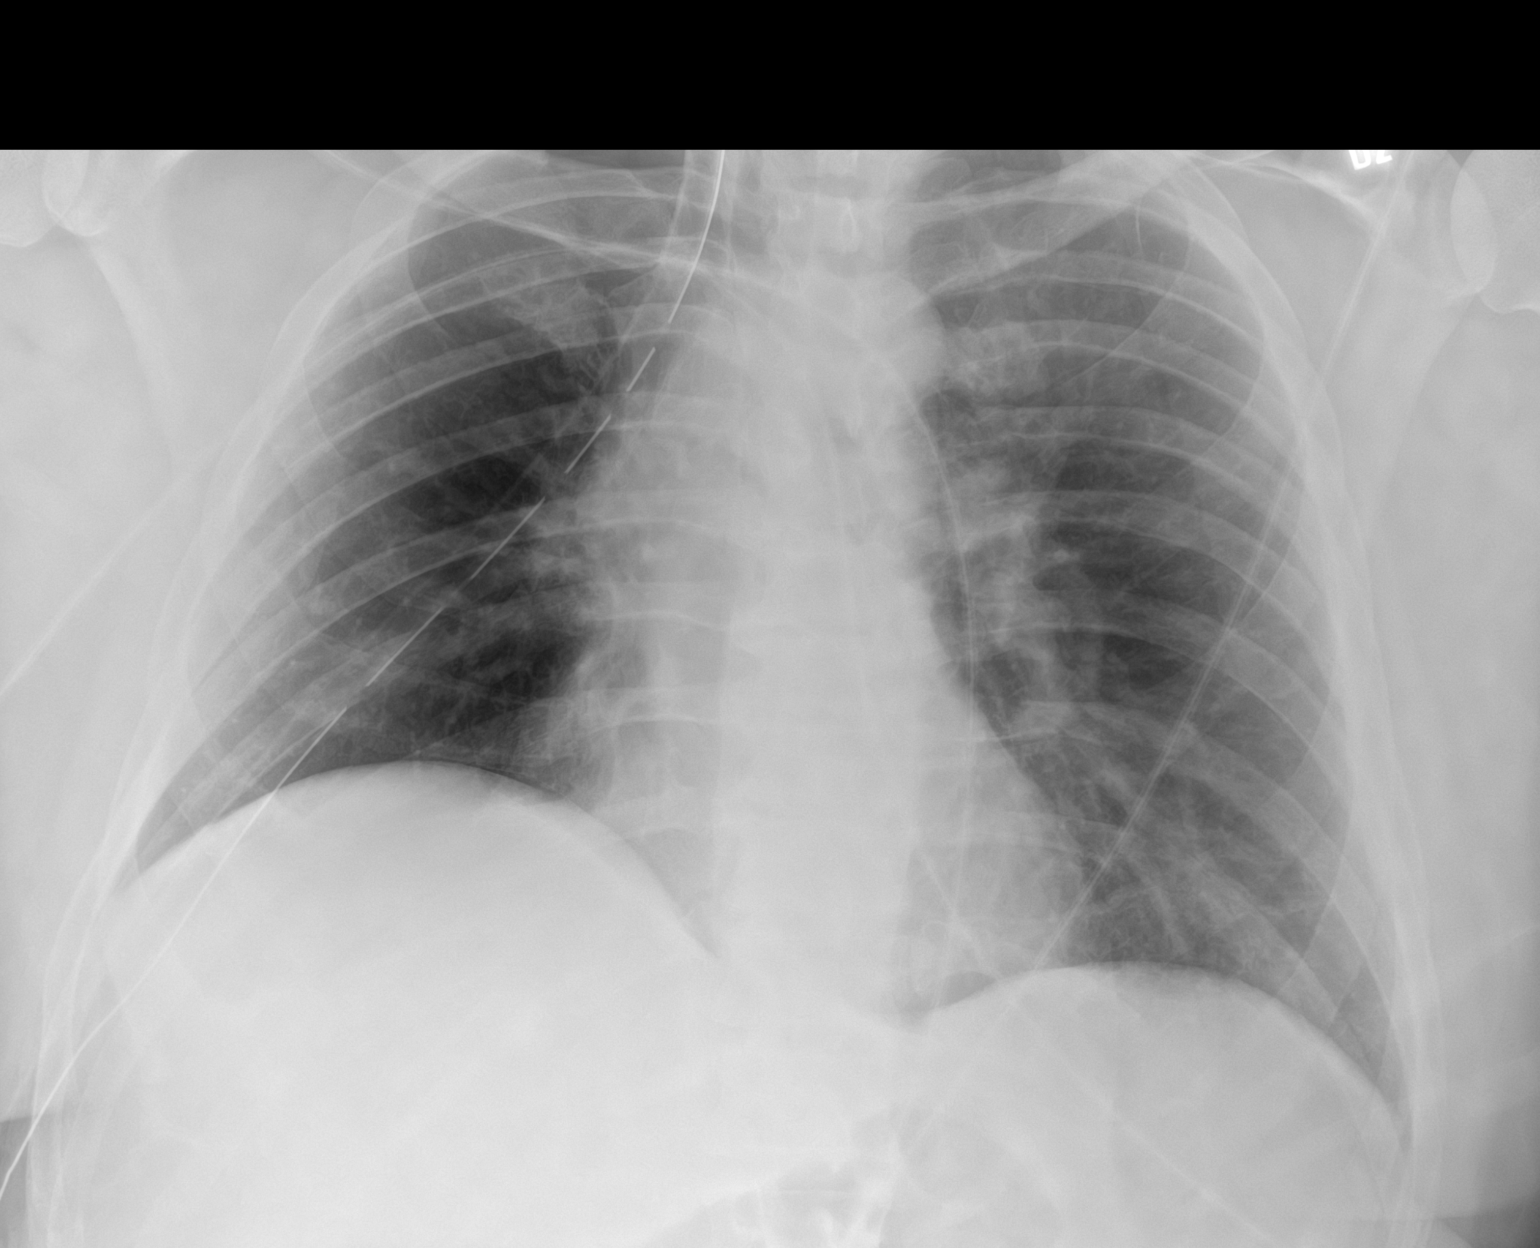

[1 of 1 positions shown; findings below may reference images not displayed]

FINDINGS: The heart size and mediastinal contours are unchanged.

No focal consolidation. No pulmonary edema. No pleural effusion.
Trace to small volume right apical pneumothorax with a right chest
tube in appropriate position in the postsurgical setting status post
right upper lobectomy.

No acute osseous abnormality.
IMPRESSION: Trace to small volume right apical pneumothorax with a right chest
tube in appropriate position in the postsurgical setting status post
right upper lobectomy.

These results will be called to the ordering clinician or
representative by the Radiologist Assistant, and communication
documented in the PACS or [REDACTED].

## 2022-10-23 IMAGING — DX DG CHEST 2V
2 series · 2 of 2 positions shown · non-contrast
Comparison: Earlier study of 5345 hours

CLINICAL DATA: Right chest tube removed

EXAM:
CHEST - 2 VIEW

[chest pa]
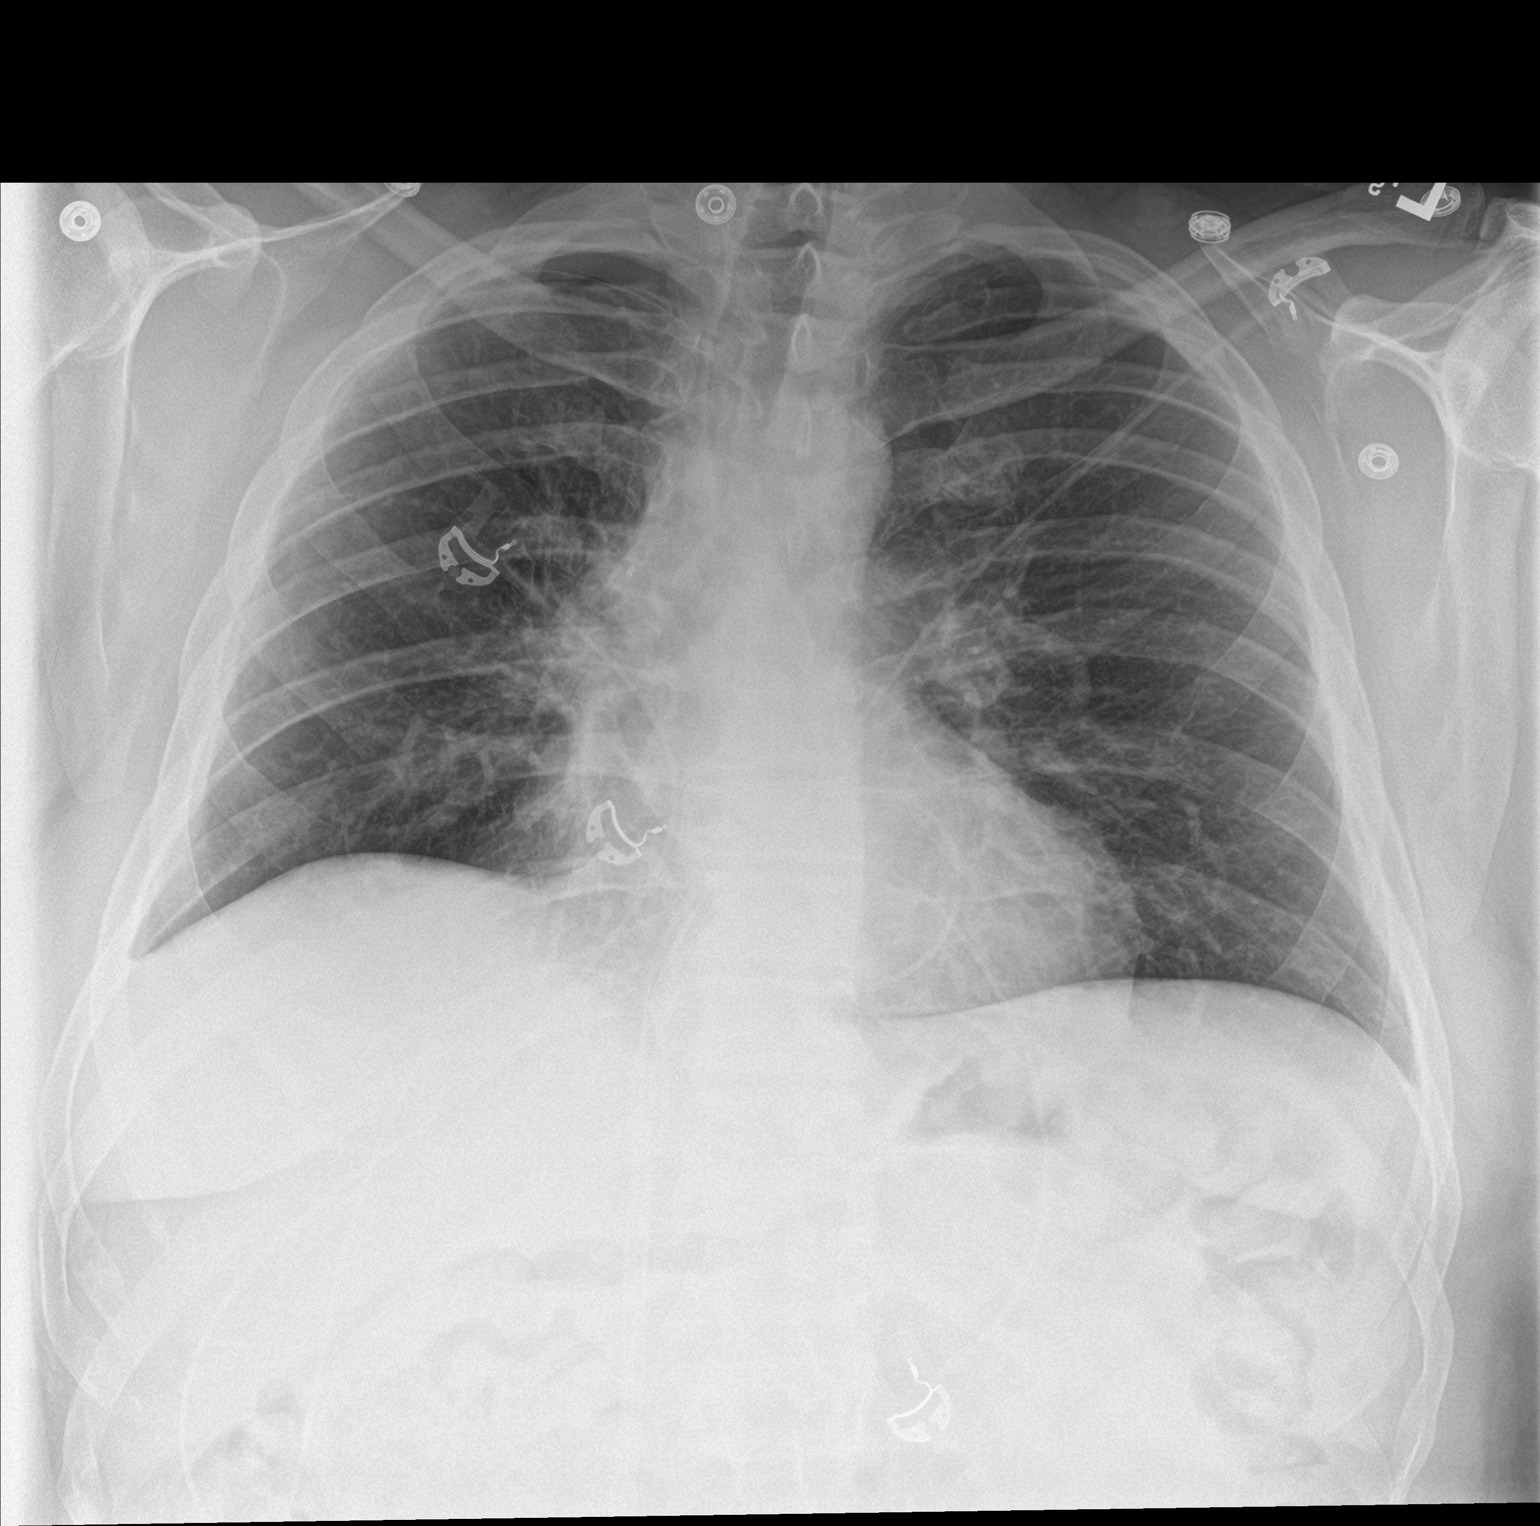

[chest lat]
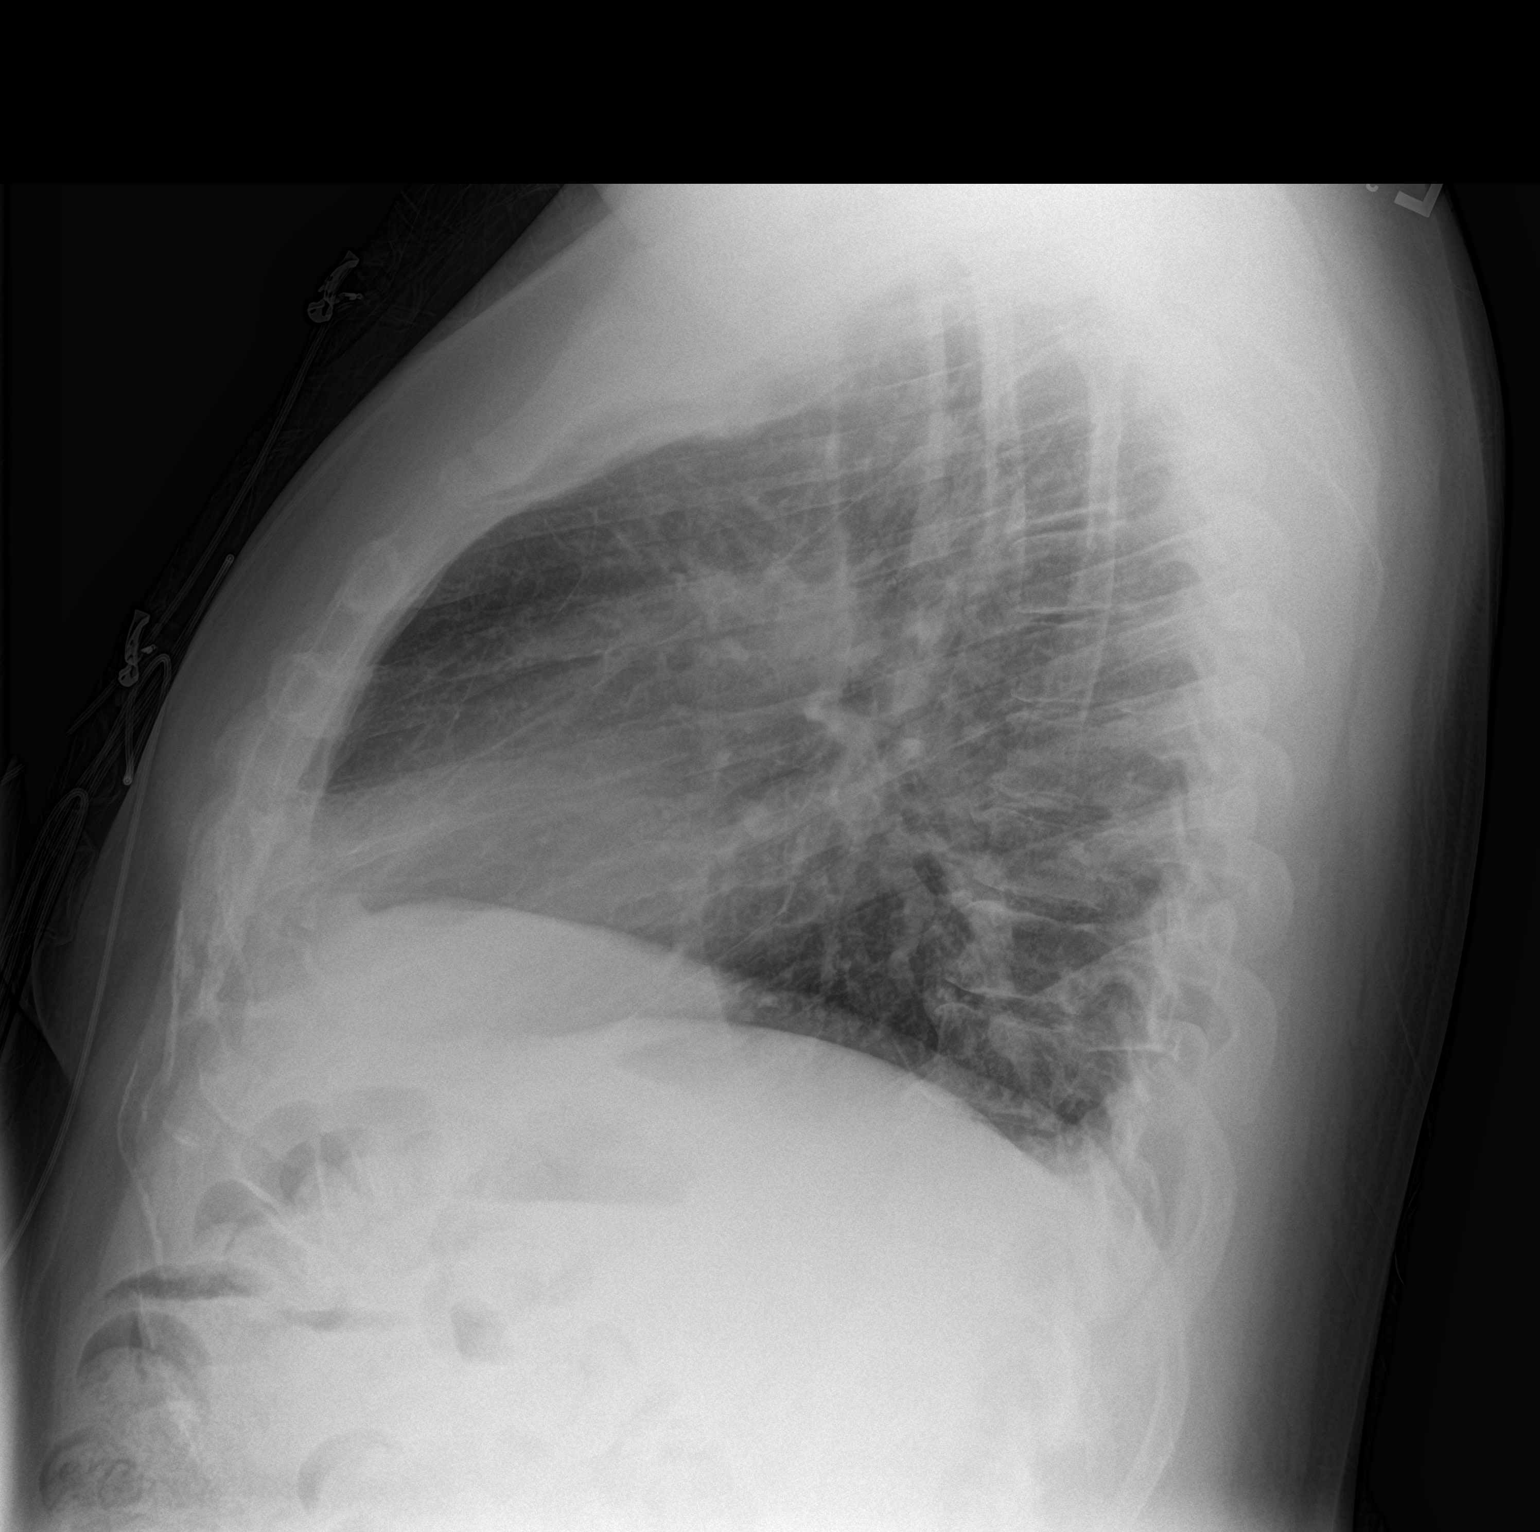

[2 of 2 positions shown; findings below may reference images not displayed]

FINDINGS: Interval removal of RIGHT thoracostomy tube.

Small RIGHT apical pneumothorax unchanged.

Normal heart size, mediastinal contours, and pulmonary vascularity.

Central peribronchial thickening.

No pulmonary infiltrate, pleural effusion, or LEFT pneumothorax.

Osseous structures unremarkable.
IMPRESSION: Persistent small RIGHT apical pneumothorax following chest tube
removal.

Bronchitic changes without infiltrate.

## 2022-10-25 ENCOUNTER — Encounter: Payer: Self-pay | Admitting: Neurology

## 2022-10-25 ENCOUNTER — Ambulatory Visit: Payer: BC Managed Care – PPO | Admitting: Neurology

## 2022-10-25 VITALS — BP 125/77 | HR 59 | Ht 71.0 in | Wt 274.0 lb

## 2022-10-25 DIAGNOSIS — G4733 Obstructive sleep apnea (adult) (pediatric): Secondary | ICD-10-CM

## 2022-10-25 NOTE — Patient Instructions (Addendum)
It was nice to see you again today.  Please try to be consistent with your CPAP machine.  Your apnea control is good on the current settings.  Your leak from the mask was acceptable.  Please follow-up routinely in this clinic in 1 year to see one of our nurse practitioners. You can talk to [facility] about a longer tubing for your AutoPap machine.  In addition, you may inquire about a mask that has the tubing attached on the top with this with a swivel ability.  You may be able to suspend the tubing from the top of your bed, not under the pillow.

## 2022-10-25 NOTE — Progress Notes (Signed)
Subjective:    Patient ID: Jerry [name] is a 47 y.o. male.  HPI    Interim history:   Jerry [name] is a 47 year old male with an underlying medical history of atopic dermatitis, lung cancer with status post right lobectomy, coronary artery disease, otalgia, aortic atherosclerosis, and severe obesity with a BMI of over 45, who presents for follow-up consultation of his obstructive sleep apnea after interim testing and starting home therapy.  The patient is unaccompanied today.  I first met him at the request of his primary care nurse practitioner on 03/15/2022, at which time he reported snoring and witnessed apneas as well as daytime somnolence.  There was advised to proceed with a sleep study.  He had a home sleep test through our office on 05/08/2022 which showed severe obstructive sleep apnea with an AHI of 50.3/h, O2 nadir 80% with time below or at 88% saturation of over 12 minutes, snoring was in the mild to moderate range.  He was advised to proceed with home AutoPap therapy.  His set up date was 05/30/2022.  He has a ResMed air sense 10 AutoSet machine.  His DME company is [facility].  He was first started on AutoPap of 7 to 14 cm.  In June 2024 he reported that the pressure felt too high.  He was compliant with his AutoPap in the first month.  I changed his pressure settings to a CPAP of 9 cm.  Today, 10/25/2022: I reviewed his CPAP compliance data from the last 30 days, from 09/24/2022 through 10/23/2022, during which time he used his machine 17 out of 30 days with percent use days greater than 4 hours at 40%, indicating suboptimal compliance, average usage of 5 hours and 2 minutes, residual AHI at goal at 1.2/h, leak on the higher side with the 95th percentile at 17.7 L/min with fluctuation, pressure at 9 cm with EPR of 2.  When he was on AutoPap therapy in the first month his AHI was elevated at 14.5/h.  His compliance was excellent with percent use days greater than 4 hours at 97% at the time.  He  reports overall doing well.  He does admit that sometimes he skips using it and has also had nights where he started off using his AutoPap but then he pulls it off in the middle of the night or later at night.  He endorses improvement when he is on it and that his daytime tiredness is better and his sleep quality is better.  His Epworth sleepiness score is 7 out of 24.  He uses a nasal pillows interface.  He is wondering if he can get a longer tube as his nightstand is on the left side but he likes to sleep on the right side.  He does notice a leak from the nasal pillows, sometimes the air blows up his face into the eyes even.  He is motivated to continue with treatment.  The patient's allergies, current medications, family history, past medical history, past social history, past surgical history and problem list were reviewed and updated as appropriate.   Previously:  03/15/2022: (He) reports snoring and excessive daytime somnolence as well as witnessed apneas, per GF/SO.  His Epworth sleepiness score is 7 out of 24, fatigue severity score is 41 out of 63.  He reports that he makes gasping sounds at night which are disturbing to his partner.  He is divorced twice, he has 2 children who will stay with him once a week.  He has  no pets in his household.  He denies recurrent nocturnal or morning headaches or night to night nocturia, he is not aware of any family history of sleep apnea.  Bedtime is generally around 11 and rise time around 6.  He admits to weight gain since his cancer diagnosis.  He has caffeine in the form of soda, he estimates about 100 mg of caffeine per day.  He does not typically drink tea or coffee.  He quit drinking alcohol in 2015 and admits to having a diagnosis of alcohol use disorder, he goes to AA.  He quit smoking in 2009 and no longer vapes.  I reviewed your office note from 02/05/2022.  He has a TV in his bedroom but generally turns it off at night.  He works as an Personnel officer.  His  Past Medical History Is Significant For: Past Medical History:  Diagnosis Date   Aortic atherosclerosis (HCC) 08/05/2020   per chest CT   CAD (coronary artery disease), native coronary artery 08/05/2020   Left anterior descending CAD noted on chest CT   Disorder due to vaping    Former smoker    Intractable nausea and vomiting 05/11/2020   lung ca 01/2020   Otitis media 07/03/2021   Pneumonia     His Past Surgical History Is Significant For: Past Surgical History:  Procedure Laterality Date   INTERCOSTAL NERVE BLOCK Right 03/28/2020   Procedure: INTERCOSTAL NERVE BLOCK;  Surgeon: Corliss Skains, MD;  Location: [facility] OR;  Service: Thoracic;  Laterality: Right;   LUNG LOBECTOMY Right 03/30/2020   LYMPH NODE DISSECTION Right 03/28/2020   Procedure: LYMPH NODE DISSECTION;  Surgeon: Corliss Skains, MD;  Location: [facility] OR;  Service: Thoracic;  Laterality: Right;   VIDEO BRONCHOSCOPY WITH ENDOBRONCHIAL NAVIGATION Right 03/02/2020   Procedure: VIDEO BRONCHOSCOPY WITH ENDOBRONCHIAL NAVIGATION;  Surgeon: Josephine Igo, DO;  Location: [facility] OR;  Service: Pulmonary;  Laterality: Right;   VIDEO BRONCHOSCOPY WITH ENDOBRONCHIAL ULTRASOUND Bilateral 03/02/2020   Procedure: VIDEO BRONCHOSCOPY WITH ENDOBRONCHIAL ULTRASOUND;  Surgeon: Josephine Igo, DO;  Location: [facility] OR;  Service: Pulmonary;  Laterality: Bilateral;   WISDOM TOOTH EXTRACTION      His Family History Is Significant For: Family History  Problem Relation Age of Onset   Stroke Father    Hypertension Father    Hyperlipidemia Father    Arrhythmia Father    Lung cancer Paternal Aunt    Sleep apnea Neg Hx     His Social History Is Significant For: Social History   Socioeconomic History   Marital status: Married    Spouse name: Not on file   Number of children: 6   Years of education: Not on file   Highest education level: Not on file  Occupational History   Occupation: Personnel officer  Tobacco Use   Smoking status: Former     Current packs/day: 0.00    Average packs/day: 1 pack/day for 17.0 years (17.0 ttl pk-yrs)    Types: Cigarettes    Start date: 03/01/1991    Quit date: 02/29/2008    Years since quitting: 14.6   Smokeless tobacco: Never  Vaping Use   Vaping status: Former   Start date: 02/08/2013   Quit date: 02/29/2020   Substances: Nicotine  Substance and Sexual Activity   Alcohol use: Not Currently    Comment: 7 years sober.   Drug use: Never   Sexual activity: Yes    Partners: Female  Other Topics Concern   Not on file  Social History Narrative   Not on file   Social Determinants of Health   Financial Resource Strain: Low Risk  (09/01/2021)   Overall Financial Resource Strain (CARDIA)    Difficulty of Paying Living Expenses: Not hard at all  Food Insecurity: No Food Insecurity (09/01/2021)   Hunger Vital Sign    Worried About Running Out of Food in the Last Year: Never true    Ran Out of Food in the Last Year: Never true  Transportation Needs: No Transportation Needs (09/01/2021)   PRAPARE - Administrator, Civil Service (Medical): No    Lack of Transportation (Non-Medical): No  Physical Activity: Inactive (09/01/2021)   Exercise Vital Sign    Days of Exercise per Week: 0 days    Minutes of Exercise per Session: 0 min  Stress: No Stress Concern Present (09/01/2021)   [employer] of Occupational Health - Occupational Stress Questionnaire    Feeling of Stress : Not at all  Social Connections: Moderately Integrated (09/01/2021)   Social Connection and Isolation Panel [NHANES]    Frequency of Communication with Friends and Family: More than three times a week    Frequency of Social Gatherings with Friends and Family: More than three times a week    Attends Religious Services: More than 4 times per year    Active Member of [employer] or Organizations: No    Attends Banker Meetings: Never    Marital Status: Married    His Allergies Are:  Allergies  Allergen  Reactions   Olanzapine Other (See Comments)    "Nightmares, night sweats"  :   His Current Medications Are:  Outpatient Encounter Medications as of 10/25/2022  Medication Sig   desonide (DESOWEN) 0.05 % cream Apply topically 2 (two) times daily. Apply to eyelid twice daily (Patient taking differently: Apply topically as needed. Apply to eyelid twice daily)   triamcinolone cream (KENALOG) 0.1 % Apply 1 application. topically 2 (two) times daily. (Patient taking differently: Apply 1 application  topically as needed.)   Facility-Administered Encounter Medications as of 10/25/2022  Medication   0.9 %  sodium chloride infusion   0.9 %  sodium chloride infusion   ondansetron (ZOFRAN) injection 8 mg  :  Review of Systems:  Out of a complete 14 point review of systems, all are reviewed and negative with the exception of these symptoms as listed below:  Review of Systems  Neurological:        Pt here for CPAP f/u Pt states no questions or concerns for today's visit    ESS:7     Objective:  Neurological Exam  Physical Exam Physical Examination:   Vitals:   10/25/22 0727  BP: 125/77  Pulse: (!) 59    General Examination: The patient is a very pleasant 47 y.o. male in no acute distress. He appears well-developed and well-nourished and well groomed.   HEENT: Normocephalic, atraumatic, pupils are equal, round and reactive to light, extraocular tracking is good without limitation to gaze excursion or nystagmus noted. Hearing is grossly intact. Face is symmetric with normal facial animation. Speech is clear with no dysarthria noted. There is no hypophonia. There is no lip, neck/head, jaw or voice tremor. Neck is supple with full range of passive and active motion. There are no carotid bruits on auscultation. Oropharynx exam reveals: mild mouth dryness, adequate dental hygiene and moderate airway crowding. Mallampati class II. Tongue protrudes centrally and palate elevates symmetrically.    Chest: Clear to auscultation  without wheezing, rhonchi or crackles noted.   Heart: S1+S2+0, regular and normal without murmurs, rubs or gallops noted.    Abdomen: Soft, non-tender and non-distended.   Extremities: There is no pitting edema in the distal lower extremities bilaterally.    Skin: Warm and dry without trophic changes noted.    Musculoskeletal: exam reveals no obvious joint deformities.    Neurologically:  Mental status: The patient is awake, alert and oriented in all 4 spheres. His immediate and remote memory, attention, language skills and fund of knowledge are appropriate. There is no evidence of aphasia, agnosia, apraxia or anomia. Speech is clear with normal prosody and enunciation. Thought process is linear. Mood is normal and affect is normal.  Cranial nerves II - XII are as described above under HEENT exam.  Motor exam: Normal bulk, strength and tone is noted. There is no obvious action or resting tremor.  Fine motor skills and coordination: grossly intact.  Cerebellar testing: No dysmetria or intention tremor. There is no truncal or gait ataxia.  Sensory exam: intact to light touch in the upper and lower extremities.  Gait, station and balance: He stands easily. No veering to one side is noted. No leaning to one side is noted. Posture is age-appropriate and stance is narrow based. Gait shows normal stride length and normal pace. No problems turning are noted.    Assessment and Plan:  In summary, Jerry [name] is a 47 year old male with an underlying medical history of atopic dermatitis, lung cancer with status post right lobectomy, coronary artery disease, otalgia, aortic atherosclerosis, and severe obesity with a BMI of over 45, who presents for follow-up consultation of his obstructive sleep apnea after interim testing and starting home therapy.  He had a home sleep test through our office on 05/08/2022 which showed severe obstructive sleep apnea with an AHI of 50.3/h, O2  nadir 80% with time below or at 88% saturation of over 12 minutes, snoring was in the mild to moderate range.  He has been on PAP therapy since 05/30/2022.  He has a ResMed air sense 10 AutoSet machine.  His DME company is [facility].  He was initially started on AutoPap of 7 to 14 cm, but we have changed his pressure setting to CPAP of 9 in June 2024.  His apnea control is very good, his compliance has declined.  He is advised of his test results and we reviewed his compliance data in detail, comparing first month and latest month as well.  He is strongly encouraged to be consistent with his CPAP and try to make enough time for sleep as well, 7 to 8 hours are recommended typically.  He currently reports a bedtime of 11 PM to 1 AM range, rise time typically between 5:30 AM and 6 AM.  He is motivated to continue with treatment, he is advised to get in touch with his DME provider to see if there could be a longer tubing he can have.  He is advised to follow-up routinely in this clinic in 1 year to see one of our nurse practitioners, we do not need to make any changes in his pressure settings.  Apnea control is good and leak from the mask acceptable with fluctuation.  I answered all his questions today and he was in agreement.

## 2022-10-30 DIAGNOSIS — G4733 Obstructive sleep apnea (adult) (pediatric): Secondary | ICD-10-CM | POA: Diagnosis not present

## 2022-11-30 DIAGNOSIS — G4733 Obstructive sleep apnea (adult) (pediatric): Secondary | ICD-10-CM | POA: Diagnosis not present

## 2022-12-05 IMAGING — DX DG CHEST 2V
2 series · 2 of 2 positions shown · non-contrast
Comparison: 03/03/2020

CLINICAL DATA: Adenocarcinoma right lung

EXAM:
CHEST - 2 VIEW

[dg chest 2 view (1 of 2)]
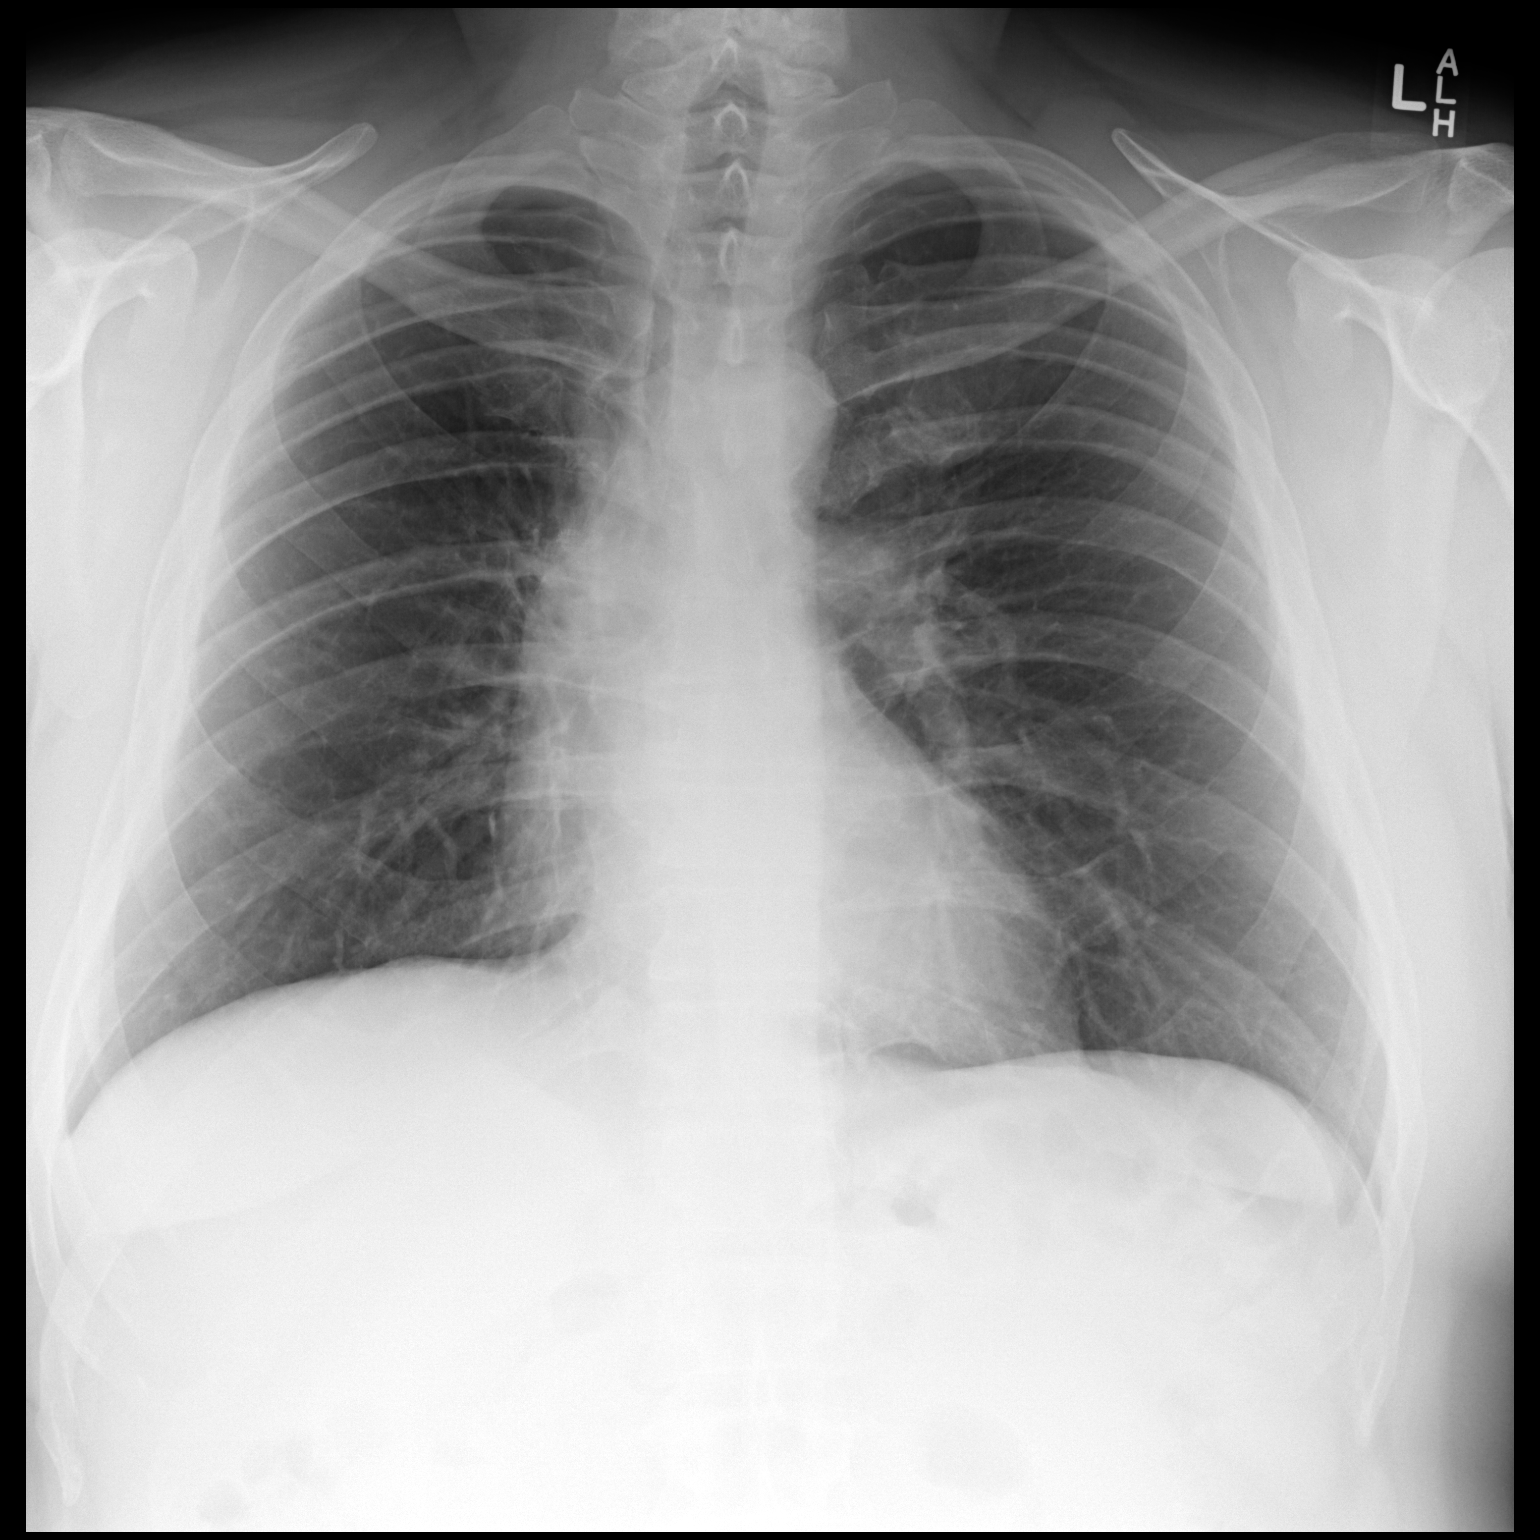

[dg chest 2 view (2 of 2)]
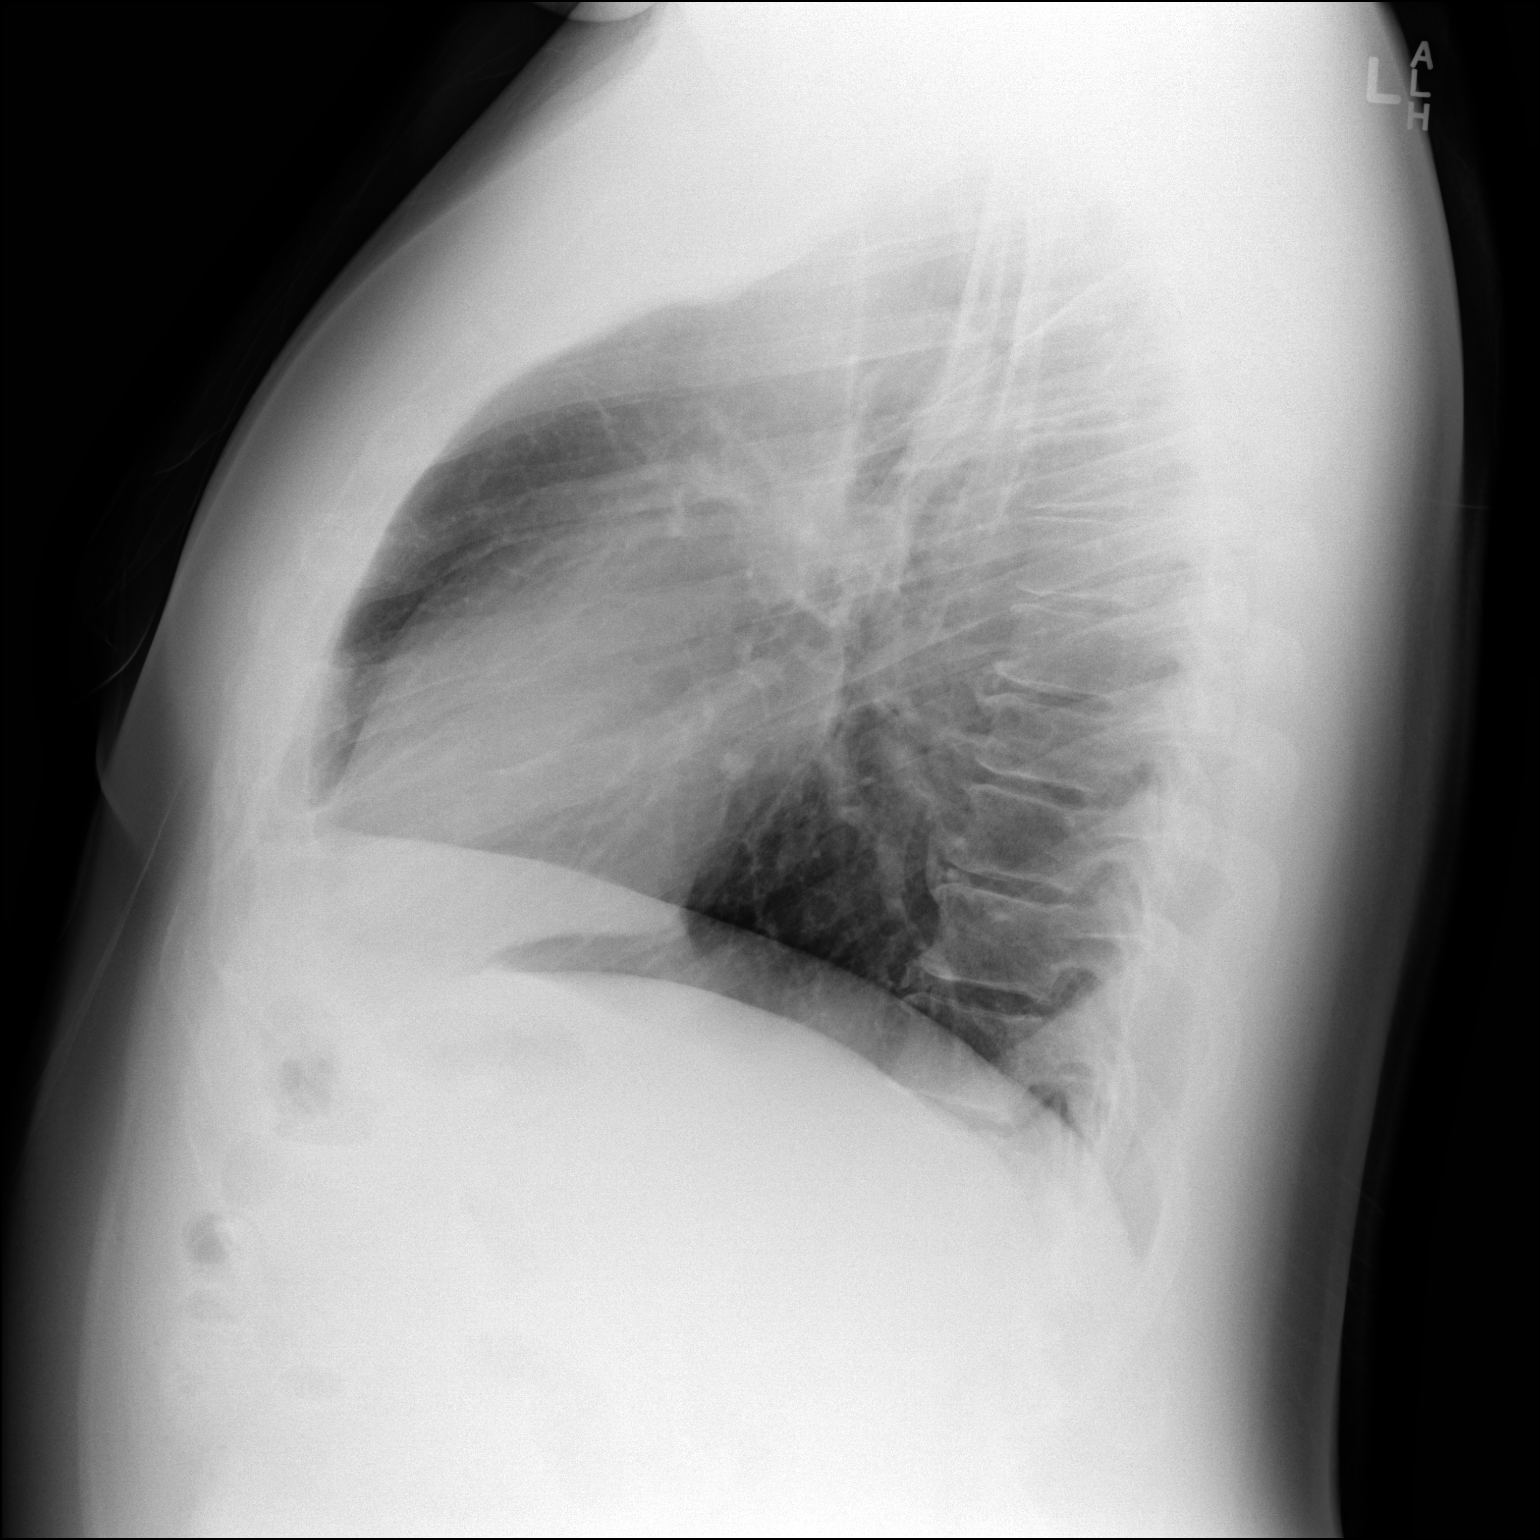

[2 of 2 positions shown; findings below may reference images not displayed]

FINDINGS: The heart size and mediastinal contours are within normal limits.
Both lungs are clear. The visualized skeletal structures are
unremarkable.

Surgical staples in the right hilar region.
IMPRESSION: No active cardiopulmonary disease.

## 2022-12-18 ENCOUNTER — Other Ambulatory Visit: Payer: BC Managed Care – PPO

## 2022-12-20 ENCOUNTER — Ambulatory Visit: Payer: BC Managed Care – PPO | Admitting: Internal Medicine

## 2022-12-21 ENCOUNTER — Inpatient Hospital Stay: Payer: BC Managed Care – PPO | Attending: Internal Medicine

## 2022-12-27 ENCOUNTER — Inpatient Hospital Stay: Payer: BC Managed Care – PPO | Admitting: Internal Medicine

## 2022-12-30 DIAGNOSIS — G4733 Obstructive sleep apnea (adult) (pediatric): Secondary | ICD-10-CM | POA: Diagnosis not present

## 2023-01-18 ENCOUNTER — Telehealth: Payer: Self-pay | Admitting: Internal Medicine

## 2023-01-22 ENCOUNTER — Ambulatory Visit (HOSPITAL_COMMUNITY)
Admission: RE | Admit: 2023-01-22 | Discharge: 2023-01-22 | Disposition: A | Discharge status: Home / Self Care | Payer: BC Managed Care – PPO | Source: Ambulatory Visit | Attending: Internal Medicine | Admitting: Internal Medicine

## 2023-01-22 ENCOUNTER — Inpatient Hospital Stay: Payer: BC Managed Care – PPO | Attending: Internal Medicine

## 2023-01-22 ENCOUNTER — Other Ambulatory Visit: Payer: Self-pay

## 2023-01-22 DIAGNOSIS — C3411 Malignant neoplasm of upper lobe, right bronchus or lung: Secondary | ICD-10-CM | POA: Insufficient documentation

## 2023-01-22 DIAGNOSIS — J439 Emphysema, unspecified: Secondary | ICD-10-CM | POA: Diagnosis not present

## 2023-01-22 DIAGNOSIS — C3490 Malignant neoplasm of unspecified part of unspecified bronchus or lung: Secondary | ICD-10-CM | POA: Insufficient documentation

## 2023-01-22 DIAGNOSIS — Z9221 Personal history of antineoplastic chemotherapy: Secondary | ICD-10-CM | POA: Diagnosis not present

## 2023-01-22 DIAGNOSIS — Z902 Acquired absence of lung [part of]: Secondary | ICD-10-CM | POA: Diagnosis not present

## 2023-01-22 DIAGNOSIS — C342 Malignant neoplasm of middle lobe, bronchus or lung: Secondary | ICD-10-CM | POA: Diagnosis not present

## 2023-01-22 LAB — CMP (CANCER CENTER ONLY)
ALT: 15 U/L (ref 0–44)
AST: 16 U/L (ref 15–41)
Albumin: 4.7 g/dL (ref 3.5–5.0)
Alkaline Phosphatase: 46 U/L (ref 38–126)
Anion gap: 8 (ref 5–15)
BUN: 17 mg/dL (ref 6–20)
CO2: 29 mmol/L (ref 22–32)
Calcium: 9.8 mg/dL (ref 8.9–10.3)
Chloride: 103 mmol/L (ref 98–111)
Creatinine: 0.95 mg/dL (ref 0.61–1.24)
GFR, Estimated: >60 mL/min (ref >60)
Glucose, Bld: 89 mg/dL (ref 70–99)
Potassium: 4.3 mmol/L (ref 3.5–5.1)
Sodium: 140 mmol/L (ref 135–145)
Total Bilirubin: 0.5 mg/dL (ref 0.0–1.2)
Total Protein: 7.4 g/dL (ref 6.5–8.1)

## 2023-01-22 LAB — CBC WITH DIFFERENTIAL (CANCER CENTER ONLY)
Abs Immature Granulocytes: 0.03 10*3/uL (ref 0.00–0.07)
Basophils Absolute: 0.1 10*3/uL (ref 0.0–0.1)
Basophils Relative: 1 %
Eosinophils Absolute: 0 10*3/uL (ref 0.0–0.5)
Eosinophils Relative: 1 %
HCT: 45.6 % (ref 39.0–52.0)
Hemoglobin: 15.4 g/dL (ref 13.0–17.0)
Immature Granulocytes: 0 %
Lymphocytes Relative: 25 %
Lymphs Abs: 1.8 10*3/uL (ref 0.7–4.0)
MCH: 31.9 pg (ref 26.0–34.0)
MCHC: 33.8 g/dL (ref 30.0–36.0)
MCV: 94.4 fL (ref 80.0–100.0)
Monocytes Absolute: 0.7 10*3/uL (ref 0.1–1.0)
Monocytes Relative: 9 %
Neutro Abs: 4.8 10*3/uL (ref 1.7–7.7)
Neutrophils Relative %: 64 %
Platelet Count: 208 10*3/uL (ref 150–400)
RBC: 4.83 MIL/uL (ref 4.22–5.81)
RDW: 12.7 % (ref 11.5–15.5)
WBC Count: 7.4 10*3/uL (ref 4.0–10.5)
nRBC: 0 % (ref 0.0–0.2)

## 2023-01-22 MED ORDER — IOHEXOL 300 MG/ML  SOLN
75.0000 mL | Freq: Once | INTRAMUSCULAR | Status: AC | PRN
  Administered 2023-01-22: 75 mL via INTRAVENOUS

## 2023-01-27 NOTE — Progress Notes (Unsigned)
[facility] OFFICE PROGRESS NOTE  Lurline Del, FNP (Inactive) No address on file  DIAGNOSIS: Stage IIA (T2b, N0, M0) non-small cell lung cancer, adenocarcinoma presented with right upper lobe lung mass diagnosed in February 2022.   Biomarker Findings Microsatellite status - MS-Stable Tumor Mutational Burden - 4 Muts/Mb Genomic Findings For a complete list of the genes assayed, please refer to the Appendix. KRAS G12D KEAP1 P278S 7 Disease relevant genes with no reportable alterations: ALK, BRAF, EGFR, ERBB2, MET, RET, ROS1   PDL1 Expression 5%.  PRIOR THERAPY: 1) Status post robotic assisted right video thoracoscopy with right upper lobectomy and mediastinal lymph node sampling under the care of Dr. Cliffton Asters on March 28, 2020. 2) Adjuvant systemic chemotherapy with cisplatin 75 Mg/M2 and Alimta 500 Mg/M2 every 3 weeks.  First dose May 05, 2020.  Status post 4 cycles.  Last dose was giving July 07, 2020.  Cisplatin was discontinued after cycle #2 secondary to uncontrolled nausea and vomiting and it was replaced with carboplatin for the last 2 cycles.  CURRENT THERAPY: Observation.   INTERVAL HISTORY: Jerry [name] 48 y.o. male returns to the clinic today for a follow up visit. The patient is currently on observation. He was diagnosed with stage II  lung cancer in 2022 and underwent surgery followed by adjuvant chemotherapy. The patient is feeling well today without any concerning complaints.  He has lost some weight in the interval due to dietary modifications.  Denies any fever, chills, or night sweats.  He states his breathing is "okay".  He may have mild dyspnea on exertion.  He may have mild cough but nothing significant.  He denies any recent URIs. Sometimes he continues to have surgical pain along the right ribs especially if reaching a certain way. Denies any chest pain or hemoptysis. Denies any nausea, vomiting, diarrhea, or constipation. Denies any headache or visual  changes. He recently had a restaging CT scan performed. The patient is here today for evaluation and to review his scan results.    MEDICAL HISTORY: Past Medical History:  Diagnosis Date   Aortic atherosclerosis (HCC) 08/05/2020   per chest CT   CAD (coronary artery disease), native coronary artery 08/05/2020   Left anterior descending CAD noted on chest CT   Disorder due to vaping    Former smoker    Intractable nausea and vomiting 05/11/2020   lung ca 01/2020   Otitis media 07/03/2021   Pneumonia     ALLERGIES:  is allergic to olanzapine.  MEDICATIONS:  Current Outpatient Medications  Medication Sig Dispense Refill   desonide (DESOWEN) 0.05 % cream Apply topically 2 (two) times daily. Apply to eyelid twice daily (Patient taking differently: Apply topically as needed. Apply to eyelid twice daily) 30 g 0   triamcinolone cream (KENALOG) 0.1 % Apply 1 application. topically 2 (two) times daily. (Patient taking differently: Apply 1 application  topically as needed.) 30 g 0   No current facility-administered medications for this visit.   Facility-Administered Medications Ordered in Other Visits  Medication Dose Route Frequency Provider Last Rate Last Admin   0.9 %  sodium chloride infusion   Intravenous Continuous Si Gaul, MD       0.9 %  sodium chloride infusion   Intravenous Continuous Si Gaul, MD 900 mL/hr at 05/11/20 0940 New Bag at 05/11/20 0940   ondansetron (ZOFRAN) injection 8 mg  8 mg Intravenous Once Charma Igo, RN        SURGICAL HISTORY:  Past  Surgical History:  Procedure Laterality Date   INTERCOSTAL NERVE BLOCK Right 03/28/2020   Procedure: INTERCOSTAL NERVE BLOCK;  Surgeon: Corliss Skains, MD;  Location: [facility] OR;  Service: Thoracic;  Laterality: Right;   LUNG LOBECTOMY Right 03/30/2020   LYMPH NODE DISSECTION Right 03/28/2020   Procedure: LYMPH NODE DISSECTION;  Surgeon: Corliss Skains, MD;  Location: [facility] OR;  Service: Thoracic;   Laterality: Right;   VIDEO BRONCHOSCOPY WITH ENDOBRONCHIAL NAVIGATION Right 03/02/2020   Procedure: VIDEO BRONCHOSCOPY WITH ENDOBRONCHIAL NAVIGATION;  Surgeon: Josephine Igo, DO;  Location: [facility] OR;  Service: Pulmonary;  Laterality: Right;   VIDEO BRONCHOSCOPY WITH ENDOBRONCHIAL ULTRASOUND Bilateral 03/02/2020   Procedure: VIDEO BRONCHOSCOPY WITH ENDOBRONCHIAL ULTRASOUND;  Surgeon: Josephine Igo, DO;  Location: [facility] OR;  Service: Pulmonary;  Laterality: Bilateral;   WISDOM TOOTH EXTRACTION      REVIEW OF SYSTEMS:   Review of Systems  Constitutional: Negative for appetite change, chills, fatigue, fever and unexpected weight change.  HENT: Negative for mouth sores, nosebleeds, sore throat and trouble swallowing.   Eyes: Negative for eye problems and icterus.  Respiratory: Mild dyspnea on exertion and cough. Negative for hemoptysis and wheezing.   Cardiovascular: Negative for chest pain and leg swelling.  Gastrointestinal: Negative for abdominal pain, constipation, diarrhea, nausea and vomiting.  Genitourinary: Negative for bladder incontinence, difficulty urinating, dysuria, frequency and hematuria.   Musculoskeletal: Negative for back pain, gait problem, neck pain and neck stiffness.  Skin: Negative for itching and rash.  Neurological: Negative for dizziness, extremity weakness, gait problem, headaches, light-headedness and seizures.  Hematological: Negative for adenopathy. Does not bruise/bleed easily.  Psychiatric/Behavioral: Negative for confusion, depression and sleep disturbance. The patient is not nervous/anxious.     PHYSICAL EXAMINATION:  Blood pressure 129/76, pulse 69, temperature 97.8 F (36.6 C), temperature source Tympanic, resp. rate 18, weight 287 lb (130.2 kg), SpO2 98%.  ECOG PERFORMANCE STATUS: 1  Physical Exam  Constitutional: Oriented to person, place, and time and well-developed, well-nourished, and in no distress.  HENT:  Head: Normocephalic and atraumatic.   Mouth/Throat: Oropharynx is clear and moist. No oropharyngeal exudate.  Eyes: Conjunctivae are normal. Right eye exhibits no discharge. Left eye exhibits no discharge. No scleral icterus.  Neck: Normal range of motion. Neck supple.  Cardiovascular: Normal rate, regular rhythm, normal heart sounds and intact distal pulses.   Pulmonary/Chest: Effort normal and breath sounds normal. No respiratory distress. No wheezes. No rales.  Abdominal: Soft. Bowel sounds are normal. Exhibits no distension and no mass. There is no tenderness.  Musculoskeletal: Normal range of motion. Exhibits no edema.  Lymphadenopathy:    No cervical adenopathy.  Neurological: Alert and oriented to person, place, and time. Exhibits normal muscle tone. Gait normal. Coordination normal.  Skin: Skin is warm and dry. No rash noted. Not diaphoretic. No erythema. No pallor.  Psychiatric: Mood, memory and judgment normal.  Vitals reviewed.  LABORATORY DATA: Lab Results  Component Value Date   WBC 7.4 01/22/2023   HGB 15.4 01/22/2023   HCT 45.6 01/22/2023   MCV 94.4 01/22/2023   PLT 208 01/22/2023      Chemistry      Component Value Date/Time   NA 140 01/22/2023 1555   NA 142 09/01/2021 0756   K 4.3 01/22/2023 1555   CL 103 01/22/2023 1555   CO2 29 01/22/2023 1555   BUN 17 01/22/2023 1555   BUN 20 09/01/2021 0756   CREATININE 0.95 01/22/2023 1555      Component Value Date/Time  CALCIUM 9.8 01/22/2023 1555   ALKPHOS 46 01/22/2023 1555   AST 16 01/22/2023 1555   ALT 15 01/22/2023 1555   BILITOT 0.5 01/22/2023 1555       RADIOGRAPHIC STUDIES:  CT Chest W Contrast Result Date: 01/30/2023 CLINICAL DATA:  Non-small cell lung cancer.  * Tracking Code: BO * EXAM: CT CHEST WITH CONTRAST TECHNIQUE: Multidetector CT imaging of the chest was performed during intravenous contrast administration. RADIATION DOSE REDUCTION: This exam was performed according to the departmental dose-optimization program which includes  automated exposure control, adjustment of the mA and/or kV according to patient size and/or use of iterative reconstruction technique. CONTRAST:  75mL OMNIPAQUE IOHEXOL 300 MG/ML  SOLN COMPARISON:  03/19/2022. FINDINGS: Cardiovascular: Age advanced left anterior descending and circumflex coronary artery calcification. Heart size normal. No pericardial effusion. Mediastinum/Nodes: No pathologically enlarged mediastinal, hilar or axillary lymph nodes. Esophagus is grossly unremarkable. Lungs/Pleura: Right upper lobectomy with additional postoperative scarring in the right lung. 6 mm right middle lobe nodule (5/91), enlarged from 3 mm on 03/19/2022. Mild paraseptal emphysema. No pleural fluid. Airway is unremarkable. Upper Abdomen: Visualized portions of the liver, gallbladder, adrenal glands, kidneys, spleen, pancreas, stomach and bowel are grossly unremarkable. No upper abdominal adenopathy. Musculoskeletal: Minimal degenerative change in the spine. No worrisome lytic or sclerotic lesions. IMPRESSION: 1. Enlarging 6 mm right middle lobe nodule, worrisome for disease recurrence. 2. Age advanced 2 vessel coronary artery calcification. 3.  Emphysema (ICD10-J43.9). Electronically Signed   By: Leanna Battles M.D.   On: 01/30/2023 16:32     ASSESSMENT/PLAN:  This is a very pleasant 48 year old Caucasian male recently diagnosed with stage IIA (T2b, N0, M0) non-small cell lung cancer, adenocarcinoma status post right upper lobectomy with lymph node sampling under the care of Dr. Cliffton Asters on March 28, 2020. The patient had molecular studies by foundation 1 that showed no actionable mutation and his PD-L1 expression was 5%. The patient underwent adjuvant systemic chemotherapy with cisplatin 75 Mg/M2 and Alimta 500 Mg/M2.  Status post 4 cycles.  Cisplatin was replaced with carboplatin starting from cycle #3.  The patient tolerated his treatment well except for significant nausea and vomiting during the cisplatin  treatment.  He has been on observation since 2022 and feeling well.   The patient was seen with Dr. Arbutus Ped today.  Dr. Arbutus Ped personally and independently reviewed the scan and discussed results with the patient today.  The scan showed a 6 mm right middle lobe nodule worrisome for disease recurrence. This was previously 3 mm in March 2024. However, this is too small for detection on PET scan.  Dr. Arbutus Ped recommends close monitoring with repeat CT scan in 4 months.   I will arrange for a repeat CT scan of the chest in 4 months and follow up 1 week later.   We also discussed his scan mention age advanced two-vessel coronary artery calcification.  The patient does not have a PCP.  Discussed the importance of having a PCP for health maintenance, screenings, and to discuss risk factor reduction for heart disease/heart attacks.   The patient was advised to call immediately if he has any concerning symptoms in the interval. The patient voices understanding of current disease status and treatment options and is in agreement with the current care plan. All questions were answered. The patient knows to call the clinic with any problems, questions or concerns. We can certainly see the patient much sooner if necessary   Orders Placed This Encounter  Procedures  CT Chest W Contrast    Standing Status:   Future    Expected Date:   05/15/2023    Expiration Date:   01/31/2024    If indicated for the ordered procedure, I authorize the administration of contrast media per Radiology protocol:   Yes    Does the patient have a contrast media/X-ray dye allergy?:   No    Preferred imaging location?:   [facility]      Ernestine Rohman L Candyce Gambino, PA-C 01/31/23  ADDENDUM: Hematology/oncology Attending: I had a face-to-face encounter with the patient today.  I reviewed his record, lab, scan and recommended his care plan.  This is a very pleasant 48 years old white male with history of stage IIa  non-small cell lung cancer, adenocarcinoma diagnosed in February 2022 with no actionable mutations and PD-L1 expression of 5%.  The patient status post right upper lobectomy with mediastinal lymph node dissection followed by adjuvant systemic chemotherapy for 4 cycles.  He has been on observation since June 2022. He had repeat CT scan of the chest performed recently.  I personally and independently reviewed the scan images and discussed the result with the patient today.  His scan showed enlarging 6 mm right middle lobe nodule worrisome for disease recurrence.  I discussed with the patient the scan result and recommended for him to have repeat CT scan of the chest in around 4 months for further evaluation of this nodule.  The size of the nodule is currently under the detection size criteria for a PET scan and also too small for biopsy. He was advised to call immediately if he has any other concerning symptoms in the interval. The total time spent in the appointment was 20 minutes. Disclaimer: This note was dictated with voice recognition software. Similar sounding words can inadvertently be transcribed and may be missed upon review. [name], MD

## 2023-01-30 DIAGNOSIS — G4733 Obstructive sleep apnea (adult) (pediatric): Secondary | ICD-10-CM | POA: Diagnosis not present

## 2023-01-31 ENCOUNTER — Inpatient Hospital Stay: Payer: BC Managed Care – PPO | Admitting: Physician Assistant

## 2023-01-31 VITALS — BP 129/76 | HR 69 | Temp 97.8°F | Resp 18 | Wt 287.0 lb

## 2023-01-31 DIAGNOSIS — C3411 Malignant neoplasm of upper lobe, right bronchus or lung: Secondary | ICD-10-CM | POA: Diagnosis not present

## 2023-01-31 DIAGNOSIS — Z902 Acquired absence of lung [part of]: Secondary | ICD-10-CM | POA: Diagnosis not present

## 2023-01-31 DIAGNOSIS — C3491 Malignant neoplasm of unspecified part of right bronchus or lung: Secondary | ICD-10-CM

## 2023-01-31 DIAGNOSIS — Z9221 Personal history of antineoplastic chemotherapy: Secondary | ICD-10-CM | POA: Diagnosis not present

## 2023-03-02 DIAGNOSIS — G4733 Obstructive sleep apnea (adult) (pediatric): Secondary | ICD-10-CM | POA: Diagnosis not present

## 2023-05-16 ENCOUNTER — Inpatient Hospital Stay: Payer: BC Managed Care – PPO | Attending: Physician Assistant

## 2023-05-16 ENCOUNTER — Ambulatory Visit (HOSPITAL_COMMUNITY)
Admission: RE | Admit: 2023-05-16 | Discharge: 2023-05-16 | Disposition: A | Discharge status: Home / Self Care | Source: Ambulatory Visit | Attending: Physician Assistant | Admitting: Physician Assistant

## 2023-05-16 DIAGNOSIS — C3491 Malignant neoplasm of unspecified part of right bronchus or lung: Secondary | ICD-10-CM

## 2023-05-16 DIAGNOSIS — I700 Atherosclerosis of aorta: Secondary | ICD-10-CM | POA: Insufficient documentation

## 2023-05-16 DIAGNOSIS — R059 Cough, unspecified: Secondary | ICD-10-CM | POA: Insufficient documentation

## 2023-05-16 DIAGNOSIS — C3411 Malignant neoplasm of upper lobe, right bronchus or lung: Secondary | ICD-10-CM | POA: Insufficient documentation

## 2023-05-16 DIAGNOSIS — G4730 Sleep apnea, unspecified: Secondary | ICD-10-CM | POA: Diagnosis not present

## 2023-05-16 DIAGNOSIS — I2510 Atherosclerotic heart disease of native coronary artery without angina pectoris: Secondary | ICD-10-CM | POA: Insufficient documentation

## 2023-05-16 DIAGNOSIS — C3490 Malignant neoplasm of unspecified part of unspecified bronchus or lung: Secondary | ICD-10-CM | POA: Diagnosis not present

## 2023-05-16 DIAGNOSIS — J439 Emphysema, unspecified: Secondary | ICD-10-CM | POA: Diagnosis not present

## 2023-05-16 DIAGNOSIS — Z902 Acquired absence of lung [part of]: Secondary | ICD-10-CM | POA: Diagnosis not present

## 2023-05-16 DIAGNOSIS — Z9221 Personal history of antineoplastic chemotherapy: Secondary | ICD-10-CM | POA: Insufficient documentation

## 2023-05-16 LAB — CBC WITH DIFFERENTIAL (CANCER CENTER ONLY)
Abs Immature Granulocytes: 0.02 10*3/uL (ref 0.00–0.07)
Basophils Absolute: 0 10*3/uL (ref 0.0–0.1)
Basophils Relative: 1 %
Eosinophils Absolute: 0.1 10*3/uL (ref 0.0–0.5)
Eosinophils Relative: 1 %
HCT: 44.5 % (ref 39.0–52.0)
Hemoglobin: 15.8 g/dL (ref 13.0–17.0)
Immature Granulocytes: 0 %
Lymphocytes Relative: 22 %
Lymphs Abs: 1.9 10*3/uL (ref 0.7–4.0)
MCH: 31.9 pg (ref 26.0–34.0)
MCHC: 35.5 g/dL (ref 30.0–36.0)
MCV: 89.7 fL (ref 80.0–100.0)
Monocytes Absolute: 0.8 10*3/uL (ref 0.1–1.0)
Monocytes Relative: 9 %
Neutro Abs: 6 10*3/uL (ref 1.7–7.7)
Neutrophils Relative %: 67 %
Platelet Count: 235 10*3/uL (ref 150–400)
RBC: 4.96 MIL/uL (ref 4.22–5.81)
RDW: 12.4 % (ref 11.5–15.5)
WBC Count: 8.8 10*3/uL (ref 4.0–10.5)
nRBC: 0 % (ref 0.0–0.2)

## 2023-05-16 LAB — CMP (CANCER CENTER ONLY)
ALT: 14 U/L (ref 0–44)
AST: 16 U/L (ref 15–41)
Albumin: 4.7 g/dL (ref 3.5–5.0)
Alkaline Phosphatase: 49 U/L (ref 38–126)
Anion gap: 6 (ref 5–15)
BUN: 19 mg/dL (ref 6–20)
CO2: 30 mmol/L (ref 22–32)
Calcium: 9.3 mg/dL (ref 8.9–10.3)
Chloride: 105 mmol/L (ref 98–111)
Creatinine: 1.04 mg/dL (ref 0.61–1.24)
GFR, Estimated: >60 mL/min (ref >60)
Glucose, Bld: 88 mg/dL (ref 70–99)
Potassium: 4.2 mmol/L (ref 3.5–5.1)
Sodium: 141 mmol/L (ref 135–145)
Total Bilirubin: 0.4 mg/dL (ref 0.0–1.2)
Total Protein: 7.3 g/dL (ref 6.5–8.1)

## 2023-05-16 MED ORDER — IOHEXOL 300 MG/ML  SOLN
75.0000 mL | Freq: Once | INTRAMUSCULAR | Status: AC | PRN
  Administered 2023-05-16: 75 mL via INTRAVENOUS

## 2023-05-22 ENCOUNTER — Other Ambulatory Visit: Payer: Self-pay | Admitting: Medical Oncology

## 2023-05-22 DIAGNOSIS — C3491 Malignant neoplasm of unspecified part of right bronchus or lung: Secondary | ICD-10-CM

## 2023-05-23 ENCOUNTER — Inpatient Hospital Stay: Payer: BC Managed Care – PPO | Admitting: Internal Medicine

## 2023-05-23 VITALS — BP 125/73 | HR 79 | Temp 97.2°F | Resp 17 | Ht 71.0 in | Wt 282.3 lb

## 2023-05-23 DIAGNOSIS — R059 Cough, unspecified: Secondary | ICD-10-CM | POA: Diagnosis not present

## 2023-05-23 DIAGNOSIS — C3411 Malignant neoplasm of upper lobe, right bronchus or lung: Secondary | ICD-10-CM | POA: Diagnosis not present

## 2023-05-23 DIAGNOSIS — Z902 Acquired absence of lung [part of]: Secondary | ICD-10-CM | POA: Diagnosis not present

## 2023-05-23 DIAGNOSIS — I2510 Atherosclerotic heart disease of native coronary artery without angina pectoris: Secondary | ICD-10-CM | POA: Diagnosis not present

## 2023-05-23 DIAGNOSIS — I700 Atherosclerosis of aorta: Secondary | ICD-10-CM | POA: Diagnosis not present

## 2023-05-23 DIAGNOSIS — C3490 Malignant neoplasm of unspecified part of unspecified bronchus or lung: Secondary | ICD-10-CM

## 2023-05-23 DIAGNOSIS — Z9221 Personal history of antineoplastic chemotherapy: Secondary | ICD-10-CM | POA: Diagnosis not present

## 2023-05-23 DIAGNOSIS — G4730 Sleep apnea, unspecified: Secondary | ICD-10-CM | POA: Diagnosis not present

## 2023-05-23 NOTE — Progress Notes (Signed)
 [facility] [phone]   [phone]  OFFICE PROGRESS NOTE  Practice, [facility] [address] [place] [address]  DIAGNOSIS: Stage IIA (T2b, N0, M0) non-small cell lung cancer, adenocarcinoma presented with right upper lobe lung mass diagnosed in February 2022.  Biomarker Findings Microsatellite status - MS-Stable Tumor Mutational Burden - 4 Muts/Mb Genomic Findings For a complete list of the genes assayed, please refer to the Appendix. KRAS G12D KEAP1 P278S 7 Disease relevant genes with no reportable alterations: ALK, BRAF, EGFR, ERBB2, MET, RET, ROS1  PDL1 Expression 5%.  PRIOR THERAPY:  1) Status post robotic assisted right video thoracoscopy with right upper lobectomy and mediastinal lymph node sampling under the care of Dr. Deloise Ferries on March 28, 2020. 2) Adjuvant systemic chemotherapy with cisplatin  75 Mg/M2 and Alimta  500 Mg/M2 every 3 weeks.  First dose May 05, 2020.  Status post 4 cycles.  Last dose was giving July 07, 2020.  Cisplatin  was discontinued after cycle #2 secondary to uncontrolled nausea and vomiting and it was replaced with carboplatin  for the last 2 cycles.  CURRENT THERAPY: Observation.  INTERVAL HISTORY: Jerry [name] 48 y.o. male returns to the clinic today for follow-up visit.  Discussed the use of AI scribe software for clinical note transcription with the patient, who gave verbal consent to proceed.  History of Present Illness   Jerry [name] is a 48 year old male with stage two A non-small cell lung cancer who presents for evaluation and repeat CT scan of the chest.  He was diagnosed with stage two A non-small cell lung cancer, adenocarcinoma, in February 2022. He underwent a right upper lobectomy with mediastinal lymph node sampling followed by adjuvant systemic chemotherapy with cisplatin  and Alimta  for four cycles. He has been on observation since completing chemotherapy.  He experiences  intermittent cramping pain on the right side, possibly due to scar tissue from previous surgery. About a month and a half ago, he moved in a way that seemed to relieve the pain, as he has not experienced it since.  No breathing issues, hemoptysis, or persistent nausea, vomiting, or diarrhea. He mentions not using his CPAP machine consistently for sleep apnea, which led to a gag reflex and vomiting this morning due to excessive snoring.  A nodule in the right lung was previously noted, and he recalls a previous report from January indicating the nodule was 6 mm, which he found confusing.       MEDICAL HISTORY: Past Medical History:  Diagnosis Date   Aortic atherosclerosis (HCC) 08/05/2020   per chest CT   CAD (coronary artery disease), native coronary artery 08/05/2020   Left anterior descending CAD noted on chest CT   Disorder due to vaping    Former smoker    Intractable nausea and vomiting 05/11/2020   lung ca 01/2020   Otitis media 07/03/2021   Pneumonia     ALLERGIES:  is allergic to olanzapine .  MEDICATIONS:  Current Outpatient Medications  Medication Sig Dispense Refill   desonide  (DESOWEN ) 0.05 % cream Apply topically 2 (two) times daily. Apply to eyelid twice daily (Patient taking differently: Apply topically as needed. Apply to eyelid twice daily) 30 g 0   triamcinolone  cream (KENALOG ) 0.1 % Apply 1 application. topically 2 (two) times daily. (Patient taking differently: Apply 1 application  topically as needed.) 30 g 0   No current facility-administered medications for this visit.   Facility-Administered Medications Ordered in Other Visits  Medication Dose Route Frequency Provider Last Rate Last Admin   0.9 %  sodium chloride  infusion   Intravenous Continuous Marlene Simas, MD       0.9 %  sodium chloride  infusion   Intravenous Continuous Marlene Simas, MD 900 mL/hr at 05/11/20 0940 New Bag at 05/11/20 0940   ondansetron  (ZOFRAN ) injection 8 mg  8 mg Intravenous  Once Jerry Butters, RN        SURGICAL HISTORY:  Past Surgical History:  Procedure Laterality Date   INTERCOSTAL NERVE BLOCK Right 03/28/2020   Procedure: INTERCOSTAL NERVE BLOCK;  Surgeon: Hilarie Lovely, MD;  Location[facility] OR;  Service: Thoracic;  Laterality: Right;   LUNG LOBECTOMY Right 03/30/2020   LYMPH NODE DISSECTION Right 03/28/2020   Procedure: LYMPH NODE DISSECTION;  Surgeon: Hilarie Lovely, MD;  Location: [facility] OR;  Service: Thoracic;  Laterality: Right;   VIDEO BRONCHOSCOPY WITH ENDOBRONCHIAL NAVIGATION Right 03/02/2020   Procedure: VIDEO BRONCHOSCOPY WITH ENDOBRONCHIAL NAVIGATION;  Surgeon: Prudy Brownie, DO;  Location: [facility] OR;  Service: Pulmonary;  Laterality: Right;   VIDEO BRONCHOSCOPY WITH ENDOBRONCHIAL ULTRASOUND Bilateral 03/02/2020   Procedure: VIDEO BRONCHOSCOPY WITH ENDOBRONCHIAL ULTRASOUND;  Surgeon: Prudy Brownie, DO;  Location: [facility] OR;  Service: Pulmonary;  Laterality: Bilateral;   WISDOM TOOTH EXTRACTION      REVIEW OF SYSTEMS:  Constitutional: negative Eyes: negative Ears, nose, mouth, throat, and face: negative Respiratory: positive for cough Cardiovascular: negative Gastrointestinal: negative Genitourinary:negative Integument/breast: negative Hematologic/lymphatic: negative Musculoskeletal:negative Neurological: negative Behavioral/Psych: negative Endocrine: negative Allergic/Immunologic: negative   PHYSICAL EXAMINATION: General appearance: alert, cooperative, and no distress Head: Normocephalic, without obvious abnormality, atraumatic Neck: no adenopathy, no JVD, supple, symmetrical, trachea midline, and thyroid not enlarged, symmetric, no tenderness/mass/nodules Lymph nodes: Cervical, supraclavicular, and axillary nodes normal. Resp: clear to auscultation bilaterally Back: symmetric, no curvature. ROM normal. No CVA tenderness. Cardio: regular rate and rhythm, S1, S2 normal, no murmur, click, rub or gallop GI: soft, non-tender; bowel  sounds normal; no masses,  no organomegaly Extremities: extremities normal, atraumatic, no cyanosis or edema Neurologic: Alert and oriented X 3, normal strength and tone. Normal symmetric reflexes. Normal coordination and gait  ECOG PERFORMANCE STATUS: 1 - Symptomatic but completely ambulatory  Blood pressure 125/73, pulse 79, temperature (!) 97.2 F (36.2 C), temperature source Temporal, resp. rate 17, height 5\' 11"  (1.803 m), weight 282 lb 4.8 oz (128.1 kg), SpO2 99%.  LABORATORY DATA: Lab Results  Component Value Date   WBC 8.8 05/16/2023   HGB 15.8 05/16/2023   HCT 44.5 05/16/2023   MCV 89.7 05/16/2023   PLT 235 05/16/2023      Chemistry      Component Value Date/Time   NA 141 05/16/2023 1542   NA 142 09/01/2021 0756   K 4.2 05/16/2023 1542   CL 105 05/16/2023 1542   CO2 30 05/16/2023 1542   BUN 19 05/16/2023 1542   BUN 20 09/01/2021 0756   CREATININE 1.04 05/16/2023 1542      Component Value Date/Time   CALCIUM  9.3 05/16/2023 1542   ALKPHOS 49 05/16/2023 1542   AST 16 05/16/2023 1542   ALT 14 05/16/2023 1542   BILITOT 0.4 05/16/2023 1542       RADIOGRAPHIC STUDIES: CT Chest W Contrast Result Date: 05/17/2023 CLINICAL DATA:  Non-small cell lung cancer (NSCLC), non-metastatic, assess treatment response Follow up on enlarging 6 mm nodule. * Tracking Code: BO * EXAM: CT CHEST WITH CONTRAST TECHNIQUE: Multidetector CT imaging of the chest was performed during  intravenous contrast administration. RADIATION DOSE REDUCTION: This exam was performed according to the departmental dose-optimization program which includes automated exposure control, adjustment of the mA and/or kV according to patient size and/or use of iterative reconstruction technique. CONTRAST:  75mL OMNIPAQUE  IOHEXOL  300 MG/ML  SOLN COMPARISON:  CT scan chest from 01/22/2023. FINDINGS: Cardiovascular: Normal cardiac size. No pericardial effusion. No aortic aneurysm. There are coronary artery calcifications, in  keeping with coronary artery disease. Mediastinum/Nodes: Visualized thyroid gland appears grossly unremarkable. No solid / cystic mediastinal masses. The esophagus is nondistended precluding optimal assessment. There are few mildly prominent mediastinal lymph nodes, which do not meet the size criteria for lymphadenopathy and though indeterminate most likely benign in etiology. No axillary or hilar lymphadenopathy by size criteria. Lungs/Pleura: The central tracheo-bronchial tree is patent. There is a new sub 5 mm filling defect along the right posterior wall of the trachea (series 5, image 41), favored to represent mucous/secretion. Redemonstration of postsurgical changes from prior right upper lobectomy. Mild diffuse emphysematous changes noted. There is continued slow interval increase in the size of the pre-existing middle lobe nodule, currently measuring 6 x 8 mm whereas previously measured up to 5 x 7 mm, when remeasured in similar fashion. No new mass or consolidation. No pleural effusion or pneumothorax. No new suspicious lung nodules. Upper Abdomen: There is a simple cyst in the right kidney interpolar region measuring 1.8 x 1.9 cm. The remaining visualized upper abdominal viscera within normal limits. Musculoskeletal: The visualized soft tissues of the chest wall are grossly unremarkable. No suspicious osseous lesions. There are mild multilevel degenerative changes in the visualized spine. IMPRESSION: 1. Continued slow interval increase in the size of the pre-existing middle lobe nodule, currently measuring 6 x 8 mm, concerning for neoplastic process. 2. Multiple other nonacute observations, as described above. Aortic Atherosclerosis (ICD10-I70.0) and Emphysema (ICD10-J43.9). Electronically Signed   By: Beula Brunswick M.D.   On: 05/17/2023 08:56     ASSESSMENT AND PLAN: This is a very pleasant 49 years old white male recently diagnosed with stage IIA (T2b, N0, M0) non-small cell lung cancer,  adenocarcinoma status post right upper lobectomy with lymph node sampling under the care of Dr. Deloise Ferries on March 28, 2020. The patient had molecular studies by foundation 1 that showed no actionable mutation and his PD-L1 expression was 5%. The patient underwent adjuvant systemic chemotherapy with cisplatin  75 Mg/M2 and Alimta  500 Mg/M2.  Status post 4 cycles.  Cisplatin  was replaced with carboplatin  starting from cycle #3.  The patient tolerated his treatment well except for significant nausea and vomiting during the cisplatin  treatment. The patient is currently on observation and he is feeling fine with no concerning complaints except for mild cough. He had repeat CT scan of the chest performed recently.  I personally and independently reviewed the scan and discussed the result and showed the images to the patient today.  There is continued slow interval increase in the size of the pre-existing middle lobe nodule concerning for neoplastic process.    Stage 2A non-small cell lung cancer, adenocarcinoma Status post right upper lobectomy with mediastinal lymph node sampling and adjuvant chemotherapy. Interval increase in size of pre-existing middle lobe nodule from 5x7 mm to 6x8 mm over nine months, concerning for malignancy. Nodule is too small for biopsy, and PET scan may not be informative due to size. Radiation therapy considered if nodule continues to grow, potentially administered in 3 to 5 sessions. - Repeat chest CT scan in 5 months to monitor  nodule size - Consider PET scan if nodule reaches 10 mm - Consider radiation therapy if nodule continues to grow  Sleep apnea Non-compliance with CPAP therapy due to gag reflex and vomiting, likely secondary to excessive snoring.   The patient was advised to call immediately if he has any concerning symptoms in the interval. The patient voices understanding of current disease status and treatment options and is in agreement with the current care  plan. The total time spent in the appointment was 30 minutes.  All questions were answered. The patient knows to call the clinic with any problems, questions or concerns. We can certainly see the patient much sooner if necessary.   Disclaimer: This note was dictated with voice recognition software. Similar sounding words can inadvertently be transcribed and may not be corrected upon review.

## 2023-07-03 DIAGNOSIS — Z85118 Personal history of other malignant neoplasm of bronchus and lung: Secondary | ICD-10-CM | POA: Diagnosis not present

## 2023-07-03 DIAGNOSIS — R55 Syncope and collapse: Secondary | ICD-10-CM | POA: Diagnosis not present

## 2023-07-03 DIAGNOSIS — E162 Hypoglycemia, unspecified: Secondary | ICD-10-CM | POA: Diagnosis not present

## 2023-07-03 DIAGNOSIS — R531 Weakness: Secondary | ICD-10-CM | POA: Diagnosis not present

## 2023-07-03 DIAGNOSIS — E11649 Type 2 diabetes mellitus with hypoglycemia without coma: Secondary | ICD-10-CM | POA: Diagnosis not present

## 2023-07-03 DIAGNOSIS — R200 Anesthesia of skin: Secondary | ICD-10-CM | POA: Diagnosis not present

## 2023-09-17 ENCOUNTER — Ambulatory Visit: Admitting: Family Medicine

## 2023-09-17 VITALS — BP 124/63 | HR 55 | Ht 71.0 in | Wt 266.0 lb

## 2023-09-17 DIAGNOSIS — H539 Unspecified visual disturbance: Secondary | ICD-10-CM

## 2023-09-17 DIAGNOSIS — Z85118 Personal history of other malignant neoplasm of bronchus and lung: Secondary | ICD-10-CM

## 2023-09-17 DIAGNOSIS — I700 Atherosclerosis of aorta: Secondary | ICD-10-CM

## 2023-09-17 DIAGNOSIS — Z0000 Encounter for general adult medical examination without abnormal findings: Secondary | ICD-10-CM

## 2023-09-17 DIAGNOSIS — R7303 Prediabetes: Secondary | ICD-10-CM

## 2023-09-17 DIAGNOSIS — Z1211 Encounter for screening for malignant neoplasm of colon: Secondary | ICD-10-CM

## 2023-09-17 DIAGNOSIS — E785 Hyperlipidemia, unspecified: Secondary | ICD-10-CM

## 2023-09-17 DIAGNOSIS — G4730 Sleep apnea, unspecified: Secondary | ICD-10-CM

## 2023-09-17 LAB — CBC WITH DIFFERENTIAL/PLATELET
Basophils Absolute: 0 K/uL (ref 0.0–0.1)
Basophils Relative: 0.6 % (ref 0.0–3.0)
Eosinophils Absolute: 0 K/uL (ref 0.0–0.7)
Eosinophils Relative: 0.7 % (ref 0.0–5.0)
HCT: 44.8 % (ref 39.0–52.0)
Hemoglobin: 15.2 g/dL (ref 13.0–17.0)
Lymphocytes Relative: 17.9 % (ref 12.0–46.0)
Lymphs Abs: 1 K/uL (ref 0.7–4.0)
MCHC: 33.9 g/dL (ref 30.0–36.0)
MCV: 93.8 fl (ref 78.0–100.0)
Monocytes Absolute: 0.6 K/uL (ref 0.1–1.0)
Monocytes Relative: 11.6 % (ref 3.0–12.0)
Neutro Abs: 3.8 K/uL (ref 1.4–7.7)
Neutrophils Relative %: 69.2 % (ref 43.0–77.0)
Platelets: 203 K/uL (ref 150.0–400.0)
RBC: 4.77 Mil/uL (ref 4.22–5.81)
RDW: 12.9 % (ref 11.5–15.5)
WBC: 5.5 K/uL (ref 4.0–10.5)

## 2023-09-17 LAB — LIPID PANEL
Cholesterol: 193 mg/dL (ref 0–200)
HDL: 37.8 mg/dL — ABNORMAL LOW (ref >39.00)
LDL Cholesterol: 113 mg/dL — ABNORMAL HIGH (ref 0–99)
NonHDL: 154.75
Total CHOL/HDL Ratio: 5
Triglycerides: 209 mg/dL — ABNORMAL HIGH (ref 0.0–149.0)
VLDL: 41.8 mg/dL — ABNORMAL HIGH (ref 0.0–40.0)

## 2023-09-17 LAB — COMPREHENSIVE METABOLIC PANEL WITH GFR
ALT: 14 U/L (ref 0–53)
AST: 16 U/L (ref 0–37)
Albumin: 4.7 g/dL (ref 3.5–5.2)
Alkaline Phosphatase: 48 U/L (ref 39–117)
BUN: 17 mg/dL (ref 6–23)
CO2: 31 meq/L (ref 19–32)
Calcium: 10.1 mg/dL (ref 8.4–10.5)
Chloride: 102 meq/L (ref 96–112)
Creatinine, Ser: 0.99 mg/dL (ref 0.40–1.50)
GFR: 91.57 mL/min (ref >60.00)
Glucose, Bld: 97 mg/dL (ref 70–99)
Potassium: 4.2 meq/L (ref 3.5–5.1)
Sodium: 140 meq/L (ref 135–145)
Total Bilirubin: 0.5 mg/dL (ref 0.2–1.2)
Total Protein: 7.1 g/dL (ref 6.0–8.3)

## 2023-09-17 LAB — MAGNESIUM: Magnesium: 1.9 mg/dL (ref 1.5–2.5)

## 2023-09-17 LAB — HEMOGLOBIN A1C: Hgb A1c MFr Bld: 5.6 % (ref 4.6–6.5)

## 2023-09-17 NOTE — Assessment & Plan Note (Signed)
 Obstructive sleep apnea with non-compliance to CPAP. Reports weight loss, may improve symptoms.

## 2023-09-17 NOTE — Progress Notes (Signed)
 New Patient Office Visit  Subjective    Patient ID: Jerry [name], male    DOB: [birth year]  Age: 48 y.o. MRN: [id number]  CC:  Chief Complaint  Patient presents with   Establish Care    HPI Jerry [name] presents to establish care He works as an Personnel officer.  He lives alone.    Discussed the use of AI scribe software for clinical note transcription with the patient, who gave verbal consent to proceed.  History of Present Illness Jerry [name] is a 48 year old male with a history of lung cancer who presents for a new patient visit and follow-up on his medical conditions.  He has a history of lung cancer diagnosed in 2022, treated with a lobectomy and chemotherapy. He has been doing well since the treatment but recently a small nodule was found, necessitating closer follow-up. Follow-up intervals vary, sometimes every three months or nine months, depending on the findings. His primary oncologist is Dr. Sherrod.  He is a former smoker with a history of coronary artery disease and aortic atherosclerosis noted on a CT Scan during cancer workup/treatment in 2022. He has not been set up with a cardiologist for these conditions.   He has sleep apnea but does not use his CPAP machine regularly. He has been intentionally losing weight, dropping from 327 pounds last April to a lower weight currently, attributed to improved eating habits.  His family history is significant for cancer and cardiovascular diseases. His mother had breast cancer, and his father, currently hospitalized, has high cholesterol, high blood pressure, a history of stroke, and ulcerative colitis. His paternal grandparents died young from stroke and heart-related issues. He also has aunts with lung and breast cancer.  His current medications include a multivitamin and magnesium  (reports low levels in the past). He has an allergy to olanzapine , which caused hallucinations when prescribed for nausea during  chemotherapy.           Outpatient Encounter Medications as of 09/17/2023  Medication Sig   desonide  (DESOWEN ) 0.05 % cream Apply topically 2 (two) times daily. Apply to eyelid twice daily (Patient taking differently: Apply topically as needed. Apply to eyelid twice daily)   magnesium  oxide (MAG-OX) 400 MG tablet Take 1 tablet by mouth daily.   Multiple Vitamins-Minerals (ONE DAILY MULTIVITAMIN MEN) TABS Take 1 tablet by mouth daily.   triamcinolone  cream (KENALOG ) 0.1 % Apply 1 application. topically 2 (two) times daily. (Patient taking differently: Apply 1 application  topically as needed.)   Facility-Administered Encounter Medications as of 09/17/2023  Medication   0.9 %  sodium chloride  infusion   0.9 %  sodium chloride  infusion   ondansetron  (ZOFRAN ) injection 8 mg    Past Medical History:  Diagnosis Date   Aortic atherosclerosis (HCC) 08/05/2020   per chest CT   CAD (coronary artery disease), native coronary artery 08/05/2020   Left anterior descending CAD noted on chest CT   Disorder due to vaping    Former smoker    Intractable nausea and vomiting 05/11/2020   lung ca 01/2020   Otitis media 07/03/2021   Pneumonia    Sleep apnea     Past Surgical History:  Procedure Laterality Date   INTERCOSTAL NERVE BLOCK Right 03/28/2020   Procedure: INTERCOSTAL NERVE BLOCK;  Surgeon: Shyrl Linnie KIDD, MD;  Location: [facility] OR;  Service: Thoracic;  Laterality: Right;   LUNG LOBECTOMY Right 03/30/2020   LYMPH NODE DISSECTION Right 03/28/2020   Procedure: LYMPH NODE DISSECTION;  Surgeon:  Shyrl Linnie KIDD, MD;  Location: [facility] OR;  Service: Thoracic;  Laterality: Right;   VIDEO BRONCHOSCOPY WITH ENDOBRONCHIAL NAVIGATION Right 03/02/2020   Procedure: VIDEO BRONCHOSCOPY WITH ENDOBRONCHIAL NAVIGATION;  Surgeon: Brenna Adine CROME, DO;  Location: [facility] OR;  Service: Pulmonary;  Laterality: Right;   VIDEO BRONCHOSCOPY WITH ENDOBRONCHIAL ULTRASOUND Bilateral 03/02/2020   Procedure: VIDEO  BRONCHOSCOPY WITH ENDOBRONCHIAL ULTRASOUND;  Surgeon: Brenna Adine CROME, DO;  Location: [facility] OR;  Service: Pulmonary;  Laterality: Bilateral;   WISDOM TOOTH EXTRACTION      Family History  Problem Relation Age of Onset   Cancer Mother        breast   Stroke Father    Hypertension Father    Hyperlipidemia Father    Arrhythmia Father    COPD Father    Hearing loss Father    Vision loss Father    Ulcerative colitis Father    Hypertension Maternal Grandmother    Cancer Maternal Grandmother        breast   Vision loss Maternal Grandmother    Early death Paternal Grandmother 28       stroke   Early death Paternal Grandfather 29       heart attack   Lung cancer Paternal Aunt    Cancer Paternal Aunt        breast, colon   Sleep apnea Neg Hx     Social History   Socioeconomic History   Marital status: Married    Spouse name: Not on file   Number of children: 6   Years of education: Not on file   Highest education level: Some college, no degree  Occupational History   Occupation: Personnel officer  Tobacco Use   Smoking status: Former    Current packs/day: 0.00    Average packs/day: 1 pack/day for 17.0 years (17.0 ttl pk-yrs)    Types: Cigarettes    Start date: 03/01/1991    Quit date: 02/29/2008    Years since quitting: 15.5   Smokeless tobacco: Never  Vaping Use   Vaping status: Former   Start date: 02/08/2013   Quit date: 02/29/2020   Substances: Nicotine  Substance and Sexual Activity   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   Drug use: Never   Sexual activity: Yes    Partners: Female  Other Topics Concern   Not on file  Social History Narrative   Not on file   Social Drivers of Health   Financial Resource Strain: Medium Risk (09/17/2023)   Overall Financial Resource Strain (CARDIA)    Difficulty of Paying Living Expenses: Somewhat hard  Food Insecurity: No Food Insecurity (09/17/2023)   Hunger Vital Sign    Worried About Running Out of Food in the Last  Year: Never true    Ran Out of Food in the Last Year: Never true  Transportation Needs: No Transportation Needs (09/17/2023)   PRAPARE - Administrator, Civil Service (Medical): No    Lack of Transportation (Non-Medical): No  Physical Activity: Insufficiently Active (09/17/2023)   Exercise Vital Sign    Days of Exercise per Week: 3 days    Minutes of Exercise per Session: 30 min  Stress: No Stress Concern Present (09/17/2023)   [employer] of Occupational Health - Occupational Stress Questionnaire    Feeling of Stress: Not at all  Social Connections: Socially Integrated (09/17/2023)   Social Connection and Isolation Panel    Frequency of Communication with Friends and Family: More  than three times a week    Frequency of Social Gatherings with Friends and Family: Patient declined    Attends Religious Services: More than 4 times per year    Active Member of [employer] or Organizations: Yes    Attends Engineer, structural: More than 4 times per year    Marital Status: Married  Catering manager Violence: Not At Risk (09/01/2021)   Humiliation, Afraid, Rape, and Kick questionnaire    Fear of Current or Ex-Partner: No    Emotionally Abused: No    Physically Abused: No    Sexually Abused: No    ROS All review of systems negative except what is listed in the HPI       Objective    BP 124/63   Pulse (!) 55   Ht 5' 11 (1.803 m)   Wt 266 lb (120.7 kg)   SpO2 100%   BMI 37.10 kg/m   Physical Exam Vitals reviewed.  Constitutional:      Appearance: Normal appearance.  Cardiovascular:     Rate and Rhythm: Normal rate and regular rhythm.     Heart sounds: Normal heart sounds.  Pulmonary:     Effort: Pulmonary effort is normal.     Breath sounds: No wheezing, rhonchi or rales.  Skin:    General: Skin is warm and dry.  Neurological:     Mental Status: He is alert and oriented to person, place, and time.  Psychiatric:        Mood and Affect: Mood normal.         Behavior: Behavior normal.        Thought Content: Thought content normal.        Judgment: Judgment normal.             Assessment & Plan:   Problem List Items Addressed This Visit       Active Problems   Sleep apnea (Chronic)   Obstructive sleep apnea with non-compliance to CPAP. Reports weight loss, may improve symptoms.      Hyperlipidemia - Primary (Chronic)   Hyperlipidemia managed with diet. Prefers lifestyle modifications. Reports cholesterol levels improve with dietary changes. - Check lipid panel with current blood work to assess cholesterol levels.      Relevant Orders   Lipid panel   Prediabetes (Chronic)   Prediabetes with previous A1c in prediabetic range. Reports improved diet and weight loss. - Recheck A1c with current blood work to assess glucose control.      Relevant Orders   Hemoglobin A1c   Hypomagnesemia (Chronic)   Daily supplement. Labs today.      Relevant Orders   Comprehensive metabolic panel with GFR   Magnesium    Aortic atherosclerosis (HCC) (Chronic)   Noted on previous scan. Prefers to avoid medication. Rechecking lipid panel today.       Relevant Orders   Lipid panel   History of lung cancer   Lung cancer treated with lobectomy and chemotherapy in 2022. Under surveillance. Last scan showed small nodule, requires closer follow-up. - Continue follow-up with oncologist, Dr. Sherrod, for surveillance scans.      Relevant Orders   CBC with Differential/Platelet   Other Visit Diagnoses       Encounter for medical examination to establish care          Colon cancer screening       Relevant Orders   Ambulatory referral to Gastroenterology     Vision changes     After visit,  patient mentioned some gradual vision change. No severe symptoms. Referral to ophthalmology.    Relevant Orders   Ambulatory referral to Ophthalmology           Return in about 6 months (around 03/16/2024) for physical.   Waddell KATHEE Mon, NP

## 2023-09-17 NOTE — Assessment & Plan Note (Signed)
 Noted on previous scan. Prefers to avoid medication. Rechecking lipid panel today.

## 2023-09-17 NOTE — Assessment & Plan Note (Signed)
 Hyperlipidemia managed with diet. Prefers lifestyle modifications. Reports cholesterol levels improve with dietary changes. - Check lipid panel with current blood work to assess cholesterol levels.

## 2023-09-17 NOTE — Assessment & Plan Note (Signed)
 Prediabetes with previous A1c in prediabetic range. Reports improved diet and weight loss. - Recheck A1c with current blood work to assess glucose control.

## 2023-09-17 NOTE — Patient Instructions (Signed)

## 2023-09-17 NOTE — Assessment & Plan Note (Signed)
 Daily supplement. Labs today.

## 2023-09-17 NOTE — Assessment & Plan Note (Signed)
 Lung cancer treated with lobectomy and chemotherapy in 2022. Under surveillance. Last scan showed small nodule, requires closer follow-up. - Continue follow-up with oncologist, Dr. Sherrod, for surveillance scans.

## 2023-09-18 ENCOUNTER — Ambulatory Visit: Payer: Self-pay | Admitting: Family Medicine

## 2023-10-14 ENCOUNTER — Ambulatory Visit (HOSPITAL_COMMUNITY)
Admission: RE | Admit: 2023-10-14 | Discharge: 2023-10-14 | Disposition: A | Discharge status: Home / Self Care | Source: Ambulatory Visit | Attending: Internal Medicine | Admitting: Internal Medicine

## 2023-10-14 ENCOUNTER — Telehealth: Payer: Self-pay

## 2023-10-14 ENCOUNTER — Inpatient Hospital Stay: Attending: Internal Medicine

## 2023-10-14 DIAGNOSIS — C3490 Malignant neoplasm of unspecified part of unspecified bronchus or lung: Secondary | ICD-10-CM | POA: Diagnosis not present

## 2023-10-14 DIAGNOSIS — Z85118 Personal history of other malignant neoplasm of bronchus and lung: Secondary | ICD-10-CM | POA: Diagnosis present

## 2023-10-14 DIAGNOSIS — R748 Abnormal levels of other serum enzymes: Secondary | ICD-10-CM | POA: Diagnosis not present

## 2023-10-14 DIAGNOSIS — Z79899 Other long term (current) drug therapy: Secondary | ICD-10-CM | POA: Diagnosis not present

## 2023-10-14 DIAGNOSIS — Z902 Acquired absence of lung [part of]: Secondary | ICD-10-CM | POA: Diagnosis not present

## 2023-10-14 DIAGNOSIS — Z87891 Personal history of nicotine dependence: Secondary | ICD-10-CM | POA: Diagnosis not present

## 2023-10-14 DIAGNOSIS — I700 Atherosclerosis of aorta: Secondary | ICD-10-CM | POA: Diagnosis not present

## 2023-10-14 DIAGNOSIS — R911 Solitary pulmonary nodule: Secondary | ICD-10-CM | POA: Insufficient documentation

## 2023-10-14 LAB — CBC WITH DIFFERENTIAL (CANCER CENTER ONLY)
Abs Immature Granulocytes: 0.02 K/uL (ref 0.00–0.07)
Basophils Absolute: 0.1 K/uL (ref 0.0–0.1)
Basophils Relative: 1 %
Eosinophils Absolute: 0.1 K/uL (ref 0.0–0.5)
Eosinophils Relative: 1 %
HCT: 43.4 % (ref 39.0–52.0)
Hemoglobin: 14.9 g/dL (ref 13.0–17.0)
Immature Granulocytes: 0 %
Lymphocytes Relative: 18 %
Lymphs Abs: 1.3 K/uL (ref 0.7–4.0)
MCH: 31.9 pg (ref 26.0–34.0)
MCHC: 34.3 g/dL (ref 30.0–36.0)
MCV: 92.9 fL (ref 80.0–100.0)
Monocytes Absolute: 0.6 K/uL (ref 0.1–1.0)
Monocytes Relative: 8 %
Neutro Abs: 5.3 K/uL (ref 1.7–7.7)
Neutrophils Relative %: 72 %
Platelet Count: 212 K/uL (ref 150–400)
RBC: 4.67 MIL/uL (ref 4.22–5.81)
RDW: 12.1 % (ref 11.5–15.5)
WBC Count: 7.4 K/uL (ref 4.0–10.5)
nRBC: 0 % (ref 0.0–0.2)

## 2023-10-14 LAB — CMP (CANCER CENTER ONLY)
ALT: 337 U/L (ref 0–44)
AST: 130 U/L — ABNORMAL HIGH (ref 15–41)
Albumin: 4.8 g/dL (ref 3.5–5.0)
Alkaline Phosphatase: 57 U/L (ref 38–126)
Anion gap: 6 (ref 5–15)
BUN: 19 mg/dL (ref 6–20)
CO2: 30 mmol/L (ref 22–32)
Calcium: 10.5 mg/dL — ABNORMAL HIGH (ref 8.9–10.3)
Chloride: 104 mmol/L (ref 98–111)
Creatinine: 1.08 mg/dL (ref 0.61–1.24)
GFR, Estimated: >60 mL/min (ref >60)
Glucose, Bld: 91 mg/dL (ref 70–99)
Potassium: 4.2 mmol/L (ref 3.5–5.1)
Sodium: 140 mmol/L (ref 135–145)
Total Bilirubin: 0.5 mg/dL (ref 0.0–1.2)
Total Protein: 7.8 g/dL (ref 6.5–8.1)

## 2023-10-14 MED ORDER — IOHEXOL 300 MG/ML  SOLN
75.0000 mL | Freq: Once | INTRAMUSCULAR | Status: AC | PRN
  Administered 2023-10-14: 75 mL via INTRAVENOUS

## 2023-10-14 NOTE — Progress Notes (Signed)
 CRITICAL VALUE STICKER  CRITICAL VALUE: ALT: [id number]  RECEIVER (on-site recipient of call): Landry ORN RN  DATE & TIME NOTIFIED: 10/14/23 1624  MESSENGER (representative from lab): Harlene SQUIBB  MD NOTIFIED: Dr Sherrod  TIME OF NOTIFICATION:10/14/23 1630  RESPONSE:  MD aware

## 2023-10-14 NOTE — Telephone Encounter (Signed)
 Spoke with patient in regards to needing blood work done at his next appointment on 10/14 due to elevated liver enzymes.   Informed patient to stop any drinking or taking NSAIDS until we see him on 10/14 for his follow up and to repeat blood work. Patient voiced understanding.

## 2023-10-22 ENCOUNTER — Inpatient Hospital Stay: Admitting: Internal Medicine

## 2023-10-22 ENCOUNTER — Inpatient Hospital Stay

## 2023-10-22 VITALS — BP 136/78 | Temp 97.0°F | Resp 17 | Ht 71.0 in | Wt 258.0 lb

## 2023-10-22 DIAGNOSIS — Z85118 Personal history of other malignant neoplasm of bronchus and lung: Secondary | ICD-10-CM | POA: Diagnosis not present

## 2023-10-22 DIAGNOSIS — Z79899 Other long term (current) drug therapy: Secondary | ICD-10-CM

## 2023-10-22 DIAGNOSIS — C3491 Malignant neoplasm of unspecified part of right bronchus or lung: Secondary | ICD-10-CM

## 2023-10-22 DIAGNOSIS — Z902 Acquired absence of lung [part of]: Secondary | ICD-10-CM

## 2023-10-22 DIAGNOSIS — Z87891 Personal history of nicotine dependence: Secondary | ICD-10-CM

## 2023-10-22 DIAGNOSIS — R911 Solitary pulmonary nodule: Secondary | ICD-10-CM

## 2023-10-22 DIAGNOSIS — R748 Abnormal levels of other serum enzymes: Secondary | ICD-10-CM

## 2023-10-22 DIAGNOSIS — C3490 Malignant neoplasm of unspecified part of unspecified bronchus or lung: Secondary | ICD-10-CM

## 2023-10-22 LAB — CMP (CANCER CENTER ONLY)
ALT: 46 U/L — ABNORMAL HIGH (ref 0–44)
AST: 26 U/L (ref 15–41)
Albumin: 4.7 g/dL (ref 3.5–5.0)
Alkaline Phosphatase: 53 U/L (ref 38–126)
Anion gap: 5 (ref 5–15)
BUN: 19 mg/dL (ref 6–20)
CO2: 31 mmol/L (ref 22–32)
Calcium: 10.2 mg/dL (ref 8.9–10.3)
Chloride: 104 mmol/L (ref 98–111)
Creatinine: 1.07 mg/dL (ref 0.61–1.24)
GFR, Estimated: >60 mL/min (ref >60)
Glucose, Bld: 104 mg/dL — ABNORMAL HIGH (ref 70–99)
Potassium: 4.3 mmol/L (ref 3.5–5.1)
Sodium: 140 mmol/L (ref 135–145)
Total Bilirubin: 0.6 mg/dL (ref 0.0–1.2)
Total Protein: 7.1 g/dL (ref 6.5–8.1)

## 2023-10-22 NOTE — Progress Notes (Signed)
 [name] [phone]   Fax:(336) [phone]  OFFICE PROGRESS NOTE  Jerry Waddell NOVAK, NP [address] [place] [address]  DIAGNOSIS: Stage IIA (T2b, N0, M0) non-small cell lung cancer, adenocarcinoma presented with right upper lobe lung mass diagnosed in February 2022.  Biomarker Findings Microsatellite status - MS-Stable Tumor Mutational Burden - 4 Muts/Mb Genomic Findings For a complete list of the genes assayed, please refer to the Appendix. KRAS G12D KEAP1 P278S 7 Disease relevant genes with no reportable alterations: ALK, BRAF, EGFR, ERBB2, MET, RET, ROS1  PDL1 Expression 5%.  PRIOR THERAPY:  1) Status post robotic assisted right video thoracoscopy with right upper lobectomy and mediastinal lymph node sampling under the care of Dr. Shyrl on March 28, 2020. 2) Adjuvant systemic chemotherapy with cisplatin  75 Mg/M2 and Alimta  500 Mg/M2 every 3 weeks.  First dose May 05, 2020.  Status post 4 cycles.  Last dose was giving July 07, 2020.  Cisplatin  was discontinued after cycle #2 secondary to uncontrolled nausea and vomiting and it was replaced with carboplatin  for the last 2 cycles.  CURRENT THERAPY: Observation.  INTERVAL HISTORY: Jerry [name] 48 y.o. male returns to the clinic today for follow-up visit accompanied by his wife.  Discussed the use of AI scribe software for clinical note transcription with the patient, who gave verbal consent to proceed.  History of Present Illness Jerry [name] is a 48 year old male with stage IIA non-small cell lung cancer who presents for evaluation with a CT scan of the chest for restaging of his disease. He is accompanied by his wife.  He was diagnosed with stage IIA non-small cell lung cancer, adenocarcinoma, in February 2022. He underwent a right upper lobectomy with lymph node sampling followed by adjuvant systemic chemotherapy. Initially, he received two cycles of cisplatin  and  Alimta , followed by two cycles of carboplatin  and Alimta . He has been under observation since June 2022.  During the summer, he experienced an episode of syncope and arm weakness. Evaluation in the ER revealed a blood sugar level of 67 mg/dL. Since then, he has been taking over-the-counter magnesium  and multivitamins, along with sports drinks, which he reports have helped alleviate his symptoms.  No recent alcohol consumption, although he notes that one of his liver enzymes, ALT was significantly elevated. He has not been taking Tylenol  or ibuprofen in large amounts and cannot recall any changes that might explain the elevated ALT liver enzymes.  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 His current medications include over-the-counter magnesium  and multivitamins.    MEDICAL HISTORY: Past Medical History:  Diagnosis Date   Aortic atherosclerosis 08/05/2020   per chest CT   CAD (coronary artery disease), native coronary artery 08/05/2020   Left anterior descending CAD noted on chest CT   Disorder due to vaping    Former smoker    Intractable nausea and vomiting 05/11/2020   lung ca 01/2020   Otitis media 07/03/2021   Pneumonia    Sleep apnea     ALLERGIES:  is allergic to olanzapine .  MEDICATIONS:  Current Outpatient Medications  Medication Sig Dispense Refill   desonide  (DESOWEN ) 0.05 % cream Apply topically 2 (two) times daily. Apply to eyelid twice daily (Patient taking differently: Apply topically as needed. Apply to eyelid twice daily) 30 g 0   magnesium  oxide (MAG-OX) 400 MG tablet Take 1 tablet by mouth  daily.     Multiple Vitamins-Minerals (ONE DAILY MULTIVITAMIN MEN) TABS Take 1 tablet by mouth daily.     triamcinolone  cream (KENALOG ) 0.1 % Apply 1 application. topically 2 (two) times daily. (Patient taking differently: Apply 1 application  topically as needed.) 30 g 0   No current facility-administered  medications for this visit.   Facility-Administered Medications Ordered in Other Visits  Medication Dose Route Frequency Provider Last Rate Last Admin   0.9 %  sodium chloride  infusion   Intravenous Continuous Sherrod Sherrod, MD       0.9 %  sodium chloride  infusion   Intravenous Continuous Sherrod Sherrod, MD 900 mL/hr at 05/11/20 0940 New Bag at 05/11/20 0940   ondansetron  (ZOFRAN ) injection 8 mg  8 mg Intravenous Once Carolee Loa DEL, RN        SURGICAL HISTORY:  Past Surgical History:  Procedure Laterality Date   INTERCOSTAL NERVE BLOCK Right 03/28/2020   Procedure: INTERCOSTAL NERVE BLOCK;  Surgeon: Shyrl Linnie KIDD, MD;  Location: [facility];  Service: Thoracic;  Laterality: Right;   LUNG LOBECTOMY Right 03/30/2020   LYMPH NODE DISSECTION Right 03/28/2020   Procedure: LYMPH NODE DISSECTION;  Surgeon: Shyrl Linnie KIDD, MD;  Location: [facility] OR;  Service: Thoracic;  Laterality: Right;   VIDEO BRONCHOSCOPY WITH ENDOBRONCHIAL NAVIGATION Right 03/02/2020   Procedure: VIDEO BRONCHOSCOPY WITH ENDOBRONCHIAL NAVIGATION;  Surgeon: Brenna Adine CROME, DO;  Location: [facility] OR;  Service: Pulmonary;  Laterality: Right;   VIDEO BRONCHOSCOPY WITH ENDOBRONCHIAL ULTRASOUND Bilateral 03/02/2020   Procedure: VIDEO BRONCHOSCOPY WITH ENDOBRONCHIAL ULTRASOUND;  Surgeon: Brenna Adine CROME, DO;  Location: [facility] OR;  Service: Pulmonary;  Laterality: Bilateral;   WISDOM TOOTH EXTRACTION      REVIEW OF SYSTEMS:  Constitutional: negative Eyes: negative Ears, nose, mouth, throat, and face: negative Respiratory: negative Cardiovascular: negative Gastrointestinal: negative Genitourinary:negative Integument/breast: negative Hematologic/lymphatic: negative Musculoskeletal:negative Neurological: negative Behavioral/Psych: negative Endocrine: negative Allergic/Immunologic: negative   PHYSICAL EXAMINATION: General appearance: alert, cooperative, and no distress Head: Normocephalic, without obvious abnormality,  atraumatic Neck: no adenopathy, no JVD, supple, symmetrical, trachea midline, and thyroid not enlarged, symmetric, no tenderness/mass/nodules Lymph nodes: Cervical, supraclavicular, and axillary nodes normal. Resp: clear to auscultation bilaterally Back: symmetric, no curvature. ROM normal. No CVA tenderness. Cardio: regular rate and rhythm, S1, S2 normal, no murmur, click, rub or gallop GI: soft, non-tender; bowel sounds normal; no masses,  no organomegaly Extremities: extremities normal, atraumatic, no cyanosis or edema Neurologic: Alert and oriented X 3, normal strength and tone. Normal symmetric reflexes. Normal coordination and gait  ECOG PERFORMANCE STATUS: 1 - Symptomatic but completely ambulatory  Blood pressure 136/78, temperature (!) 97 F (36.1 C), resp. rate 17, height 5' 11 (1.803 m), weight 258 lb (117 kg).  LABORATORY DATA: Lab Results  Component Value Date   WBC 7.4 10/14/2023   HGB 14.9 10/14/2023   HCT 43.4 10/14/2023   MCV 92.9 10/14/2023   PLT 212 10/14/2023      Chemistry      Component Value Date/Time   NA 140 10/14/2023 1543   NA 142 09/01/2021 0756   K 4.2 10/14/2023 1543   CL 104 10/14/2023 1543   CO2 30 10/14/2023 1543   BUN 19 10/14/2023 1543   BUN 20 09/01/2021 0756   CREATININE 1.08 10/14/2023 1543      Component Value Date/Time   CALCIUM  10.5 (H) 10/14/2023 1543   ALKPHOS 57 10/14/2023 1543   AST 130 (H) 10/14/2023 1543   ALT 337 (HH) 10/14/2023 1543  BILITOT 0.5 10/14/2023 1543       RADIOGRAPHIC STUDIES: CT Chest W Contrast Result Date: 10/16/2023 CLINICAL DATA:  Non-small cell lung cancer.  * Tracking Code: BO * EXAM: CT CHEST WITH CONTRAST TECHNIQUE: Multidetector CT imaging of the chest was performed during intravenous contrast administration. RADIATION DOSE REDUCTION: This exam was performed according to the departmental dose-optimization program which includes automated exposure control, adjustment of the mA and/or kV according  to patient size and/or use of iterative reconstruction technique. CONTRAST:  75mL OMNIPAQUE  IOHEXOL  300 MG/ML  SOLN COMPARISON:  05/16/2023. FINDINGS: Cardiovascular: Atherosclerotic calcification of the aorta with age advanced involvement of all 3 coronary arteries. Heart is at the upper limits of normal in size. No pericardial effusion. Mediastinum/Nodes: No pathologically enlarged mediastinal, hilar or axillary lymph nodes. Esophagus is grossly unremarkable. Lungs/Pleura: Right upper lobectomy. Additional postoperative scarring in the superior segment right lower lobe. Medial segment right middle lobe nodule measures 6 x 11 mm (5/76), increased in size from 6 x 8 mm on 05/16/2023 and from 3 mm on 03/19/2022. Mild scarring in the left lower lobe. Lungs are otherwise clear. No pleural fluid. Airway is otherwise unremarkable. Upper Abdomen: Visualized portions of the liver, gallbladder, adrenal glands, kidneys, spleen, pancreas, stomach and bowel are grossly unremarkable. No upper abdominal adenopathy. Musculoskeletal: Degenerative changes in the spine. IMPRESSION: 1. Continued indolent enlargement of a right middle lobe nodule, now 9 mm in size. Consider PET in further evaluation. 2.  Age advanced three-vessel coronary artery calcification. 3.  Aortic atherosclerosis (ICD10-I70.0). Electronically Signed   By: Newell Eke M.D.   On: 10/16/2023 15:18     ASSESSMENT AND PLAN: This is a very pleasant 48 years old white male recently diagnosed with stage IIA (T2b, N0, M0) non-small cell lung cancer, adenocarcinoma status post right upper lobectomy with lymph node sampling under the care of Dr. Shyrl on March 28, 2020. The patient had molecular studies by foundation 1 that showed no actionable mutation and his PD-L1 expression was 5%. The patient underwent adjuvant systemic chemotherapy with cisplatin  75 Mg/M2 and Alimta  500 Mg/M2.  Status post 4 cycles.  Cisplatin  was replaced with carboplatin  starting  from cycle #3.  The patient tolerated his treatment well except for significant nausea and vomiting during the cisplatin  treatment. The patient is currently on observation and he is feeling fine with no concerning complaints. He had repeat CT scan of the chest performed recently.  I personally independently reviewed the scan images and discussed the results and showed the images to the patient and his wife.  His scan showed further slight increase in the size of the right middle lobe nodule. Assessment and Plan Assessment & Plan Non-small cell lung cancer, status post right upper lobectomy with slowly enlarging right pulmonary nodule Stage IIa non-small cell lung cancer, adenocarcinoma, diagnosed in February 2022. Status post right upper lobectomy and adjuvant chemotherapy. Currently under observation since June 2022. A small right pulmonary nodule has been slowly enlarging, now potentially detectable on PET scan. Suspicion of malignancy due to growth pattern. - Order PET scan to evaluate the activity of the right pulmonary nodule. - If the nodule is active on PET scan, consider biopsy or radiation therapy. - If the nodule is not active, continue observation. - Schedule follow-up appointment in 2-3 weeks.  Elevated liver enzyme, ALT Significantly elevated liver enzymes with no recent alcohol consumption. Potential causes include lab error, medication, or supplement effects. No clear etiology identified from current history. - Repeat liver enzyme  tests to confirm elevation. - If liver enzymes remain elevated, refer to hepatology for further evaluation. - Advise to stop all medications and supplements except magnesium  until liver enzyme results are clarified.  Hypomagnesemia on magnesium  supplementation Hypomagnesemia with symptomatic weakness and syncope, managed with over-the-counter magnesium  supplementation and multivitamins. Symptoms improved with supplementation. - Continue magnesium   supplementation. He was advised to call immediately if he has any concerning symptoms in the interval.   The patient voices understanding of current disease status and treatment options and is in agreement with the current care plan. The total time spent in the appointment was 30 minutes.  All questions were answered. The patient knows to call the clinic with any problems, questions or concerns. We can certainly see the patient much sooner if necessary.   Disclaimer: This note was dictated with voice recognition software. Similar sounding words can inadvertently be transcribed and may not be corrected upon review.

## 2023-10-31 ENCOUNTER — Ambulatory Visit (HOSPITAL_COMMUNITY)
Admission: RE | Admit: 2023-10-31 | Discharge: 2023-10-31 | Disposition: A | Discharge status: Home / Self Care | Source: Ambulatory Visit | Attending: Internal Medicine | Admitting: Internal Medicine

## 2023-10-31 ENCOUNTER — Ambulatory Visit: Payer: BC Managed Care – PPO | Admitting: Adult Health

## 2023-10-31 DIAGNOSIS — C3490 Malignant neoplasm of unspecified part of unspecified bronchus or lung: Secondary | ICD-10-CM | POA: Insufficient documentation

## 2023-10-31 DIAGNOSIS — R911 Solitary pulmonary nodule: Secondary | ICD-10-CM | POA: Diagnosis not present

## 2023-10-31 LAB — GLUCOSE, CAPILLARY: Glucose-Capillary: 99 mg/dL (ref 70–99)

## 2023-10-31 MED ORDER — FLUDEOXYGLUCOSE F - 18 (FDG) INJECTION
13.5000 | Freq: Once | INTRAVENOUS | Status: AC
  Administered 2023-10-31: 12.4 via INTRAVENOUS

## 2023-11-14 ENCOUNTER — Inpatient Hospital Stay: Attending: Internal Medicine | Admitting: Internal Medicine

## 2023-11-14 VITALS — BP 136/80 | HR 65 | Temp 97.6°F | Resp 17 | Ht 71.0 in | Wt 236.0 lb

## 2023-11-14 DIAGNOSIS — C3490 Malignant neoplasm of unspecified part of unspecified bronchus or lung: Secondary | ICD-10-CM | POA: Diagnosis not present

## 2023-11-14 DIAGNOSIS — Z87891 Personal history of nicotine dependence: Secondary | ICD-10-CM | POA: Diagnosis not present

## 2023-11-14 DIAGNOSIS — Z85118 Personal history of other malignant neoplasm of bronchus and lung: Secondary | ICD-10-CM | POA: Insufficient documentation

## 2023-11-14 DIAGNOSIS — Z902 Acquired absence of lung [part of]: Secondary | ICD-10-CM | POA: Diagnosis not present

## 2023-11-14 NOTE — Progress Notes (Signed)
 [facility] Telephone:([phone]   Fax:([phone]  OFFICE PROGRESS NOTE  Jerry Waddell NOVAK, NP [address] [place] [address]  DIAGNOSIS: Stage IIA (T2b, N0, M0) non-small cell lung cancer, adenocarcinoma presented with right upper lobe lung mass diagnosed in February 2022.  Biomarker Findings Microsatellite status - MS-Stable Tumor Mutational Burden - 4 Muts/Mb Genomic Findings For a complete list of the genes assayed, please refer to the Appendix. KRAS G12D KEAP1 P278S 7 Disease relevant genes with no reportable alterations: ALK, BRAF, EGFR, ERBB2, MET, RET, ROS1  PDL1 Expression 5%.  PRIOR THERAPY:  1) Status post robotic assisted right video thoracoscopy with right upper lobectomy and mediastinal lymph node sampling under the care of Dr. Shyrl on March 28, 2020. 2) Adjuvant systemic chemotherapy with cisplatin  75 Mg/M2 and Alimta  500 Mg/M2 every 3 weeks.  First dose May 05, 2020.  Status post 4 cycles.  Last dose was giving July 07, 2020.  Cisplatin  was discontinued after cycle #2 secondary to uncontrolled nausea and vomiting and it was replaced with carboplatin  for the last 2 cycles.  CURRENT THERAPY: Referral to radiation oncology for SBRT to the right middle lobe lung nodule.  INTERVAL HISTORY: Jerry [name] 48 y.o. male returns to the clinic today for follow-up visit.  Discussed the use of AI scribe software for clinical note transcription with the patient, who gave verbal consent to proceed.  History of Present Illness Jerry [name] is a 48 year old male with stage 2A adenocarcinoma of the lung who presents for evaluation of a right middle lobe lung nodule.  He was diagnosed with stage 2A adenocarcinoma of the lung in February 2022 and underwent a right upper lobectomy with mediastinal lymph node sampling. This was followed by four cycles of adjuvant systemic chemotherapy with cisplatin  and Alimta , with the last dose  administered in June 2022. Since then, he has been under observation.  A recent CT scan revealed a slowly enlarging nodule in the right middle lobe of his lung. A subsequent PET scan was performed to evaluate this nodule. He is here to discuss the results of the PET scan and potential next steps regarding the nodule.  No new issues or complaints since his last visit.    MEDICAL HISTORY: Past Medical History:  Diagnosis Date   Aortic atherosclerosis 08/05/2020   per chest CT   CAD (coronary artery disease), native coronary artery 08/05/2020   Left anterior descending CAD noted on chest CT   Disorder due to vaping    Former smoker    Intractable nausea and vomiting 05/11/2020   lung ca 01/2020   Otitis media 07/03/2021   Pneumonia    Sleep apnea     ALLERGIES:  is allergic to olanzapine .  MEDICATIONS:  Current Outpatient Medications  Medication Sig Dispense Refill   desonide  (DESOWEN ) 0.05 % cream Apply topically 2 (two) times daily. Apply to eyelid twice daily (Patient taking differently: Apply topically as needed. Apply to eyelid twice daily) 30 g 0   magnesium  oxide (MAG-OX) 400 MG tablet Take 1 tablet by mouth daily.     Multiple Vitamins-Minerals (ONE DAILY MULTIVITAMIN MEN) TABS Take 1 tablet by mouth daily.     triamcinolone  cream (KENALOG ) 0.1 % Apply 1 application. topically 2 (two) times daily. (Patient taking differently: Apply 1 application  topically as needed.) 30 g 0   No current facility-administered medications for this visit.   Facility-Administered Medications Ordered in Other  Visits  Medication Dose Route Frequency Provider Last Rate Last Admin   0.9 %  sodium chloride  infusion   Intravenous Continuous Sherrod Sherrod, MD       0.9 %  sodium chloride  infusion   Intravenous Continuous Sherrod Sherrod, MD 900 mL/hr at 05/11/20 0940 New Bag at 05/11/20 0940   ondansetron  (ZOFRAN ) injection 8 mg  8 mg Intravenous Once Carolee Loa DEL, RN        SURGICAL  HISTORY:  Past Surgical History:  Procedure Laterality Date   INTERCOSTAL NERVE BLOCK Right 03/28/2020   Procedure: INTERCOSTAL NERVE BLOCK;  Surgeon: Shyrl Linnie KIDD, MD;  Location: [facility] OR;  Service: Thoracic;  Laterality: Right;   LUNG LOBECTOMY Right 03/30/2020   LYMPH NODE DISSECTION Right 03/28/2020   Procedure: LYMPH NODE DISSECTION;  Surgeon: Shyrl Linnie KIDD, MD;  Location: [facility] OR;  Service: Thoracic;  Laterality: Right;   VIDEO BRONCHOSCOPY WITH ENDOBRONCHIAL NAVIGATION Right 03/02/2020   Procedure: VIDEO BRONCHOSCOPY WITH ENDOBRONCHIAL NAVIGATION;  Surgeon: Brenna Adine CROME, DO;  Location: [facility] OR;  Service: Pulmonary;  Laterality: Right;   VIDEO BRONCHOSCOPY WITH ENDOBRONCHIAL ULTRASOUND Bilateral 03/02/2020   Procedure: VIDEO BRONCHOSCOPY WITH ENDOBRONCHIAL ULTRASOUND;  Surgeon: Brenna Adine CROME, DO;  Location: [facility] OR;  Service: Pulmonary;  Laterality: Bilateral;   WISDOM TOOTH EXTRACTION      REVIEW OF SYSTEMS:  Constitutional: positive for fatigue Eyes: negative Ears, nose, mouth, throat, and face: negative Respiratory: negative Cardiovascular: negative Gastrointestinal: negative Genitourinary:negative Integument/breast: negative Hematologic/lymphatic: negative Musculoskeletal:negative Neurological: negative Behavioral/Psych: negative Endocrine: negative Allergic/Immunologic: negative   PHYSICAL EXAMINATION: General appearance: alert, cooperative, and no distress Head: Normocephalic, without obvious abnormality, atraumatic Neck: no adenopathy, no JVD, supple, symmetrical, trachea midline, and thyroid not enlarged, symmetric, no tenderness/mass/nodules Lymph nodes: Cervical, supraclavicular, and axillary nodes normal. Resp: clear to auscultation bilaterally Back: symmetric, no curvature. ROM normal. No CVA tenderness. Cardio: regular rate and rhythm, S1, S2 normal, no murmur, click, rub or gallop GI: soft, non-tender; bowel sounds normal; no masses,  no  organomegaly Extremities: extremities normal, atraumatic, no cyanosis or edema Neurologic: Alert and oriented X 3, normal strength and tone. Normal symmetric reflexes. Normal coordination and gait  ECOG PERFORMANCE STATUS: 1 - Symptomatic but completely ambulatory  Blood pressure 136/80, pulse 65, temperature 97.6 F (36.4 C), temperature source Temporal, resp. rate 17, height 5' 11 (1.803 m), weight 236 lb (107 kg), SpO2 99%.  LABORATORY DATA: Lab Results  Component Value Date   WBC 7.4 10/14/2023   HGB 14.9 10/14/2023   HCT 43.4 10/14/2023   MCV 92.9 10/14/2023   PLT 212 10/14/2023      Chemistry      Component Value Date/Time   NA 140 10/22/2023 0852   NA 142 09/01/2021 0756   K 4.3 10/22/2023 0852   CL 104 10/22/2023 0852   CO2 31 10/22/2023 0852   BUN 19 10/22/2023 0852   BUN 20 09/01/2021 0756   CREATININE 1.07 10/22/2023 0852      Component Value Date/Time   CALCIUM  10.2 10/22/2023 0852   ALKPHOS 53 10/22/2023 0852   AST 26 10/22/2023 0852   ALT 46 (H) 10/22/2023 0852   BILITOT 0.6 10/22/2023 0852       RADIOGRAPHIC STUDIES: NM PET Image Restage (PS) Skull Base to Thigh (F-18 FDG) Result Date: 11/03/2023 EXAM: PET AND CT SKULL BASE TO MID THIGH 10/31/2023 01:05:41 PM TECHNIQUE: RADIOPHARMACEUTICAL: 12.4 mCi F-18 FDG Uptake time 60 minutes. Glucose level 99 mg/dl. PET imaging was acquired from the  base of the skull to the mid thighs. Non-contrast enhanced computed tomography was obtained for attenuation correction and anatomic localization. COMPARISON: CT chest 10/14/2023. CLINICAL HISTORY: Non-small cell lung cancer (NSCLC), staging. Restage PET for non-small cell lung cancer (NSCLC), staging, malignant neoplasm of unspecified part of unspecified bronchus or lung (HCC). Chemotherapy treatment prior to 2022. EOV. FINDINGS: HEAD AND NECK: Bilateral and symmetric tonsillar uptake. No tracer-avid lymph nodes within the soft tissues of the neck. CHEST: Status post right  upper lobe lobectomy. Nodule within the right middle lobe measures 1.1 cm and has an SUV max of 2.7 (axial image 84 of the fused PET CT images). This is slowly increased over time. On 03/19/2022, this nodule measured 4 mm. No metabolically active lymphadenopathy. ABDOMEN AND PELVIS: No metabolically active intraperitoneal mass. No metabolically active lymphadenopathy. Physiologic activity within the gastrointestinal and genitourinary systems. BONES AND SOFT TISSUE: No abnormal FDG activity localizes to the bones. No metabolically active aggressive osseous lesion. VASCULATURE: LAD coronary artery calcifications and abdominal aorta atherosclerotic calcifications. IMPRESSION: 1. Slowly enlarging 1.1 cm right middle lobe pulmonary nodule with low-level FDG uptake (SUV max 2.7), suspicious for neoplastic etiology given interval growth since 03/19/2022. Electronically signed by: Waddell Calk MD 11/03/2023 08:03 AM EDT RP Workstation: [id number]     ASSESSMENT AND PLAN: This is a very pleasant 48 years old white male recently diagnosed with stage IIA (T2b, N0, M0) non-small cell lung cancer, adenocarcinoma status post right upper lobectomy with lymph node sampling under the care of Dr. Shyrl on March 28, 2020. The patient had molecular studies by foundation 1 that showed no actionable mutation and his PD-L1 expression was 5%. The patient underwent adjuvant systemic chemotherapy with cisplatin  75 Mg/M2 and Alimta  500 Mg/M2.  Status post 4 cycles.  Cisplatin  was replaced with carboplatin  starting from cycle #3.  The patient tolerated his treatment well except for significant nausea and vomiting during the cisplatin  treatment. The patient is currently on observation and he is feeling fine with no concerning complaints. He had repeat CT scan of the chest performed recently.  I personally independently reviewed the scan images and discussed the results and showed the images to the patient and his wife.  His scan  showed further slight increase in the size of the right middle lobe nodule. He had a PET scan performed recently and that showed low-level FDG uptake in the slowly enlarging 1.1 cm right middle lobe pulmonary nodule still suspicious for neoplastic etiology given the interval growth. Assessment and Plan Assessment & Plan Suspicious right middle lobe lung nodule in patient with history of stage 2A right upper lobe lung adenocarcinoma, status post lobectomy and adjuvant chemotherapy PET scan indicates low activity in the right middle lobe nodule, but its growth and central location raise suspicion. Biopsy is challenging due to location. Given his lung cancer history, radiation treatment is considered appropriate. - Referred to radiation oncology for evaluation and potential radiation treatment without biopsy. - If radiation oncology requires biopsy, will refer to pulmonology for biopsy. - Scheduled follow-up appointment in four months to assess post-treatment status. The patient was advised to call immediately if he has any concerning symptoms in the interval.  The patient voices understanding of current disease status and treatment options and is in agreement with the current care plan. The total time spent in the appointment was 30 minutes.  All questions were answered. The patient knows to call the clinic with any problems, questions or concerns. We can certainly see the patient much  sooner if necessary.   Disclaimer: This note was dictated with voice recognition software. Similar sounding words can inadvertently be transcribed and may not be corrected upon review.

## 2023-11-15 ENCOUNTER — Telehealth: Payer: Self-pay | Admitting: Family Medicine

## 2023-11-15 NOTE — Telephone Encounter (Signed)
 Copied from CRM #[id number]. Topic: Referral - Status >> Nov 15, 2023 11:18 AM Viola FALCON wrote: Reason for CRM: Patient called to follow up on status of referrals to Gastroenterology/Opthalmology - neither office have the referrals. He had a PET scan done 10/31/23, depending on results he wants to know if colonoscopy is still needed? If so he would like the office to schedule his appts with Gastroenterology/Opthalmology any day except for a Tuesday or Wednesday. He's been waiting for 2 months. Please patient with update at [phone] (M)

## 2023-11-15 NOTE — Telephone Encounter (Signed)
 Referrals in system, can we resend? See note from patient.   (Does still need as the colonoscopy is just a screening)

## 2023-11-25 NOTE — Progress Notes (Signed)
 Location of tumor and Histology per Pathology Report:  Right middle lobe nodule   Biopsy:   Past/Anticipated interventions by surgeon, if any: right lung 2022     Past/Anticipated interventions by medical oncology, if any Dr. Gatha      Pain issues, if any:  {:18581} {PAIN DESCRIPTION:21022940}  SAFETY ISSUES: Prior radiation? {:18581} Pacemaker/ICD? {:18581} Possible current pregnancy? no Is the patient on methotrexate? {:18581}  Current Complaints / other details:  ***     ***

## 2023-11-26 NOTE — Progress Notes (Signed)
 Radiation Oncology         [phone] ________________________________  Initial Outpatient Consultation  Name: Jerry [name] MRN: [id number]  Date: 11/27/2023  DOB: May 02, 1975  RR:Azrx, Waddell NOVAK, NP  Sherrod Sherrod, MD   REFERRING PHYSICIAN: Sherrod Sherrod, MD  DIAGNOSIS: There were no encounter diagnoses.  Stage IIA (T2b, N0, M0) non-small cell lung cancer, adenocarcinoma   HISTORY OF PRESENT ILLNESS::Jerry [name] is a 48 y.o. male who is accompanied by ***. he is seen as a courtesy of Dr. Gatha for an opinion concerning radiation therapy as part of management for his recurrent lung cancer. Patient was diagnosed with stage 2A adenocarcinoma of the lung in February 2022 and underwent a right upper lobectomy with mediastinal lymph node sampling. This was followed by four cycles of adjuvant systemic chemotherapy with cisplatin  and Alimta , with the last dose administered in June 2022. Since then, he has been under observation under the care of Dr. Gatha    A recent CT chest performed on 10/14/23 showed an enlargement  in the medial segment right middle lobe nodule measures 6 x 11 mm compared to 6 x 8 mm on 05/16/2023 and from 3 mm on 03/19/2022.  Subsequently, a restaging PET scan was performed on 10/31/23 showing slowly enlarging 1.1 cm right middle lobe pulmonary nodule with low-level FDG uptake (SUV max 2.7), suspicious for neoplastic etiology given interval growth since 03/19/2022.  During his most recent follow up with Dr. Gatha on 11.6.25 to discuss further treatment plans. They opted to proceed with an evaluation with radiation oncology for a possible treatment without biopsy.    PREVIOUS RADIATION THERAPY: No  PAST MEDICAL HISTORY:  Past Medical History:  Diagnosis Date   Aortic atherosclerosis 08/05/2020   per chest CT   CAD (coronary artery disease), native coronary artery 08/05/2020   Left anterior descending CAD noted on chest CT   Disorder due to vaping     Former smoker    Intractable nausea and vomiting 05/11/2020   lung ca 01/2020   Otitis media 07/03/2021   Pneumonia    Sleep apnea     PAST SURGICAL HISTORY: Past Surgical History:  Procedure Laterality Date   INTERCOSTAL NERVE BLOCK Right 03/28/2020   Procedure: INTERCOSTAL NERVE BLOCK;  Surgeon: Shyrl Linnie KIDD, MD;  Location: [facility];  Service: Thoracic;  Laterality: Right;   LUNG LOBECTOMY Right 03/30/2020   LYMPH NODE DISSECTION Right 03/28/2020   Procedure: LYMPH NODE DISSECTION;  Surgeon: Shyrl Linnie KIDD, MD;  Location: [facility];  Service: Thoracic;  Laterality: Right;   VIDEO BRONCHOSCOPY WITH ENDOBRONCHIAL NAVIGATION Right 03/02/2020   Procedure: VIDEO BRONCHOSCOPY WITH ENDOBRONCHIAL NAVIGATION;  Surgeon: Brenna Adine CROME, DO;  Location: [facility];  Service: Pulmonary;  Laterality: Right;   VIDEO BRONCHOSCOPY WITH ENDOBRONCHIAL ULTRASOUND Bilateral 03/02/2020   Procedure: VIDEO BRONCHOSCOPY WITH ENDOBRONCHIAL ULTRASOUND;  Surgeon: Brenna Adine CROME, DO;  Location: [facility];  Service: Pulmonary;  Laterality: Bilateral;   WISDOM TOOTH EXTRACTION      FAMILY HISTORY:  Family History  Problem Relation Age of Onset   Cancer Mother        breast   Stroke Father    Hypertension Father    Hyperlipidemia Father    Arrhythmia Father    COPD Father    Hearing loss Father    Vision loss Father    Ulcerative colitis Father    Hypertension Maternal Grandmother    Cancer Maternal Grandmother        breast  Vision loss Maternal Grandmother    Early death Paternal Grandmother 27       stroke   Early death Paternal Grandfather 77       heart attack   Lung cancer Paternal Aunt    Cancer Paternal Aunt        breast, colon   Sleep apnea Neg Hx     SOCIAL HISTORY:  Social History   Tobacco Use   Smoking status: Former    Current packs/day: 0.00    Average packs/day: 1 pack/day for 17.0 years (17.0 ttl pk-yrs)    Types: Cigarettes    Start date: 03/01/1991    Quit date:  02/29/2008    Years since quitting: 15.7   Smokeless tobacco: Never  Vaping Use   Vaping status: Former   Start date: 02/08/2013   Quit date: 02/29/2020   Substances: Nicotine  Substance Use Topics   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   Drug use: Never    ALLERGIES:  Allergies  Allergen Reactions   Olanzapine  Other (See Comments)    Nightmares, night sweats    MEDICATIONS:  Current Outpatient Medications  Medication Sig Dispense Refill   desonide  (DESOWEN ) 0.05 % cream Apply topically 2 (two) times daily. Apply to eyelid twice daily (Patient taking differently: Apply topically as needed. Apply to eyelid twice daily) 30 g 0   magnesium  oxide (MAG-OX) 400 MG tablet Take 1 tablet by mouth daily.     Multiple Vitamins-Minerals (ONE DAILY MULTIVITAMIN MEN) TABS Take 1 tablet by mouth daily.     triamcinolone  cream (KENALOG ) 0.1 % Apply 1 application. topically 2 (two) times daily. (Patient taking differently: Apply 1 application  topically as needed.) 30 g 0   No current facility-administered medications for this visit.   Facility-Administered Medications Ordered in Other Visits  Medication Dose Route Frequency Provider Last Rate Last Admin   0.9 %  sodium chloride  infusion   Intravenous Continuous Sherrod Sherrod, MD       0.9 %  sodium chloride  infusion   Intravenous Continuous Sherrod Sherrod, MD 900 mL/hr at 05/11/20 0940 New Bag at 05/11/20 0940   ondansetron  (ZOFRAN ) injection 8 mg  8 mg Intravenous Once Bell, Diane H, RN        REVIEW OF SYSTEMS:  A 10+ POINT REVIEW OF SYSTEMS WAS OBTAINED including neurology, dermatology, psychiatry, cardiac, respiratory, lymph, extremities, GI, GU, musculoskeletal, constitutional, reproductive, HEENT. ***   PHYSICAL EXAM:  vitals were not taken for this visit.   General: Alert and oriented, in no acute distress HEENT: Head is normocephalic. Extraocular movements are intact. Oropharynx is clear. Neck: Neck is supple,  no palpable cervical or supraclavicular lymphadenopathy. Heart: Regular in rate and rhythm with no murmurs, rubs, or gallops. Chest: Clear to auscultation bilaterally, with no rhonchi, wheezes, or rales. Abdomen: Soft, nontender, nondistended, with no rigidity or guarding. Extremities: No cyanosis or edema. Lymphatics: see Neck Exam Skin: No concerning lesions. Musculoskeletal: symmetric strength and muscle tone throughout. Neurologic: Cranial nerves II through XII are grossly intact. No obvious focalities. Speech is fluent. Coordination is intact. Psychiatric: Judgment and insight are intact. Affect is appropriate. ***  ECOG = ***  0 - Asymptomatic (Fully active, able to carry on all predisease activities without restriction)  1 - Symptomatic but completely ambulatory (Restricted in physically strenuous activity but ambulatory and able to carry out work of a light or sedentary nature. For example, light housework, office work)  2 - Symptomatic, <  50% in bed during the day (Ambulatory and capable of all self care but unable to carry out any work activities. Up and about more than 50% of waking hours)  3 - Symptomatic, >50% in bed, but not bedbound (Capable of only limited self-care, confined to bed or chair 50% or more of waking hours)  4 - Bedbound (Completely disabled. Cannot carry on any self-care. Totally confined to bed or chair)  5 - Death   Raylene MM, Creech RH, Tormey DC, et al. [phone]). Toxicity and response criteria of the [facility]. Am. DOROTHA Bridges. Oncol. 5 (6): 649-55  LABORATORY DATA:  Lab Results  Component Value Date   WBC 7.4 10/14/2023   HGB 14.9 10/14/2023   HCT 43.4 10/14/2023   MCV 92.9 10/14/2023   PLT 212 10/14/2023   NEUTROABS 5.3 10/14/2023   Lab Results  Component Value Date   NA 140 10/22/2023   K 4.3 10/22/2023   CL 104 10/22/2023   CO2 31 10/22/2023   GLUCOSE 104 (H) 10/22/2023   BUN 19 10/22/2023   CREATININE 1.07  10/22/2023   CALCIUM  10.2 10/22/2023      RADIOGRAPHY: NM PET Image Restage (PS) Skull Base to Thigh (F-18 FDG) Result Date: 11/03/2023 EXAM: PET AND CT SKULL BASE TO MID THIGH 10/31/2023 01:05:41 PM TECHNIQUE: RADIOPHARMACEUTICAL: 12.4 mCi F-18 FDG Uptake time 60 minutes. Glucose level 99 mg/dl. PET imaging was acquired from the base of the skull to the mid thighs. Non-contrast enhanced computed tomography was obtained for attenuation correction and anatomic localization. COMPARISON: CT chest 10/14/2023. CLINICAL HISTORY: Non-small cell lung cancer (NSCLC), staging. Restage PET for non-small cell lung cancer (NSCLC), staging, malignant neoplasm of unspecified part of unspecified bronchus or lung (HCC). Chemotherapy treatment prior to 2022. EOV. FINDINGS: HEAD AND NECK: Bilateral and symmetric tonsillar uptake. No tracer-avid lymph nodes within the soft tissues of the neck. CHEST: Status post right upper lobe lobectomy. Nodule within the right middle lobe measures 1.1 cm and has an SUV max of 2.7 (axial image 84 of the fused PET CT images). This is slowly increased over time. On 03/19/2022, this nodule measured 4 mm. No metabolically active lymphadenopathy. ABDOMEN AND PELVIS: No metabolically active intraperitoneal mass. No metabolically active lymphadenopathy. Physiologic activity within the gastrointestinal and genitourinary systems. BONES AND SOFT TISSUE: No abnormal FDG activity localizes to the bones. No metabolically active aggressive osseous lesion. VASCULATURE: LAD coronary artery calcifications and abdominal aorta atherosclerotic calcifications. IMPRESSION: 1. Slowly enlarging 1.1 cm right middle lobe pulmonary nodule with low-level FDG uptake (SUV max 2.7), suspicious for neoplastic etiology given interval growth since 03/19/2022. Electronically signed by: Waddell Calk MD 11/03/2023 08:03 AM EDT RP Workstation: [id number]      IMPRESSION: Stage IIA (T2b, N0, M0) non-small cell lung cancer,  adenocarcinoma presented with right upper lobe lung mass diagnosed in February 2022.   ***  Today, I talked to the patient and family about the findings and work-up thus far.  We discussed the natural history of *** and general treatment, highlighting the role of radiotherapy in the management.  We discussed the available radiation techniques, and focused on the details of logistics and delivery.  We reviewed the anticipated acute and late sequelae associated with radiation in this setting.  The patient was encouraged to ask questions that I answered to the best of my ability. *** A patient consent form was discussed and signed.  We retained a copy for our records.  The patient would like to proceed  with radiation and will be scheduled for CT simulation.  PLAN: ***    *** minutes of total time was spent for this patient encounter, including preparation, face-to-face counseling with the patient and coordination of care, physical exam, and documentation of the encounter.   ------------------------------------------------  Lynwood CHARM Nasuti, PhD, MD  This document serves as a record of services personally performed by Lynwood Nasuti, MD. It was created on his behalf by Reymundo Cartwright, a trained medical scribe. The creation of this record is based on the scribe's personal observations and the provider's statements to them. This document has been checked and approved by the attending provider.

## 2023-11-27 ENCOUNTER — Ambulatory Visit
Admission: RE | Admit: 2023-11-27 | Discharge: 2023-11-27 | Disposition: A | Discharge status: Home / Self Care | Source: Ambulatory Visit | Attending: Radiation Oncology | Admitting: Radiation Oncology

## 2023-11-27 ENCOUNTER — Encounter: Payer: Self-pay | Admitting: Radiation Oncology

## 2023-11-27 VITALS — BP 148/80 | HR 55 | Temp 96.9°F | Resp 18 | Ht 71.0 in | Wt 246.2 lb

## 2023-11-27 DIAGNOSIS — Z87891 Personal history of nicotine dependence: Secondary | ICD-10-CM | POA: Diagnosis not present

## 2023-11-27 DIAGNOSIS — C342 Malignant neoplasm of middle lobe, bronchus or lung: Secondary | ICD-10-CM

## 2023-11-27 DIAGNOSIS — I2510 Atherosclerotic heart disease of native coronary artery without angina pectoris: Secondary | ICD-10-CM | POA: Insufficient documentation

## 2023-11-27 DIAGNOSIS — Z803 Family history of malignant neoplasm of breast: Secondary | ICD-10-CM | POA: Diagnosis not present

## 2023-11-27 DIAGNOSIS — G4730 Sleep apnea, unspecified: Secondary | ICD-10-CM | POA: Diagnosis not present

## 2023-11-27 DIAGNOSIS — Z801 Family history of malignant neoplasm of trachea, bronchus and lung: Secondary | ICD-10-CM | POA: Insufficient documentation

## 2023-11-27 DIAGNOSIS — Z8701 Personal history of pneumonia (recurrent): Secondary | ICD-10-CM | POA: Insufficient documentation

## 2023-11-27 DIAGNOSIS — I700 Atherosclerosis of aorta: Secondary | ICD-10-CM | POA: Diagnosis not present

## 2023-11-27 DIAGNOSIS — C3491 Malignant neoplasm of unspecified part of right bronchus or lung: Secondary | ICD-10-CM

## 2023-12-09 ENCOUNTER — Ambulatory Visit: Admitting: Family Medicine

## 2023-12-09 ENCOUNTER — Encounter: Payer: Self-pay | Admitting: Family Medicine

## 2023-12-09 VITALS — BP 131/71 | HR 54 | Ht 71.0 in | Wt 251.0 lb

## 2023-12-09 DIAGNOSIS — Z3009 Encounter for other general counseling and advice on contraception: Secondary | ICD-10-CM | POA: Diagnosis not present

## 2023-12-09 NOTE — Progress Notes (Signed)
 Acute Office Visit  Subjective:  Patient ID: Jerry [name], male    DOB: [birth year]  Age: 48 y.o. MRN: [id number]  CC:  Chief Complaint  Patient presents with   Medical Management of Chronic Issues      HPI Jerry [name] is here to discuss a vasectomy. Reports he and his wife are done having children and they have agreed for him to get a vasectomy. He would like a referral to get this set up. No acute concerns. He was proud to show pictures of his new grandbaby who was born a few weeks ago.          Past Medical History:  Diagnosis Date   Aortic atherosclerosis 08/05/2020   per chest CT   CAD (coronary artery disease), native coronary artery 08/05/2020   Left anterior descending CAD noted on chest CT   Disorder due to vaping    Former smoker    Intractable nausea and vomiting 05/11/2020   lung ca 01/2020   Otitis media 07/03/2021   Pneumonia    Sleep apnea     Past Surgical History:  Procedure Laterality Date   INTERCOSTAL NERVE BLOCK Right 03/28/2020   Procedure: INTERCOSTAL NERVE BLOCK;  Surgeon: Shyrl Linnie KIDD, MD;  Location: [facility];  Service: Thoracic;  Laterality: Right;   LUNG LOBECTOMY Right 03/30/2020   LYMPH NODE DISSECTION Right 03/28/2020   Procedure: LYMPH NODE DISSECTION;  Surgeon: Shyrl Linnie KIDD, MD;  Location: [facility];  Service: Thoracic;  Laterality: Right;   VIDEO BRONCHOSCOPY WITH ENDOBRONCHIAL NAVIGATION Right 03/02/2020   Procedure: VIDEO BRONCHOSCOPY WITH ENDOBRONCHIAL NAVIGATION;  Surgeon: Brenna Adine CROME, DO;  Location: [facility];  Service: Pulmonary;  Laterality: Right;   VIDEO BRONCHOSCOPY WITH ENDOBRONCHIAL ULTRASOUND Bilateral 03/02/2020   Procedure: VIDEO BRONCHOSCOPY WITH ENDOBRONCHIAL ULTRASOUND;  Surgeon: Brenna Adine CROME, DO;  Location: [facility];  Service: Pulmonary;  Laterality: Bilateral;   WISDOM TOOTH EXTRACTION      Family History  Problem Relation Age of Onset   Cancer Mother        breast   Stroke Father    Hypertension  Father    Hyperlipidemia Father    Arrhythmia Father    COPD Father    Hearing loss Father    Vision loss Father    Ulcerative colitis Father    Hypertension Maternal Grandmother    Cancer Maternal Grandmother        breast   Vision loss Maternal Grandmother    Early death Paternal Grandmother 91       stroke   Early death Paternal Grandfather 71       heart attack   Lung cancer Paternal Aunt    Cancer Paternal Aunt        breast, colon   Sleep apnea Neg Hx     Social History   Socioeconomic History   Marital status: Married    Spouse name: Not on file   Number of children: 6   Years of education: Not on file   Highest education level: Some college, no degree  Occupational History   Occupation: Personnel Officer  Tobacco Use   Smoking status: Former    Current packs/day: 0.00    Average packs/day: 1 pack/day for 17.0 years (17.0 ttl pk-yrs)    Types: Cigarettes    Start date: 03/01/1991    Quit date: 02/29/2008    Years since quitting: 15.7   Smokeless tobacco: Never  Vaping Use   Vaping status: Former  Start date: 02/08/2013   Quit date: 02/29/2020   Substances: Nicotine  Substance and Sexual Activity   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   Drug use: Never   Sexual activity: Yes    Partners: Female  Other Topics Concern   Not on file  Social History Narrative   Not on file   Social Drivers of Health   Financial Resource Strain: Medium Risk (09/17/2023)   Overall Financial Resource Strain (CARDIA)    Difficulty of Paying Living Expenses: Somewhat hard  Food Insecurity: No Food Insecurity (11/27/2023)   Hunger Vital Sign    Worried About Running Out of Food in the Last Year: Never true    Ran Out of Food in the Last Year: Never true  Transportation Needs: No Transportation Needs (11/27/2023)   PRAPARE - Administrator, Civil Service (Medical): No    Lack of Transportation (Non-Medical): No  Physical Activity: Insufficiently Active  (09/17/2023)   Exercise Vital Sign    Days of Exercise per Week: 3 days    Minutes of Exercise per Session: 30 min  Stress: No Stress Concern Present (09/17/2023)   [employer] of Occupational Health - Occupational Stress Questionnaire    Feeling of Stress: Not at all  Social Connections: Socially Integrated (09/17/2023)   Social Connection and Isolation Panel    Frequency of Communication with Friends and Family: More than three times a week    Frequency of Social Gatherings with Friends and Family: Patient declined    Attends Religious Services: More than 4 times per year    Active Member of [employer] or Organizations: Yes    Attends Engineer, Structural: More than 4 times per year    Marital Status: Married  Catering Manager Violence: Not At Risk (11/27/2023)   Humiliation, Afraid, Rape, and Kick questionnaire    Fear of Current or Ex-Partner: No    Emotionally Abused: No    Physically Abused: No    Sexually Abused: No    ROS All ROS negative except what is listed in the HPI.   Objective:   Today's Vitals: BP 131/71   Pulse (!) 54   Ht 5' 11 (1.803 m)   Wt 251 lb (113.9 kg)   SpO2 97%   BMI 35.01 kg/m   Physical Exam Vitals reviewed.  Constitutional:      General: He is not in acute distress.    Appearance: Normal appearance. He is not ill-appearing.  Neurological:     Mental Status: He is alert and oriented to person, place, and time.  Psychiatric:        Mood and Affect: Mood normal.        Thought Content: Thought content normal.        Judgment: Judgment normal.     Assessment & Plan:   Problem List Items Addressed This Visit   None Visit Diagnoses       Encounter for vasectomy counseling    -  Primary No concerns. Referral placed.    Relevant Orders   Ambulatory referral to Urology         Follow-up: Return for physical at your convenience .   Waddell FURY Almarie, DNP, FNP-C  I,Emily Lagle,acting as a neurosurgeon for Waddell KATHEE Almarie, NP.,have  documented all relevant documentation on the behalf of Waddell KATHEE Almarie, NP.   I, Waddell KATHEE Almarie, NP, have reviewed all documentation for this visit. The documentation on 12/09/2023 for  the exam, diagnosis, procedures, and orders are all accurate and complete.

## 2023-12-10 ENCOUNTER — Ambulatory Visit
Admission: RE | Admit: 2023-12-10 | Discharge: 2023-12-10 | Disposition: A | Discharge status: Home / Self Care | Source: Ambulatory Visit | Attending: Radiation Oncology | Admitting: Radiation Oncology

## 2023-12-10 DIAGNOSIS — C342 Malignant neoplasm of middle lobe, bronchus or lung: Secondary | ICD-10-CM | POA: Insufficient documentation

## 2023-12-10 DIAGNOSIS — Z87891 Personal history of nicotine dependence: Secondary | ICD-10-CM | POA: Diagnosis not present

## 2023-12-10 DIAGNOSIS — Z510 Encounter for antineoplastic radiation therapy: Secondary | ICD-10-CM | POA: Insufficient documentation

## 2023-12-16 DIAGNOSIS — C342 Malignant neoplasm of middle lobe, bronchus or lung: Secondary | ICD-10-CM | POA: Diagnosis not present

## 2023-12-16 DIAGNOSIS — Z510 Encounter for antineoplastic radiation therapy: Secondary | ICD-10-CM | POA: Diagnosis not present

## 2023-12-16 DIAGNOSIS — Z87891 Personal history of nicotine dependence: Secondary | ICD-10-CM | POA: Diagnosis not present

## 2023-12-18 ENCOUNTER — Ambulatory Visit: Admitting: Radiation Oncology

## 2023-12-19 ENCOUNTER — Ambulatory Visit: Admitting: Radiation Oncology

## 2023-12-19 ENCOUNTER — Other Ambulatory Visit: Payer: Self-pay

## 2023-12-19 ENCOUNTER — Ambulatory Visit
Admission: RE | Admit: 2023-12-19 | Discharge: 2023-12-19 | Attending: Radiation Oncology | Admitting: Radiation Oncology

## 2023-12-19 DIAGNOSIS — C342 Malignant neoplasm of middle lobe, bronchus or lung: Secondary | ICD-10-CM | POA: Diagnosis not present

## 2023-12-19 DIAGNOSIS — Z510 Encounter for antineoplastic radiation therapy: Secondary | ICD-10-CM | POA: Diagnosis not present

## 2023-12-19 LAB — RAD ONC ARIA SESSION SUMMARY
Course Elapsed Days: 0
Plan Fractions Treated to Date: 1
Plan Prescribed Dose Per Fraction: 18 Gy
Plan Total Fractions Prescribed: 3
Plan Total Prescribed Dose: 54 Gy
Reference Point Dosage Given to Date: 18 Gy
Reference Point Session Dosage Given: 18 Gy
Session Number: 1

## 2023-12-20 ENCOUNTER — Ambulatory Visit: Admitting: Radiation Oncology

## 2023-12-20 ENCOUNTER — Ambulatory Visit

## 2023-12-23 ENCOUNTER — Ambulatory Visit

## 2023-12-23 ENCOUNTER — Ambulatory Visit: Admitting: Radiation Oncology

## 2023-12-23 ENCOUNTER — Other Ambulatory Visit: Payer: Self-pay

## 2023-12-23 ENCOUNTER — Ambulatory Visit
Admission: RE | Admit: 2023-12-23 | Discharge: 2023-12-23 | Attending: Radiation Oncology | Admitting: Radiation Oncology

## 2023-12-23 DIAGNOSIS — Z510 Encounter for antineoplastic radiation therapy: Secondary | ICD-10-CM | POA: Diagnosis not present

## 2023-12-23 DIAGNOSIS — C342 Malignant neoplasm of middle lobe, bronchus or lung: Secondary | ICD-10-CM | POA: Diagnosis not present

## 2023-12-23 LAB — RAD ONC ARIA SESSION SUMMARY
Course Elapsed Days: 4
Plan Fractions Treated to Date: 2
Plan Prescribed Dose Per Fraction: 18 Gy
Plan Total Fractions Prescribed: 3
Plan Total Prescribed Dose: 54 Gy
Reference Point Dosage Given to Date: 36 Gy
Reference Point Session Dosage Given: 18 Gy
Session Number: 2

## 2023-12-24 ENCOUNTER — Ambulatory Visit: Admitting: Radiation Oncology

## 2023-12-25 ENCOUNTER — Ambulatory Visit: Admitting: Radiation Oncology

## 2023-12-25 ENCOUNTER — Ambulatory Visit

## 2023-12-26 ENCOUNTER — Ambulatory Visit: Admission: RE | Admit: 2023-12-26 | Admitting: Radiation Oncology

## 2023-12-26 ENCOUNTER — Other Ambulatory Visit: Payer: Self-pay

## 2023-12-26 ENCOUNTER — Ambulatory Visit: Admitting: Radiation Oncology

## 2023-12-26 DIAGNOSIS — C342 Malignant neoplasm of middle lobe, bronchus or lung: Secondary | ICD-10-CM | POA: Diagnosis not present

## 2023-12-26 DIAGNOSIS — Z510 Encounter for antineoplastic radiation therapy: Secondary | ICD-10-CM | POA: Diagnosis not present

## 2023-12-26 DIAGNOSIS — Z87891 Personal history of nicotine dependence: Secondary | ICD-10-CM | POA: Diagnosis not present

## 2023-12-26 LAB — RAD ONC ARIA SESSION SUMMARY
Course Elapsed Days: 7
Plan Fractions Treated to Date: 3
Plan Prescribed Dose Per Fraction: 18 Gy
Plan Total Fractions Prescribed: 3
Plan Total Prescribed Dose: 54 Gy
Reference Point Dosage Given to Date: 54 Gy
Reference Point Session Dosage Given: 18 Gy
Session Number: 3

## 2023-12-27 NOTE — Radiation Completion Notes (Addendum)
 Patient Name: Jerry [name], NASTASI MRN: [id number] Date of Birth: September 29, 1975 Referring Physician: SHERROD SHERROD, M.D. Date of Service: 2023-12-27 Radiation Oncologist: Signe Nasuti, M.D. [facility] [place]                             RADIATION ONCOLOGY END OF TREATMENT NOTE     Diagnosis: C34.2 Malignant neoplasm of middle lobe, bronchus or lung Staging on 2020-03-14: Adenocarcinoma of right lung, stage 2 (HCC) T=cT2b, N=cN0, M=cM0 Intent: Curative     ==========DELIVERED PLANS==========  First Treatment Date: 2023-12-19 Last Treatment Date: 2023-12-26   Plan Name: Lung_R_SBRT Site: Lung, Right Technique: SBRT/SRT-IMRT Mode: Photon Dose Per Fraction: 18 Gy Prescribed Dose (Delivered / Prescribed): 54 Gy / 54 Gy Prescribed Fxs (Delivered / Prescribed): 3 / 3     ==========ON TREATMENT VISIT DATES========== 2023-12-19, 2023-12-23, 2023-12-26, 2023-12-26     ==========UPCOMING VISITS========== 03/12/2024 [facility] EST PT 15 Sherrod Sherrod, MD  03/05/2024 [facility] LAB [facility]  01/30/2024 [facility] ONC FOLLOW UP 20 Jomarie Agent, MD  01/20/2024 [facility] NEW PATIENT Stoneking, Adine PARAS., MD        ==========APPENDIX - ON TREATMENT VISIT NOTES==========   See weekly On Treatment Notes in Epic for details in the Media tab (listed as Progress notes on the On Treatment Visit Dates listed above).

## 2023-12-30 ENCOUNTER — Ambulatory Visit: Admitting: Radiation Oncology

## 2024-01-01 ENCOUNTER — Ambulatory Visit: Admitting: Radiation Oncology

## 2024-01-20 ENCOUNTER — Encounter: Payer: Self-pay | Admitting: Urology

## 2024-01-20 ENCOUNTER — Ambulatory Visit: Admitting: Urology

## 2024-01-20 VITALS — BP 137/83 | HR 62 | Ht 71.0 in | Wt 241.0 lb

## 2024-01-20 DIAGNOSIS — Z3009 Encounter for other general counseling and advice on contraception: Secondary | ICD-10-CM

## 2024-01-20 MED ORDER — ALPRAZOLAM 1 MG PO TABS: ORAL_TABLET | ORAL | 0 refills | Status: AC

## 2024-01-20 NOTE — Patient Instructions (Signed)
 Vasectomy is a safe, simple, and effective office based procedure that provides men with permanent sterility.  The no scalpel technique has been a refinement but often results in less swelling and pain than the traditional vasectomy method.  Prior to a vasectomy, it is important to make a decision that you are interested in permanent sterility and that you and your partner must be completely sure that you do not want children in the future.  To prepare for the procedure, stop taking any aspirin or blood thinners for 1 week prior to the procedure.  The day of your procedure take a shower and thoroughly clean your scrotum.  It is not necessary to shave prior to the procedure as any shaving that is needed will be performed in the office.  Bring a pair of tight underwear such as briefs, boxer briefs, or athletic shorts with you for the day of the procedure.  You may eat a light meal prior to the procedure.  During the procedure, the scrotal skin will be sterilized completely.  Anesthesia will be provided by injection of a local anesthetic into the scrotum which will provide anesthesia for 2-3 hours after the procedure.  Once the local anesthetic takes effect, a tiny puncture is made in the middle of the front of the scrotum through which both vasa deferens tubes can be partially removed and the ends of the tubes cauterized.  A small absorbable suture is placed in the skin incision.  Antibiotic ointment and sterile gauze dressings are applied and are held in place with the undergarment.  Prescriptions for an antibiotic and pain medication will be sent to your pharmacy.  The antibiotic should be taken to completion.  The pain medication can be taken as needed every 4-6 hours.  If a narcotic pain medication is too strong, over-the-counter analgesics such as Tylenol or ibuprofen may be taken instead.  After the procedure, it is important to take it easy for a couple of days and apply the ice pack to the scrotal area.   The ice pack goes on top of the undergarment and should be used for 10-15 minutes at a time while you are awake.  After the first 2 days, you can gradually increase your activity.  During the first week, it is important to avoid strenuous activity or activity that puts pressure in the scrotal area.  It is also advisable to restrain from heavy lifting or long distance running during this time period.  Often, supportive underwear is helpful to reduce pain during the first week.  You should also avoid sexual activity for 10 days.  You can resume normal activities after 1 week and sexual relations in 10 days.  One of the most important considerations after vasectomy is that you are still fertile after the procedure.  It generally takes at least 20 ejaculations and 3 months time before you are considered sterile.  Even 3 months after the procedure, some men will have a few persistent sperm present.  To be considered sterile, you will need to produce a semen sample that shows no sperm.  You will receive instructions to bring in your first semen specimen to the office 3 months after the procedure.  It is very important that you continue to use your current method of birth control during this time as it is possible to achieve a pregnancy until you become sterile.  Potential complications of vasectomy include bleeding (less than 1% risk of serious bleeding), infection (less than 1%), reconnection of  the vas deferens (01/998 chance), pregnancy (01/1998 chance), shrinkage of the testicle (01/4998 chance), and chronic pain (2-4%).  Other potential consequences of vasectomy include sperm granuloma (a small round area of scar tissue in the area of the procedure is performed), and congestion of the epididymis (a fullness of the tubes where sperm are stored).  Sperm will continue to be produced by the testicles, but the sperm will eventually die and be absorbed by the body.  The amount of semen that is produced will not change.   The main difference is that the semen will not contain sperm after a man has become sterile.  The procedure does not affect your urination, sex drive, or erections.  In summary, vasectomy provides permanent birth control for men.  It is an office-based procedure which is safe, effective, and economical.  After the procedure, it takes time to become sterile so proper precautions must be taken until sterility is achieved.    Taking care of yourself after a VASECTOMY                                              Patient Information Sheet        The following information will reinforce some of the instructions that your doctor has given you.  Day of Procedure: 1) Wear the scrotal supporter and gauze pad 2) Use an ice pack on the scrotum for 15 minutes every hour for 48 hours to help reduce discomfort, swelling and bruising (do NOT place ice directly on your skin, but place on top of the supporter) 3) Expect some clear to pinkish drainage at the surgical site for the first 24-48 hours 4) If needed, use pain medications provided or ibuprofen 800 mg every 8 hours for discomfort 5) Avoid strenuous activities like mowing, lifting, jogging and exercising for 1 week.  Take it easy! 6) If you develop a fever over 101 F or sudden onset of significant swelling within the first 12 hours, please call to report this to your doctor as soon as possible.     Day Two and Three: 1) You may take a shower, but avoid tubs, pools or hot tubs. 2) Continue to wear the scrotal supporter as needed for comfort and change or remove the gauze pad if desired 3) Keep taking it easy!  Avoid strenuous activities like mowing, lifting, jogging and exercising.   4) Continue to watch for signs or symptoms of fever or significant swelling 5) Apply a small amount of antibiotic ointment to incision 1-2 times/day  The rest of the week: 1) Gradually return to normal physical activities after one week.  A return of soreness might  mean you are        "doing too much too soon". 2) Avoid sexual activity for 10 days after the procedure 3) Continue to take a shower, but avoid tubs, pools or hot tubs 4) Wearing the scrotal supporter is optional based on your comfort.     Remember to use an alternate form of contraception for 3 months until you have been checked and CLEARED by your urologist!  62-Month lab appointment:  1) The lab technician will need to look at a semen sample under a microscope  2) Use the specimen cup provided to collect the sample AT HOME 1 hour before the appointment  3) DO NOT refrigerate the specimen, but keep  at room or body temperature  4) Avoid ejaculation for 2-5 days before collecting the specimen  5) Collect the entire specimen by masturbation using NO lubricant  6) Make sure your name, MR number, date and time of collection are on the cup

## 2024-01-20 NOTE — Progress Notes (Signed)
 "  Assessment: 1. Encounter for vasectomy assessment      Plan: Schedule for vasectomy per patient request Rx for alprazolam  1 mg pre-procedure provided.   Chief Complaint:  Chief Complaint  Patient presents with   VAS Consult    History of Present Illness:  Jerry [name] is a 49 y.o. male who is seen for vasectomy evaluation. He is married with 6 children.  No history of scrotal trauma or infection.  Past Medical History:  Past Medical History:  Diagnosis Date   Aortic atherosclerosis 08/05/2020   per chest CT   CAD (coronary artery disease), native coronary artery 08/05/2020   Left anterior descending CAD noted on chest CT   Disorder due to vaping    Former smoker    Intractable nausea and vomiting 05/11/2020   lung ca 01/2020   Otitis media 07/03/2021   Pneumonia    Sleep apnea     Past Surgical History:  Past Surgical History:  Procedure Laterality Date   INTERCOSTAL NERVE BLOCK Right 03/28/2020   Procedure: INTERCOSTAL NERVE BLOCK;  Surgeon: Shyrl Linnie KIDD, MD;  Location: [facility] OR;  Service: Thoracic;  Laterality: Right;   LUNG LOBECTOMY Right 03/30/2020   LYMPH NODE DISSECTION Right 03/28/2020   Procedure: LYMPH NODE DISSECTION;  Surgeon: Shyrl Linnie KIDD, MD;  Location: [facility] OR;  Service: Thoracic;  Laterality: Right;   VIDEO BRONCHOSCOPY WITH ENDOBRONCHIAL NAVIGATION Right 03/02/2020   Procedure: VIDEO BRONCHOSCOPY WITH ENDOBRONCHIAL NAVIGATION;  Surgeon: Brenna Adine CROME, DO;  Location: [facility] OR;  Service: Pulmonary;  Laterality: Right;   VIDEO BRONCHOSCOPY WITH ENDOBRONCHIAL ULTRASOUND Bilateral 03/02/2020   Procedure: VIDEO BRONCHOSCOPY WITH ENDOBRONCHIAL ULTRASOUND;  Surgeon: Brenna Adine CROME, DO;  Location: [facility] OR;  Service: Pulmonary;  Laterality: Bilateral;   WISDOM TOOTH EXTRACTION      Allergies:  Allergies[1]  Family History:  Family History  Problem Relation Age of Onset   Cancer Mother        breast   Stroke Father    Hypertension Father     Hyperlipidemia Father    Arrhythmia Father    COPD Father    Hearing loss Father    Vision loss Father    Ulcerative colitis Father    Hypertension Maternal Grandmother    Cancer Maternal Grandmother        breast   Vision loss Maternal Grandmother    Early death Paternal Grandmother 71       stroke   Early death Paternal Grandfather 37       heart attack   Lung cancer Paternal Aunt    Cancer Paternal Aunt        breast, colon   Sleep apnea Neg Hx     Social History:  Social History[2]  Review of symptoms:  Constitutional:  Negative for unexplained weight loss, night sweats, fever, chills ENT:  Negative for nose bleeds, sinus pain, painful swallowing CV:  Negative for chest pain, shortness of breath, exercise intolerance, palpitations, loss of consciousness Resp:  Negative for cough, wheezing, shortness of breath GI:  Negative for nausea, vomiting, diarrhea, bloody stools GU:  Positives noted in HPI; otherwise negative for gross hematuria, dysuria, urinary incontinence Neuro:  Negative for seizures, poor balance, limb weakness, slurred speech Psych:  Negative for lack of energy, depression, anxiety Endocrine:  Negative for polydipsia, polyuria, symptoms of hypoglycemia (dizziness, hunger, sweating) Hematologic:  Negative for anemia, purpura, petechia, prolonged or excessive bleeding, use of anticoagulants  Allergic:  Negative for difficulty breathing or choking  as a result of exposure to anything; no shellfish allergy; no allergic response (rash/itch) to materials, foods  Physical exam: BP 137/83   Pulse 62   Ht 5' 11 (1.803 m)   Wt 241 lb (109.3 kg)   BMI 33.61 kg/m  GENERAL APPEARANCE:  Well appearing, well developed, well nourished, NAD HEENT:  Atraumatic, normocephalic, oropharynx clear NECK:  Supple without lymphadenopathy or thyromegaly ABDOMEN:  Soft, non-tender, no masses EXTREMITIES:  Moves all extremities well, without clubbing, cyanosis, or  edema NEUROLOGIC:  Alert and oriented x 3, normal gait, CN II-XII grossly intact MENTAL STATUS:  appropriate BACK:  Non-tender to palpation, No CVAT SKIN:  Warm, dry, and intact GU: Penis:  circumcised Meatus: Normal Scrotum: thickened cords bilaterally, vas palpated bilaterally Testis: normal without masses bilateral Epididymis: normal  Results: None  VASECTOMY CONSULTATION  Jerry [name] presents for vasectomy consultation today.  He is a 49 y.o. male, Married with 6 children.  He and his wife have discussed the issues regarding long-term fertility and are comfortable with this decision.  He presents for consideration for vasectomy.  I discussed the issues in detail with him today and he expressed no reservations.  As to the procedure, no scalpel technique vasectomy is explained and reviewed in detail.  Generalized risks including but not limited to bleeding, infection, orchalgia, testicular atrophy, epididymitis, scrotal hematoma, and chronic pain are discussed.   Additionally, he understands that the possibility of vas recanalization following vasectomy is possible although rare.  Most importantly, the patient understands that he is not sterile initially and will need a semen analysis check to confirm sterility such that no sperm are seen.  He is advised to avoid ejaculation for 10 days following the procedure.  The initial semen analysis will be checked in approximately 12 weeks and in some patients, several months may be required for clearance of all sperm.  He reports a clear understanding of the need for continued birth control until sterility is confirmed.  Otherwise, general issues regarding local anesthesia, prep, alprazolam  are discussed and he reports a clear understanding.       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   Drug use: Never   "

## 2024-01-30 ENCOUNTER — Ambulatory Visit: Admitting: Radiation Oncology

## 2024-02-07 ENCOUNTER — Telehealth: Payer: Self-pay

## 2024-02-07 NOTE — Telephone Encounter (Signed)
 Jerry [name] would like to wait until his next PET or CT scan at the end of February before making any follow up appointment.

## 2024-03-05 ENCOUNTER — Inpatient Hospital Stay

## 2024-03-12 ENCOUNTER — Inpatient Hospital Stay: Attending: Internal Medicine | Admitting: Internal Medicine
